# Patient Record
Sex: Female | Born: 1965 | Race: White | Hispanic: No | Marital: Married | Smoking: Never smoker
Health system: Southern US, Community
[De-identification: ages and names within clinical notes are randomized; demographics above are authoritative.]

## PROBLEM LIST (undated history)

## (undated) DIAGNOSIS — Z8041 Family history of malignant neoplasm of ovary: Secondary | ICD-10-CM

## (undated) DIAGNOSIS — F319 Bipolar disorder, unspecified: Secondary | ICD-10-CM

## (undated) DIAGNOSIS — Z803 Family history of malignant neoplasm of breast: Secondary | ICD-10-CM

## (undated) DIAGNOSIS — T7840XA Allergy, unspecified, initial encounter: Secondary | ICD-10-CM

## (undated) DIAGNOSIS — K635 Polyp of colon: Secondary | ICD-10-CM

## (undated) DIAGNOSIS — Z923 Personal history of irradiation: Secondary | ICD-10-CM

## (undated) DIAGNOSIS — Z8042 Family history of malignant neoplasm of prostate: Secondary | ICD-10-CM

## (undated) DIAGNOSIS — Z8052 Family history of malignant neoplasm of bladder: Secondary | ICD-10-CM

## (undated) DIAGNOSIS — Z0000 Encounter for general adult medical examination without abnormal findings: Secondary | ICD-10-CM

## (undated) DIAGNOSIS — E039 Hypothyroidism, unspecified: Secondary | ICD-10-CM

## (undated) DIAGNOSIS — J45909 Unspecified asthma, uncomplicated: Secondary | ICD-10-CM

## (undated) DIAGNOSIS — Z124 Encounter for screening for malignant neoplasm of cervix: Secondary | ICD-10-CM

## (undated) DIAGNOSIS — E079 Disorder of thyroid, unspecified: Secondary | ICD-10-CM

## (undated) DIAGNOSIS — F419 Anxiety disorder, unspecified: Secondary | ICD-10-CM

## (undated) DIAGNOSIS — Z86010 Personal history of colonic polyps: Secondary | ICD-10-CM

## (undated) HISTORY — PX: SKIN BIOPSY: SHX1

## (undated) HISTORY — PX: APPENDECTOMY: SHX54

## (undated) HISTORY — DX: Family history of malignant neoplasm of breast: Z80.3

## (undated) HISTORY — DX: Encounter for screening for malignant neoplasm of cervix: Z12.4

## (undated) HISTORY — DX: Family history of malignant neoplasm of prostate: Z80.42

## (undated) HISTORY — DX: Family history of malignant neoplasm of bladder: Z80.52

## (undated) HISTORY — DX: Polyp of colon: K63.5

## (undated) HISTORY — DX: Family history of malignant neoplasm of ovary: Z80.41

## (undated) HISTORY — DX: Personal history of colonic polyps: Z86.010

## (undated) HISTORY — DX: Encounter for general adult medical examination without abnormal findings: Z00.00

---

## 2000-12-18 ENCOUNTER — Inpatient Hospital Stay (HOSPITAL_COMMUNITY): Admission: EM | Admit: 2000-12-18 | Discharge: 2000-12-24 | Payer: Self-pay | Admitting: Psychiatry

## 2001-06-28 ENCOUNTER — Other Ambulatory Visit: Admission: RE | Admit: 2001-06-28 | Discharge: 2001-06-28 | Payer: Self-pay | Admitting: Obstetrics and Gynecology

## 2001-09-15 ENCOUNTER — Emergency Department (HOSPITAL_COMMUNITY): Admission: EM | Admit: 2001-09-15 | Discharge: 2001-09-15 | Payer: Self-pay | Admitting: Emergency Medicine

## 2002-02-18 ENCOUNTER — Encounter: Payer: Self-pay | Admitting: Internal Medicine

## 2002-02-18 ENCOUNTER — Encounter: Admission: RE | Admit: 2002-02-18 | Discharge: 2002-02-18 | Payer: Self-pay | Admitting: Internal Medicine

## 2002-02-28 ENCOUNTER — Encounter: Payer: Self-pay | Admitting: Otolaryngology

## 2002-02-28 ENCOUNTER — Ambulatory Visit (HOSPITAL_COMMUNITY): Admission: RE | Admit: 2002-02-28 | Discharge: 2002-02-28 | Payer: Self-pay | Admitting: Otolaryngology

## 2002-11-14 ENCOUNTER — Encounter: Admission: RE | Admit: 2002-11-14 | Discharge: 2002-11-14 | Payer: Self-pay | Admitting: Internal Medicine

## 2003-04-27 ENCOUNTER — Encounter: Admission: RE | Admit: 2003-04-27 | Discharge: 2003-04-27 | Payer: Self-pay | Admitting: Internal Medicine

## 2003-10-30 ENCOUNTER — Encounter: Admission: RE | Admit: 2003-10-30 | Discharge: 2003-10-30 | Payer: Self-pay | Admitting: Internal Medicine

## 2005-03-22 ENCOUNTER — Encounter: Admission: RE | Admit: 2005-03-22 | Discharge: 2005-03-22 | Payer: Self-pay | Admitting: Internal Medicine

## 2005-04-07 ENCOUNTER — Ambulatory Visit (HOSPITAL_COMMUNITY): Admission: RE | Admit: 2005-04-07 | Discharge: 2005-04-07 | Payer: Self-pay | Admitting: Internal Medicine

## 2005-09-09 ENCOUNTER — Emergency Department (HOSPITAL_COMMUNITY): Admission: EM | Admit: 2005-09-09 | Discharge: 2005-09-09 | Payer: Self-pay | Admitting: Emergency Medicine

## 2009-10-29 ENCOUNTER — Encounter: Admission: RE | Admit: 2009-10-29 | Discharge: 2009-10-29 | Payer: Self-pay | Admitting: Family Medicine

## 2010-02-06 ENCOUNTER — Encounter: Payer: Self-pay | Admitting: Allergy

## 2010-02-06 ENCOUNTER — Encounter: Payer: Self-pay | Admitting: Family Medicine

## 2010-06-03 NOTE — H&P (Signed)
[facility]  Patient:    Cheyenne Garner, Cheyenne Garner Visit Number: [id number] MRN: [id number]          Service Type: PSY Location: 400 0400 02 Attending Physician:  Rachael Fee Dictated by:   Candi Leash. Orsini, N.P. Admit Date:  12/18/2000                     Psychiatric Admission Assessment  PATIENT IDENTIFICATION:  This is a 45 year old married white female involuntarily committed for mania and psychosis.  HISTORY OF PRESENT ILLNESS:  The patient presents with a history of manic behavior.  Yesterday the patient states she got in her pajamas.  She went out and was singing Loralie Champagne to her neighbors.  She states her neighbors were very supportive.  The patient states she has been hearing voices telling her to get better.  She has a history of depressive symptoms.  The patient denies any suicidal or homicidal ideation.  She does feel like she lost control yesterday.  She reports she has not been sleeping for the past two nights.  Her appetite has been satisfactory.  The patient feels very afraid for the past few days, recently moved from [place] to [place] due to husbands job change.  The patient wants to go back to [place].  She reports that she has been compliant with her medications.  She does report that she feels very anxious and has been picking at herself.  PAST PSYCHIATRIC HISTORY:  First visit to [facility], last hospitalization was about four months after she delivered for psychosis.  She was seeing Dr. Darin Engels in [place] and the patient has a history of bipolar disorder that was diagnosed about four years ago.  SUBSTANCE ABUSE HISTORY:  Nonsmoker, denies any alcohol or drug use.  PAST MEDICAL HISTORY:  Primary care Trejuan Matherne: At present is none.  Past psychiatrist is Dr. Darin Engels; phone number [phone].  Medical problems: None.  MEDICATIONS: 1. Prozac 20 mg q.h.s. 2. Eskalith 450 mg b.i.d.  DRUG ALLERGIES:   ERYTHROMYCIN.  PHYSICAL EXAMINATION:  GENERAL:  Performed at [facility] Emergency Department.  LABORATORY DATA:  CBC and CMET were within normal limits.  SOCIAL HISTORY:  She is a 45 year old married white female, married for 13 years.  She has a 30-year-old child.  She lives with her husband and child. She is unemployed, no legal problems.  She moved to [place] two months ago from [place].  FAMILY HISTORY:  None.  MENTAL STATUS EXAMINATION:  She is alert, young middle-aged, dressed in night wear.  She is lying in bed.  She is cooperative with good eye contact.  Speech is normal and relevant.  Mood is depressed.  Affect is labile  Thought processes: Positive paranoia, no auditory or visual hallucinations, no suicidal or homicidal ideations.  The patient feels confused and uncertain if she is giving me right answers.  Cognitive: Intact.  Judgment is fair. Insight is fair.  She appears sincere.  ADMISSION DIAGNOSES: Axis I:    Bipolar disorder, hypomanic. Axis II:   Deferred. Axis III:  Recent upper respiratory infection. Axis IV:   Recent relocation. Axis V:    Current is 25, estimated this past year is 71.  INITIAL PLAN OF CARE:  Plan is an involuntary commitment to [facility] for psychosis.  Contract for safety.  Check every 15 minutes.  Will put the patient on a locked hall.   Will resume her routine medications.  Will obtain a  lithium level, other labs as indicated, and a urine pregnancy test. Will add Zyprexa and have Klonopin for anxiety and discontinue her Prozac. Will consider a family session.  Goal is to stabilize her mood and thinking so the patient can be safe, to follow up with mental health and private psychiatrist, for the patient to medication compliant.  ESTIMATED LENGTH OF STAY:  Three to five days. Dictated by:   Candi Leash. Orsini, N.P. Attending Physician:  Rachael Fee DD:  12/21/00 TD:  12/21/00 Job: [id number]

## 2010-06-03 NOTE — Discharge Summary (Signed)
[facility]  Patient:    Cheyenne Garner, Cheyenne Garner Visit Number: [id number] MRN: [id number]          Service Type: PSY Location: 400 0400 02 Attending Physician:  Rachael Fee Dictated by:   Reymundo Poll Dub Mikes, M.D. Admit Date:  12/18/2000 Discharge Date: 12/24/2000                             Discharge Summary  CHIEF COMPLAINT AND PRESENT ILLNESS:  This was the first admission to [facility] for this 45 year old female involuntarily committed for mania and psychosis.  History of manic behavior.  She got into her pajamas. She went out and was singing Loralie Champagne to her neighbors.  She stated her neighbors were very supportive.  The patient said she had been hearing voices telling her to get better.  Has a history of depressive symptoms.  The patient denies any suicidal ideation.  She has been sleeping for the past two nights prior to this admission.  Her appetite has been satisfactory, very afraid for the last few days, recently moved from [place] to [place] due to husbands job change.  The patient wants to go back to [place].  She reports that she has been compliant with her medications.  She does report that she feels very anxious.  She has been picking at herself.  PAST PSYCHIATRIC HISTORY:  First time at [facility].  Last hospitalization was about four months after she delivered for psychosis.  Was seeing a Dr. ________ in [place].  Past history of bipolar disorder.  SUBSTANCE ABUSE HISTORY:  Denies the use or abuse of any substances.  PAST MEDICAL HISTORY:  Noncontributory.  MEDICATIONS:  Upon admission, she was taking Prozac 20 mg at bedtime and Eskalith CR 450 mg per day.  MENTAL STATUS EXAMINATION:  Alert, young female dressed in night-wear.  She is lying in bed.  Cooperative.  Good eye contact.  Speech was normal and relevant.  Mood was depressed.  Affect was labile.  Thought process positive for paranoia.  No  auditory or visual hallucinations.  No suicidal or homicidal ideation.  Feels confused and it is uncertain that she is giving the right answers.  Cognition well-preserved.  ADMISSION DIAGNOSES: Axis I:    Bipolar disorder, hypomanic. Axis II:   Deferred. Axis III:  Recent upper respiratory tract infection. Axis IV:   Moderate. Axis V:    Global Assessment of Functioning upon admission 25; highest Global            Assessment of Functioning in the last year 69.  LABORATORY DATA:  Blood chemistries were within normal limits.  CBC was within normal limits.  Thyroid profile was within normal limits.  Lithium level initially 0.59; went up to 0.75 towards the end of the hospitalization.  HOSPITAL COURSE:  As she was started on intensive individual and group psychotherapy, her behavior was pretty erratic at times, very expansive, inappropriate, childlike behavior, needing to be redirected.  She wanted to continue lithium, did not want to pursue the Depakote as it made her gain weight.  Her medications were adjusted.  She was given Zyprexa and the lithium was increased.  Eskalith CR 450 mg in the morning and 675 mg at bedtime. Gradually, she came down.  Her mood improved.  Her affect was brighter.  We had a family session with the husband and that went well.  He was supportive but there was  an issue of her being upset because she had lied about her taking the medication.  On December 9th, much improved.  No more regressed episodes.  Cooperative and in full contact with reality.  Euthymic mood. Affect broad, bright.  Willing and motivated to pursue further outpatient follow-up and continue her medication.  DISCHARGE DIAGNOSES: Axis I:    Bipolar disorder, hypomanic. Axis II:   No diagnosis. Axis III:  Respiratory tract infection, resolved. Axis IV:   Moderate. Axis V:    Global Assessment of Functioning upon discharge 55-60.  DISCHARGE MEDICATIONS: 1. Klonopin 0.5 mg twice a day as  needed for anxiety. 2. Zyprexa 5 mg at bedtime. 3. Eskalith CR 450 mg, 1 in the morning, 1-1/2 at bedtime.  FOLLOW-UP:  Will be followed by Dr. Nolen Mu. Dictated by:   Reymundo Poll Dub Mikes, M.D. Attending Physician:  Rachael Fee DD:  01/23/01 TD:  01/25/01 Job: [id number]

## 2010-10-28 ENCOUNTER — Other Ambulatory Visit: Payer: Self-pay | Admitting: Internal Medicine

## 2010-10-28 DIAGNOSIS — Z1231 Encounter for screening mammogram for malignant neoplasm of breast: Secondary | ICD-10-CM

## 2010-11-01 ENCOUNTER — Ambulatory Visit: Payer: Self-pay

## 2010-11-29 ENCOUNTER — Ambulatory Visit
Admission: RE | Admit: 2010-11-29 | Discharge: 2010-11-29 | Disposition: A | Discharge status: Home / Self Care | Payer: BC Managed Care – PPO | Source: Ambulatory Visit | Attending: Internal Medicine | Admitting: Internal Medicine

## 2010-11-29 DIAGNOSIS — Z1231 Encounter for screening mammogram for malignant neoplasm of breast: Secondary | ICD-10-CM

## 2011-12-25 DIAGNOSIS — R32 Unspecified urinary incontinence: Secondary | ICD-10-CM | POA: Insufficient documentation

## 2012-02-22 ENCOUNTER — Other Ambulatory Visit: Payer: Self-pay | Admitting: Nurse Practitioner

## 2012-02-22 DIAGNOSIS — Z1231 Encounter for screening mammogram for malignant neoplasm of breast: Secondary | ICD-10-CM

## 2012-02-29 ENCOUNTER — Ambulatory Visit: Payer: BC Managed Care – PPO

## 2012-03-07 ENCOUNTER — Ambulatory Visit: Payer: BC Managed Care – PPO

## 2014-06-25 ENCOUNTER — Emergency Department (HOSPITAL_BASED_OUTPATIENT_CLINIC_OR_DEPARTMENT_OTHER)
Admission: EM | Admit: 2014-06-25 | Discharge: 2014-06-25 | Disposition: A | Discharge status: Home / Self Care | Payer: BLUE CROSS/BLUE SHIELD | Attending: Emergency Medicine | Admitting: Emergency Medicine

## 2014-06-25 ENCOUNTER — Emergency Department (HOSPITAL_BASED_OUTPATIENT_CLINIC_OR_DEPARTMENT_OTHER): Payer: BLUE CROSS/BLUE SHIELD

## 2014-06-25 ENCOUNTER — Encounter (HOSPITAL_BASED_OUTPATIENT_CLINIC_OR_DEPARTMENT_OTHER): Payer: Self-pay | Admitting: *Deleted

## 2014-06-25 DIAGNOSIS — R1013 Epigastric pain: Secondary | ICD-10-CM | POA: Insufficient documentation

## 2014-06-25 DIAGNOSIS — Z79899 Other long term (current) drug therapy: Secondary | ICD-10-CM | POA: Insufficient documentation

## 2014-06-25 DIAGNOSIS — J45909 Unspecified asthma, uncomplicated: Secondary | ICD-10-CM | POA: Diagnosis not present

## 2014-06-25 DIAGNOSIS — F319 Bipolar disorder, unspecified: Secondary | ICD-10-CM | POA: Diagnosis not present

## 2014-06-25 DIAGNOSIS — R1011 Right upper quadrant pain: Secondary | ICD-10-CM | POA: Diagnosis not present

## 2014-06-25 DIAGNOSIS — Z3202 Encounter for pregnancy test, result negative: Secondary | ICD-10-CM | POA: Insufficient documentation

## 2014-06-25 DIAGNOSIS — E079 Disorder of thyroid, unspecified: Secondary | ICD-10-CM | POA: Diagnosis not present

## 2014-06-25 DIAGNOSIS — Z7951 Long term (current) use of inhaled steroids: Secondary | ICD-10-CM | POA: Diagnosis not present

## 2014-06-25 HISTORY — DX: Unspecified asthma, uncomplicated: J45.909

## 2014-06-25 HISTORY — DX: Disorder of thyroid, unspecified: E07.9

## 2014-06-25 HISTORY — DX: Bipolar disorder, unspecified: F31.9

## 2014-06-25 LAB — URINE MICROSCOPIC-ADD ON

## 2014-06-25 LAB — URINALYSIS, ROUTINE W REFLEX MICROSCOPIC
Bilirubin Urine: NEGATIVE
GLUCOSE, UA: NEGATIVE mg/dL
KETONES UR: NEGATIVE mg/dL
NITRITE: NEGATIVE
PROTEIN: NEGATIVE mg/dL
Specific Gravity, Urine: 1.029 (ref 1.005–1.030)
UROBILINOGEN UA: 0.2 mg/dL (ref 0.0–1.0)
pH: 5.5 (ref 5.0–8.0)

## 2014-06-25 LAB — CBC WITH DIFFERENTIAL/PLATELET
Basophils Absolute: 0 10*3/uL (ref 0.0–0.1)
Basophils Relative: 0 % (ref 0–1)
Eosinophils Absolute: 0.1 10*3/uL (ref 0.0–0.7)
Eosinophils Relative: 1 % (ref 0–5)
HCT: 39.8 % (ref 36.0–46.0)
Hemoglobin: 13 g/dL (ref 12.0–15.0)
Lymphocytes Relative: 12 % (ref 12–46)
Lymphs Abs: 1.4 10*3/uL (ref 0.7–4.0)
MCH: 29.5 pg (ref 26.0–34.0)
MCHC: 32.7 g/dL (ref 30.0–36.0)
MCV: 90.5 fL (ref 78.0–100.0)
Monocytes Absolute: 0.8 10*3/uL (ref 0.1–1.0)
Monocytes Relative: 7 % (ref 3–12)
Neutro Abs: 9.3 10*3/uL — ABNORMAL HIGH (ref 1.7–7.7)
Neutrophils Relative %: 80 % — ABNORMAL HIGH (ref 43–77)
Platelets: 122 10*3/uL — ABNORMAL LOW (ref 150–400)
RBC: 4.4 MIL/uL (ref 3.87–5.11)
RDW: 12.7 % (ref 11.5–15.5)
WBC: 11.6 10*3/uL — ABNORMAL HIGH (ref 4.0–10.5)

## 2014-06-25 LAB — LITHIUM LEVEL: Lithium Lvl: 0.38 mmol/L — ABNORMAL LOW (ref 0.60–1.20)

## 2014-06-25 LAB — COMPREHENSIVE METABOLIC PANEL
ALT: 17 U/L (ref 14–54)
AST: 16 U/L (ref 15–41)
Albumin: 4.3 g/dL (ref 3.5–5.0)
Alkaline Phosphatase: 61 U/L (ref 38–126)
Anion gap: 5 (ref 5–15)
BUN: 20 mg/dL (ref 6–20)
CO2: 29 mmol/L (ref 22–32)
Calcium: 9.4 mg/dL (ref 8.9–10.3)
Chloride: 102 mmol/L (ref 101–111)
Creatinine, Ser: 0.87 mg/dL (ref 0.44–1.00)
GFR calc Af Amer: >60 mL/min (ref >60)
GFR calc non Af Amer: >60 mL/min (ref >60)
Glucose, Bld: 106 mg/dL — ABNORMAL HIGH (ref 65–99)
Potassium: 3.7 mmol/L (ref 3.5–5.1)
Sodium: 136 mmol/L (ref 135–145)
Total Bilirubin: 0.7 mg/dL (ref 0.3–1.2)
Total Protein: 7.2 g/dL (ref 6.5–8.1)

## 2014-06-25 LAB — LIPASE, BLOOD: Lipase: 20 U/L — ABNORMAL LOW (ref 22–51)

## 2014-06-25 LAB — TROPONIN I

## 2014-06-25 LAB — PREGNANCY, URINE: Preg Test, Ur: NEGATIVE

## 2014-06-25 MED ORDER — KETOROLAC TROMETHAMINE 30 MG/ML IJ SOLN
30.0000 mg | Freq: Once | INTRAMUSCULAR | Status: AC
  Administered 2014-06-25: 30 mg via INTRAVENOUS
  Filled 2014-06-25: qty 1

## 2014-06-25 MED ORDER — HYDROCODONE-ACETAMINOPHEN 5-325 MG PO TABS: 1.0000 | ORAL_TABLET | ORAL | Status: DC | PRN

## 2014-06-25 MED ORDER — PANTOPRAZOLE SODIUM 40 MG PO TBEC: 40.0000 mg | DELAYED_RELEASE_TABLET | Freq: Every day | ORAL | Status: DC

## 2014-06-25 MED ORDER — SODIUM CHLORIDE 0.9 % IV SOLN
INTRAVENOUS | Status: DC
  Administered 2014-06-25: 20:00:00 via INTRAVENOUS

## 2014-06-25 MED ORDER — ONDANSETRON 4 MG PO TBDP: 4.0000 mg | ORAL_TABLET | Freq: Three times a day (TID) | ORAL | Status: DC | PRN

## 2014-06-25 NOTE — ED Notes (Signed)
MD at bedside. 

## 2014-06-25 NOTE — Discharge Instructions (Signed)
Gastritis, Adult Gastritis is soreness and swelling (inflammation) of the lining of the stomach. Gastritis can develop as a sudden onset (acute) or long-term (chronic) condition. If gastritis is not treated, it can lead to stomach bleeding and ulcers. CAUSES  Gastritis occurs when the stomach lining is weak or damaged. Digestive juices from the stomach then inflame the weakened stomach lining. The stomach lining may be weak or damaged due to viral or bacterial infections. One common bacterial infection is the Helicobacter pylori infection. Gastritis can also result from excessive alcohol consumption, taking certain medicines, or having too much acid in the stomach.  SYMPTOMS  In some cases, there are no symptoms. When symptoms are present, they may include:  Pain or a burning sensation in the upper abdomen.  Nausea.  Vomiting.  An uncomfortable feeling of fullness after eating. DIAGNOSIS  Your caregiver may suspect you have gastritis based on your symptoms and a physical exam. To determine the cause of your gastritis, your caregiver may perform the following:  Blood or stool tests to check for the H pylori bacterium.  Gastroscopy. A thin, flexible tube (endoscope) is passed down the esophagus and into the stomach. The endoscope has a light and camera on the end. Your caregiver uses the endoscope to view the inside of the stomach.  Taking a tissue sample (biopsy) from the stomach to examine under a microscope. TREATMENT  Depending on the cause of your gastritis, medicines may be prescribed. If you have a bacterial infection, such as an H pylori infection, antibiotics may be given. If your gastritis is caused by too much acid in the stomach, H2 blockers or antacids may be given. Your caregiver may recommend that you stop taking aspirin, ibuprofen, or other nonsteroidal anti-inflammatory drugs (NSAIDs). HOME CARE INSTRUCTIONS  Only take over-the-counter or prescription medicines as directed by  your caregiver.  If you were given antibiotic medicines, take them as directed. Finish them even if you start to feel better.  Drink enough fluids to keep your urine clear or pale yellow.  Avoid foods and drinks that make your symptoms worse, such as:  Caffeine or alcoholic drinks.  Chocolate.  Peppermint or mint flavorings.  Garlic and onions.  Spicy foods.  Citrus fruits, such as oranges, lemons, or limes.  Tomato-based foods such as sauce, chili, salsa, and pizza.  Fried and fatty foods.  Eat small, frequent meals instead of large meals. SEEK IMMEDIATE MEDICAL CARE IF:   You have black or dark red stools.  You vomit blood or material that looks like coffee grounds.  You are unable to keep fluids down.  Your abdominal pain gets worse.  You have a fever.  You do not feel better after 1 week.  You have any other questions or concerns. MAKE SURE YOU:  Understand these instructions.  Will watch your condition.  Will get help right away if you are not doing well or get worse. Document Released: 12/27/2000 Document Revised: 07/04/2011 Document Reviewed: 02/15/2011 [facility] Patient Information 2015 [place], [place]. This information is not intended to replace advice given to you by your health care provider. Make sure you discuss any questions you have with your health care provider.    Food Choices for Gastroesophageal Reflux Disease When you have gastroesophageal reflux disease (GERD), the foods you eat and your eating habits are very important. Choosing the right foods can help ease the discomfort of GERD. WHAT GENERAL GUIDELINES DO I NEED TO FOLLOW?  Choose fruits, vegetables, whole grains, low-fat dairy products,  and low-fat meat, fish, and poultry.  Limit fats such as oils, salad dressings, butter, nuts, and avocado.  Keep a food diary to identify foods that cause symptoms.  Avoid foods that cause reflux. These may be different for different  people.  Eat frequent small meals instead of three large meals each day.  Eat your meals slowly, in a relaxed setting.  Limit fried foods.  Cook foods using methods other than frying.  Avoid drinking alcohol.  Avoid drinking large amounts of liquids with your meals.  Avoid NSAIDs such as aspirin, ibuprofen, Aleve.  Avoid bending over or lying down until 2-3 hours after eating. WHAT FOODS ARE NOT RECOMMENDED? The following are some foods and drinks that may worsen your symptoms: Vegetables Tomatoes. Tomato juice. Tomato and spaghetti sauce. Chili peppers. Onion and garlic. Horseradish. Fruits Oranges, grapefruit, and lemon (fruit and juice). Meats High-fat meats, fish, and poultry. This includes hot dogs, ribs, ham, sausage, salami, and bacon. Dairy Whole milk and chocolate milk. Sour cream. Cream. Butter. Ice cream. Cream cheese.  Beverages Coffee and tea, with or without caffeine. Carbonated beverages or energy drinks. Condiments Hot sauce. Barbecue sauce.  Sweets/Desserts Chocolate and cocoa. Donuts. Peppermint and spearmint. Fats and Oils High-fat foods, including [place] fries and potato chips. Other Vinegar. Strong spices, such as black pepper, white pepper, red pepper, cayenne, curry powder, cloves, ginger, and chili powder. The items listed above may not be a complete list of foods and beverages to avoid. Contact your dietitian for more information. Document Released: 01/02/2005 Document Revised: 01/07/2013 Document Reviewed: 11/06/2012 [facility] Patient Information 2015 [place], [place]. This information is not intended to replace advice given to you by your health care provider. Make sure you discuss any questions you have with your health care provider.

## 2014-06-25 NOTE — ED Notes (Signed)
Presents with epigastric pain, radiates to rt side of neck and rt shoulder. Onset approx 5pm today. Denies SOB, also denies any N/V. States while sitting pain is now beginning to decrease.

## 2014-06-25 NOTE — ED Provider Notes (Signed)
TIME SEEN: 7:15 PM  CHIEF COMPLAINT: Abdominal pain  HPI: Pt is a 49 y.o. female with history of hypothyroidism, bipolar disorder on lithium, asthma who presents to the emergency department with epigastric and right upper quadrant abdominal pain that started at 5 PM. Pain radiates into the right shoulder. No chest pain or shortness of breath. No nausea, vomiting or diarrhea. No bloody stools or melena. No dysuria, hematuria, vaginal bleeding or discharge. Last menstrual period was May 2015. She has had a previous appendectomy but no other abdominal surgeries. She states that she has pain similar to this the past after eating fatty meals and thought that this was her gallbladder but has never had an ultrasound of her gallbladder. Denies history of peptic ulcer disease or endoscopy. States she did eat an omelette last night as well as home fries and custard. She did not have pain after this meal. States for breakfast this morning she ate granola and then at lunch she ate tuna and carrots.  ROS: See HPI Constitutional: no fever  Eyes: no drainage  ENT: no runny nose   Cardiovascular:  no chest pain  Resp: no SOB  GI: no vomiting GU: no dysuria Integumentary: no rash  Allergy: no hives  Musculoskeletal: no leg swelling  Neurological: no slurred speech ROS otherwise negative  PAST MEDICAL HISTORY/PAST SURGICAL HISTORY:  Past Medical History  Diagnosis Date  . Thyroid disease   . Bipolar 1 disorder   . Asthma     MEDICATIONS:  Prior to Admission medications   Medication Sig Start Date End Date Taking? Authorizing Provider  beclomethasone (QVAR) 80 MCG/ACT inhaler Inhale 2 puffs into the lungs 2 (two) times daily.   Yes Historical Provider, MD  budesonide-formoterol (SYMBICORT) 160-4.5 MCG/ACT inhaler Inhale 2 puffs into the lungs 2 (two) times daily.   Yes Historical Provider, MD  levothyroxine (SYNTHROID, LEVOTHROID) 125 MCG tablet Take 125 mcg by mouth daily before breakfast.   Yes  Historical Provider, MD  lithium 300 MG tablet Take 300 mg by mouth 2 (two) times daily.   Yes Historical Provider, MD    ALLERGIES:  No Known Allergies  SOCIAL HISTORY:  History  Substance Use Topics  . Smoking status: Never Smoker   . Smokeless tobacco: Not on file  . Alcohol Use: No    FAMILY HISTORY: History reviewed. No pertinent family history.  EXAM: BP 129/81 mmHg  Pulse 77  Temp(Src) 98.3 F (36.8 C) (Oral)  Resp 16  Ht 5' 6.5" (1.689 m)  Wt 140 lb (63.504 kg)  BMI 22.26 kg/m2  SpO2 100% CONSTITUTIONAL: Alert and oriented and responds appropriately to questions. Well-appearing; well-nourished HEAD: Normocephalic EYES: Conjunctivae clear, PERRL ENT: normal nose; no rhinorrhea; moist mucous membranes; pharynx without lesions noted NECK: Supple, no meningismus, no LAD  CARD: RRR; S1 and S2 appreciated; no murmurs, no clicks, no rubs, no gallops RESP: Normal chest excursion without splinting or tachypnea; breath sounds clear and equal bilaterally; no wheezes, no rhonchi, no rales, no hypoxia or respiratory distress, speaking full sentences ABD/GI: Normal bowel sounds; non-distended; soft, tender to palpation in the epigastric region and right upper quadrant with minimal voluntary guarding, no rebound, no peritoneal signs, negative Murphy sign BACK:  The back appears normal and is non-tender to palpation, there is no CVA tenderness EXT: Normal ROM in all joints; non-tender to palpation; no edema; normal capillary refill; no cyanosis, no calf tenderness or swelling    SKIN: Normal color for age and race; warm NEURO: Moves  all extremities equally, sensation to light touch intact diffusely, cranial nerves II through XII intact PSYCH: The patient's mood and manner are appropriate. Grooming and personal hygiene are appropriate.  MEDICAL DECISION MAKING: Patient here with abdominal pain and epigastric region and right upper quadrant. She is afebrile, nontoxic appearing, in no  distress. Will obtain abdominal labs, urine and a right upper quadrant ultrasound. Differential includes cholelithiasis, cholecystitis, pancreatitis, gastritis, GERD, peptic ulcer. Patient does not want narcotic pain medication at this time. We'll give Toradol.  ED PROGRESS: Patient's labs show mild leukocytosis of 11.6 with left shift. LFTs, lipase normal. Troponin negative. Urine pregnancy negative. Urine shows trace hemoglobin small leukocytes but rare bacteria and she is not having any urinary symptoms. Her abdominal ultrasound shows no acute abnormality. Negative for cholelithiasis or cholecystitis. Suspect this may be GERD, gastritis. We'll start her on Protonix and have recommended outpatient follow-up with gastroenterology. Discussed return precautions. She verbalized understanding and is comfortable with plan. She reports feeling much better and now her pain is a 1/10. She is safe to be discharged home and does not need any further emergent workup.    EKG Interpretation  Date/Time:  Thursday June 25 2014 19:31:28 EDT Ventricular Rate:  74 PR Interval:  162 QRS Duration: 106 QT Interval:  372 QTC Calculation: 412 R Axis:   85 Text Interpretation:  Normal sinus rhythm Incomplete right bundle branch block Borderline ECG No old tracing to compare Confirmed by WARD,  DO, KRISTEN [phone]) on 06/25/2014 7:37:15 PM          [place], DO 06/25/14 2125

## 2014-06-25 NOTE — ED Notes (Signed)
Pt c/o epigastric pain that radiates through to back   X 4 hrs

## 2014-07-06 ENCOUNTER — Other Ambulatory Visit (HOSPITAL_COMMUNITY): Payer: Self-pay | Admitting: Physician Assistant

## 2014-07-06 DIAGNOSIS — R1011 Right upper quadrant pain: Secondary | ICD-10-CM

## 2014-07-06 DIAGNOSIS — R1013 Epigastric pain: Secondary | ICD-10-CM

## 2014-07-13 ENCOUNTER — Encounter (HOSPITAL_COMMUNITY): Payer: BLUE CROSS/BLUE SHIELD

## 2014-07-24 ENCOUNTER — Ambulatory Visit (HOSPITAL_COMMUNITY)
Admission: RE | Admit: 2014-07-24 | Discharge: 2014-07-24 | Disposition: A | Discharge status: Home / Self Care | Payer: BLUE CROSS/BLUE SHIELD | Source: Ambulatory Visit | Attending: Physician Assistant | Admitting: Physician Assistant

## 2014-07-24 DIAGNOSIS — R1011 Right upper quadrant pain: Secondary | ICD-10-CM | POA: Diagnosis present

## 2014-07-24 DIAGNOSIS — R1013 Epigastric pain: Secondary | ICD-10-CM | POA: Insufficient documentation

## 2014-07-24 MED ORDER — STERILE WATER FOR INJECTION IJ SOLN
INTRAMUSCULAR | Status: AC
  Filled 2014-07-24: qty 10

## 2014-07-24 MED ORDER — SINCALIDE 5 MCG IJ SOLR
0.0200 ug/kg | Freq: Once | INTRAMUSCULAR | Status: AC
  Administered 2014-07-24: 1.27 ug via INTRAVENOUS

## 2014-07-24 MED ORDER — SINCALIDE 5 MCG IJ SOLR
INTRAMUSCULAR | Status: AC
  Filled 2014-07-24: qty 5

## 2014-07-24 MED ORDER — TECHNETIUM TC 99M SULFUR COLLOID FILTERED
1.0000 | Freq: Once | INTRAVENOUS | Status: AC | PRN
Start: 2014-07-24 — End: 2014-07-24
  Administered 2014-07-24: 1 via INTRADERMAL

## 2015-05-17 DIAGNOSIS — H524 Presbyopia: Secondary | ICD-10-CM | POA: Diagnosis not present

## 2015-05-17 DIAGNOSIS — H5319 Other subjective visual disturbances: Secondary | ICD-10-CM | POA: Diagnosis not present

## 2015-06-21 DIAGNOSIS — F3173 Bipolar disorder, in partial remission, most recent episode manic: Secondary | ICD-10-CM | POA: Diagnosis not present

## 2015-07-22 ENCOUNTER — Telehealth: Payer: Self-pay | Admitting: *Deleted

## 2015-07-22 NOTE — Telephone Encounter (Signed)
error:[id number] ° °

## 2015-07-22 NOTE — Telephone Encounter (Signed)
Unable to reach patient at time of Pre-Visit Call.  Left message for patient to return call when available.    

## 2015-07-23 ENCOUNTER — Encounter: Payer: Self-pay | Admitting: Family Medicine

## 2015-07-23 ENCOUNTER — Ambulatory Visit (INDEPENDENT_AMBULATORY_CARE_PROVIDER_SITE_OTHER): Payer: BLUE CROSS/BLUE SHIELD | Admitting: Family Medicine

## 2015-07-23 ENCOUNTER — Ambulatory Visit: Payer: BLUE CROSS/BLUE SHIELD | Admitting: Family Medicine

## 2015-07-23 ENCOUNTER — Encounter (INDEPENDENT_AMBULATORY_CARE_PROVIDER_SITE_OTHER): Payer: Self-pay

## 2015-07-23 VITALS — BP 120/78 | HR 79 | Temp 98.1°F | Ht 66.5 in | Wt 151.0 lb

## 2015-07-23 DIAGNOSIS — Z0000 Encounter for general adult medical examination without abnormal findings: Secondary | ICD-10-CM | POA: Diagnosis not present

## 2015-07-23 DIAGNOSIS — F319 Bipolar disorder, unspecified: Secondary | ICD-10-CM | POA: Diagnosis not present

## 2015-07-23 DIAGNOSIS — J45909 Unspecified asthma, uncomplicated: Secondary | ICD-10-CM | POA: Diagnosis not present

## 2015-07-23 DIAGNOSIS — Z1239 Encounter for other screening for malignant neoplasm of breast: Secondary | ICD-10-CM | POA: Diagnosis not present

## 2015-07-23 DIAGNOSIS — E079 Disorder of thyroid, unspecified: Secondary | ICD-10-CM | POA: Insufficient documentation

## 2015-07-23 HISTORY — DX: Encounter for general adult medical examination without abnormal findings: Z00.00

## 2015-07-23 LAB — CBC
HCT: 41.7 % (ref 36.0–46.0)
Hemoglobin: 14 g/dL (ref 12.0–15.0)
MCHC: 33.6 g/dL (ref 30.0–36.0)
MCV: 88.4 fl (ref 78.0–100.0)
Platelets: 163 10*3/uL (ref 150.0–400.0)
RBC: 4.71 Mil/uL (ref 3.87–5.11)
RDW: 12.7 % (ref 11.5–15.5)
WBC: 5 10*3/uL (ref 4.0–10.5)

## 2015-07-23 LAB — COMPREHENSIVE METABOLIC PANEL
ALT: 14 U/L (ref 0–35)
AST: 16 U/L (ref 0–37)
Albumin: 4.2 g/dL (ref 3.5–5.2)
Alkaline Phosphatase: 65 U/L (ref 39–117)
BILIRUBIN TOTAL: 0.7 mg/dL (ref 0.2–1.2)
BUN: 18 mg/dL (ref 6–23)
CO2: 33 meq/L — AB (ref 19–32)
Calcium: 9.7 mg/dL (ref 8.4–10.5)
Chloride: 104 mEq/L (ref 96–112)
Creatinine, Ser: 0.89 mg/dL (ref 0.40–1.20)
GFR: 71.24 mL/min (ref >60.00)
GLUCOSE: 85 mg/dL (ref 70–99)
Potassium: 4.3 mEq/L (ref 3.5–5.1)
Sodium: 139 mEq/L (ref 135–145)
TOTAL PROTEIN: 6.7 g/dL (ref 6.0–8.3)

## 2015-07-23 LAB — LIPID PANEL
CHOL/HDL RATIO: 3
Cholesterol: 176 mg/dL (ref 0–200)
HDL: 68.4 mg/dL (ref >39.00)
LDL Cholesterol: 95 mg/dL (ref 0–99)
NONHDL: 107.8
Triglycerides: 65 mg/dL (ref 0.0–149.0)
VLDL: 13 mg/dL (ref 0.0–40.0)

## 2015-07-23 LAB — TSH: TSH: 3.8 u[IU]/mL (ref 0.35–4.50)

## 2015-07-23 NOTE — Assessment & Plan Note (Signed)
Follows with [facility], Dr Julien Nordmann, they manage her Nature-Throid

## 2015-07-23 NOTE — Assessment & Plan Note (Addendum)
Cough variant asthma a couple of years ago. Has recurred occasionally. Started a noninflammatory diet helped some but not enough, has done with Qvar regularly and then add Symbicort. Flares with flu shot, symbicort 2 weeks prior and after shot helps, encouraged to start Probiotic daily. Uses Albuterol infrequently

## 2015-07-23 NOTE — Progress Notes (Signed)
Pre visit review using our clinic review tool, if applicable. No additional management support is needed unless otherwise documented below in the visit note. 

## 2015-07-23 NOTE — Patient Instructions (Addendum)
[facility] for [facility]  Preventive Care for Adults, Female A healthy lifestyle and preventive care can promote health and wellness. Preventive health guidelines for women include the following key practices.  A routine yearly physical is a good way to check with your health care provider about your health and preventive screening. It is a chance to share any concerns and updates on your health and to receive a thorough exam.  Visit your dentist for a routine exam and preventive care every 6 months. Brush your teeth twice a day and floss once a day. Good oral hygiene prevents tooth decay and gum disease.  The frequency of eye exams is based on your age, health, family medical history, use of contact lenses, and other factors. Follow your health care provider's recommendations for frequency of eye exams.  Eat a healthy diet. Foods like vegetables, fruits, whole grains, low-fat dairy products, and lean protein foods contain the nutrients you need without too many calories. Decrease your intake of foods high in solid fats, added sugars, and salt. Eat the right amount of calories for you.Get information about a proper diet from your health care provider, if necessary.  Regular physical exercise is one of the most important things you can do for your health. Most adults should get at least 150 minutes of moderate-intensity exercise (any activity that increases your heart rate and causes you to sweat) each week. In addition, most adults need muscle-strengthening exercises on 2 or more days a week.  Maintain a healthy weight. The body mass index (BMI) is a screening tool to identify possible weight problems. It provides an estimate of body fat based on height and weight. Your health care provider can find your BMI and can help you achieve or maintain a healthy weight.For adults 20 years and older:  A BMI below 18.5 is considered underweight.  A BMI of 18.5 to 24.9 is normal.  A BMI of 25 to 29.9  is considered overweight.  A BMI of 30 and above is considered obese.  Maintain normal blood lipids and cholesterol levels by exercising and minimizing your intake of saturated fat. Eat a balanced diet with plenty of fruit and vegetables. Blood tests for lipids and cholesterol should begin at age 86 and be repeated every 5 years. If your lipid or cholesterol levels are high, you are over 50, or you are at high risk for heart disease, you may need your cholesterol levels checked more frequently.Ongoing high lipid and cholesterol levels should be treated with medicines if diet and exercise are not working.  If you smoke, find out from your health care provider how to quit. If you do not use tobacco, do not start.  Lung cancer screening is recommended for adults aged 38-80 years who are at high risk for developing lung cancer because of a history of smoking. A yearly low-dose CT scan of the lungs is recommended for people who have at least a 30-pack-year history of smoking and are a current smoker or have quit within the past 15 years. A pack year of smoking is smoking an average of 1 pack of cigarettes a day for 1 year (for example: 1 pack a day for 30 years or 2 packs a day for 15 years). Yearly screening should continue until the smoker has stopped smoking for at least 15 years. Yearly screening should be stopped for people who develop a health problem that would prevent them from having lung cancer treatment.  If you are pregnant, do  not drink alcohol. If you are breastfeeding, be very cautious about drinking alcohol. If you are not pregnant and choose to drink alcohol, do not have more than 1 drink per day. One drink is considered to be 12 ounces (355 mL) of beer, 5 ounces (148 mL) of wine, or 1.5 ounces (44 mL) of liquor.  Avoid use of street drugs. Do not share needles with anyone. Ask for help if you need support or instructions about stopping the use of drugs.  High blood pressure causes heart  disease and increases the risk of stroke. Your blood pressure should be checked at least every 1 to 2 years. Ongoing high blood pressure should be treated with medicines if weight loss and exercise do not work.  If you are 1-39 years old, ask your health care provider if you should take aspirin to prevent strokes.  Diabetes screening is done by taking a blood sample to check your blood glucose level after you have not eaten for a certain period of time (fasting). If you are not overweight and you do not have risk factors for diabetes, you should be screened once every 3 years starting at age 65. If you are overweight or obese and you are 39-59 years of age, you should be screened for diabetes every year as part of your cardiovascular risk assessment.  Breast cancer screening is essential preventive care for women. You should practice "breast self-awareness." This means understanding the normal appearance and feel of your breasts and may include breast self-examination. Any changes detected, no matter how small, should be reported to a health care provider. Women in their 81s and 30s should have a clinical breast exam (CBE) by a health care provider as part of a regular health exam every 1 to 3 years. After age 41, women should have a CBE every year. Starting at age 23, women should consider having a mammogram (breast X-ray test) every year. Women who have a family history of breast cancer should talk to their health care provider about genetic screening. Women at a high risk of breast cancer should talk to their health care providers about having an MRI and a mammogram every year.  Breast cancer gene (BRCA)-related cancer risk assessment is recommended for women who have family members with BRCA-related cancers. BRCA-related cancers include breast, ovarian, tubal, and peritoneal cancers. Having family members with these cancers may be associated with an increased risk for harmful changes (mutations) in the  breast cancer genes BRCA1 and BRCA2. Results of the assessment will determine the need for genetic counseling and BRCA1 and BRCA2 testing.  Your health care provider may recommend that you be screened regularly for cancer of the pelvic organs (ovaries, uterus, and vagina). This screening involves a pelvic examination, including checking for microscopic changes to the surface of your cervix (Pap test). You may be encouraged to have this screening done every 3 years, beginning at age 63.  For women ages 10-65, health care providers may recommend pelvic exams and Pap testing every 3 years, or they may recommend the Pap and pelvic exam, combined with testing for human papilloma virus (HPV), every 5 years. Some types of HPV increase your risk of cervical cancer. Testing for HPV may also be done on women of any age with unclear Pap test results.  Other health care providers may not recommend any screening for nonpregnant women who are considered low risk for pelvic cancer and who do not have symptoms. Ask your health care provider if a  screening pelvic exam is right for you.  If you have had past treatment for cervical cancer or a condition that could lead to cancer, you need Pap tests and screening for cancer for at least 20 years after your treatment. If Pap tests have been discontinued, your risk factors (such as having a new sexual partner) need to be reassessed to determine if screening should resume. Some women have medical problems that increase the chance of getting cervical cancer. In these cases, your health care provider may recommend more frequent screening and Pap tests.  Colorectal cancer can be detected and often prevented. Most routine colorectal cancer screening begins at the age of 83 years and continues through age 2 years. However, your health care provider may recommend screening at an earlier age if you have risk factors for colon cancer. On a yearly basis, your health care provider may  provide home test kits to check for hidden blood in the stool. Use of a small camera at the end of a tube, to directly examine the colon (sigmoidoscopy or colonoscopy), can detect the earliest forms of colorectal cancer. Talk to your health care provider about this at age 102, when routine screening begins. Direct exam of the colon should be repeated every 5-10 years through age 7 years, unless early forms of precancerous polyps or small growths are found.  People who are at an increased risk for hepatitis B should be screened for this virus. You are considered at high risk for hepatitis B if:  You were born in a country where hepatitis B occurs often. Talk with your health care provider about which countries are considered high risk.  Your parents were born in a high-risk country and you have not received a shot to protect against hepatitis B (hepatitis B vaccine).  You have HIV or AIDS.  You use needles to inject street drugs.  You live with, or have sex with, someone who has hepatitis B.  You get hemodialysis treatment.  You take certain medicines for conditions like cancer, organ transplantation, and autoimmune conditions.  Hepatitis C blood testing is recommended for all people born from [birth year] through 1965 and any individual with known risks for hepatitis C.  Practice safe sex. Use condoms and avoid high-risk sexual practices to reduce the spread of sexually transmitted infections (STIs). STIs include gonorrhea, chlamydia, syphilis, trichomonas, herpes, HPV, and human immunodeficiency virus (HIV). Herpes, HIV, and HPV are viral illnesses that have no cure. They can result in disability, cancer, and death.  You should be screened for sexually transmitted illnesses (STIs) including gonorrhea and chlamydia if:  You are sexually active and are younger than 24 years.  You are older than 24 years and your health care provider tells you that you are at risk for this type of infection.  Your  sexual activity has changed since you were last screened and you are at an increased risk for chlamydia or gonorrhea. Ask your health care provider if you are at risk.  If you are at risk of being infected with HIV, it is recommended that you take a prescription medicine daily to prevent HIV infection. This is called preexposure prophylaxis (PrEP). You are considered at risk if:  You are sexually active and do not regularly use condoms or know the HIV status of your partner(s).  You take drugs by injection.  You are sexually active with a partner who has HIV.  Talk with your health care provider about whether you are at high risk  of being infected with HIV. If you choose to begin PrEP, you should first be tested for HIV. You should then be tested every 3 months for as long as you are taking PrEP.  Osteoporosis is a disease in which the bones lose minerals and strength with aging. This can result in serious bone fractures or breaks. The risk of osteoporosis can be identified using a bone density scan. Women ages 26 years and over and women at risk for fractures or osteoporosis should discuss screening with their health care providers. Ask your health care provider whether you should take a calcium supplement or vitamin D to reduce the rate of osteoporosis.  Menopause can be associated with physical symptoms and risks. Hormone replacement therapy is available to decrease symptoms and risks. You should talk to your health care provider about whether hormone replacement therapy is right for you.  Use sunscreen. Apply sunscreen liberally and repeatedly throughout the day. You should seek shade when your shadow is shorter than you. Protect yourself by wearing long sleeves, pants, a wide-brimmed hat, and sunglasses year round, whenever you are outdoors.  Once a month, do a whole body skin exam, using a mirror to look at the skin on your back. Tell your health care provider of new moles, moles that have  irregular borders, moles that are larger than a pencil eraser, or moles that have changed in shape or color.  Stay current with required vaccines (immunizations).  Influenza vaccine. All adults should be immunized every year.  Tetanus, diphtheria, and acellular pertussis (Td, Tdap) vaccine. Pregnant women should receive 1 dose of Tdap vaccine during each pregnancy. The dose should be obtained regardless of the length of time since the last dose. Immunization is preferred during the 27th-36th week of gestation. An adult who has not previously received Tdap or who does not know her vaccine status should receive 1 dose of Tdap. This initial dose should be followed by tetanus and diphtheria toxoids (Td) booster doses every 10 years. Adults with an unknown or incomplete history of completing a 3-dose immunization series with Td-containing vaccines should begin or complete a primary immunization series including a Tdap dose. Adults should receive a Td booster every 10 years.  Varicella vaccine. An adult without evidence of immunity to varicella should receive 2 doses or a second dose if she has previously received 1 dose. Pregnant females who do not have evidence of immunity should receive the first dose after pregnancy. This first dose should be obtained before leaving the health care facility. The second dose should be obtained 4-8 weeks after the first dose.  Human papillomavirus (HPV) vaccine. Females aged 13-26 years who have not received the vaccine previously should obtain the 3-dose series. The vaccine is not recommended for use in pregnant females. However, pregnancy testing is not needed before receiving a dose. If a female is found to be pregnant after receiving a dose, no treatment is needed. In that case, the remaining doses should be delayed until after the pregnancy. Immunization is recommended for any person with an immunocompromised condition through the age of 29 years if she did not get any or  all doses earlier. During the 3-dose series, the second dose should be obtained 4-8 weeks after the first dose. The third dose should be obtained 24 weeks after the first dose and 16 weeks after the second dose.  Zoster vaccine. One dose is recommended for adults aged 57 years or older unless certain conditions are present.  Measles, mumps,  and rubella (MMR) vaccine. Adults born before [birth year] generally are considered immune to measles and mumps. Adults born in [birth year] or later should have 1 or more doses of MMR vaccine unless there is a contraindication to the vaccine or there is laboratory evidence of immunity to each of the three diseases. A routine second dose of MMR vaccine should be obtained at least 28 days after the first dose for students attending postsecondary schools, health care workers, or international travelers. People who received inactivated measles vaccine or an unknown type of measles vaccine during 1963-1967 should receive 2 doses of MMR vaccine. People who received inactivated mumps vaccine or an unknown type of mumps vaccine before 1979 and are at high risk for mumps infection should consider immunization with 2 doses of MMR vaccine. For females of childbearing age, rubella immunity should be determined. If there is no evidence of immunity, females who are not pregnant should be vaccinated. If there is no evidence of immunity, females who are pregnant should delay immunization until after pregnancy. Unvaccinated health care workers born before [birth year] who lack laboratory evidence of measles, mumps, or rubella immunity or laboratory confirmation of disease should consider measles and mumps immunization with 2 doses of MMR vaccine or rubella immunization with 1 dose of MMR vaccine.  Pneumococcal 13-valent conjugate (PCV13) vaccine. When indicated, a person who is uncertain of his immunization history and has no record of immunization should receive the PCV13 vaccine. All adults 50 years of age and  older should receive this vaccine. An adult aged 41 years or older who has certain medical conditions and has not been previously immunized should receive 1 dose of PCV13 vaccine. This PCV13 should be followed with a dose of pneumococcal polysaccharide (PPSV23) vaccine. Adults who are at high risk for pneumococcal disease should obtain the PPSV23 vaccine at least 8 weeks after the dose of PCV13 vaccine. Adults older than 50 years of age who have normal immune system function should obtain the PPSV23 vaccine dose at least 1 year after the dose of PCV13 vaccine.  Pneumococcal polysaccharide (PPSV23) vaccine. When PCV13 is also indicated, PCV13 should be obtained first. All adults aged 26 years and older should be immunized. An adult younger than age 15 years who has certain medical conditions should be immunized. Any person who resides in a nursing home or long-term care facility should be immunized. An adult smoker should be immunized. People with an immunocompromised condition and certain other conditions should receive both PCV13 and PPSV23 vaccines. People with human immunodeficiency virus (HIV) infection should be immunized as soon as possible after diagnosis. Immunization during chemotherapy or radiation therapy should be avoided. Routine use of PPSV23 vaccine is not recommended for American Indians, [place] Natives, or people younger than 65 years unless there are medical conditions that require PPSV23 vaccine. When indicated, people who have unknown immunization and have no record of immunization should receive PPSV23 vaccine. One-time revaccination 5 years after the first dose of PPSV23 is recommended for people aged 19-64 years who have chronic kidney failure, nephrotic syndrome, asplenia, or immunocompromised conditions. People who received 1-2 doses of PPSV23 before age 54 years should receive another dose of PPSV23 vaccine at age 71 years or later if at least 5 years have passed since the previous dose.  Doses of PPSV23 are not needed for people immunized with PPSV23 at or after age 25 years.  Meningococcal vaccine. Adults with asplenia or persistent complement component deficiencies should receive 2 doses of quadrivalent meningococcal conjugate (MenACWY-D)  vaccine. The doses should be obtained at least 2 months apart. Microbiologists working with certain meningococcal bacteria, [employer], people at risk during an outbreak, and people who travel to or live in countries with a high rate of meningitis should be immunized. A first-year college student up through age 10 years who is living in a residence hall should receive a dose if she did not receive a dose on or after her 16th birthday. Adults who have certain high-risk conditions should receive one or more doses of vaccine.  Hepatitis A vaccine. Adults who wish to be protected from this disease, have certain high-risk conditions, work with hepatitis A-infected animals, work in hepatitis A research labs, or travel to or work in countries with a high rate of hepatitis A should be immunized. Adults who were previously unvaccinated and who anticipate close contact with an international adoptee during the first 60 days after arrival in the [place] from a country with a high rate of hepatitis A should be immunized.  Hepatitis B vaccine. Adults who wish to be protected from this disease, have certain high-risk conditions, may be exposed to blood or other infectious body fluids, are household contacts or sex partners of hepatitis B positive people, are clients or workers in certain care facilities, or travel to or work in countries with a high rate of hepatitis B should be immunized.  Haemophilus influenzae type b (Hib) vaccine. A previously unvaccinated person with asplenia or sickle cell disease or having a scheduled splenectomy should receive 1 dose of Hib vaccine. Regardless of previous immunization, a recipient of a hematopoietic stem cell  transplant should receive a 3-dose series 6-12 months after her successful transplant. Hib vaccine is not recommended for adults with HIV infection. Preventive Services / Frequency Ages 78 to 65 years  Blood pressure check.** / Every 3-5 years.  Lipid and cholesterol check.** / Every 5 years beginning at age 35.  Clinical breast exam.** / Every 3 years for women in their 36s and 43s.  BRCA-related cancer risk assessment.** / For women who have family members with a BRCA-related cancer (breast, ovarian, tubal, or peritoneal cancers).  Pap test.** / Every 2 years from ages 75 through 81. Every 3 years starting at age 35 through age 30 or 39 with a history of 3 consecutive normal Pap tests.  HPV screening.** / Every 3 years from ages 10 through ages 21 to 77 with a history of 3 consecutive normal Pap tests.  Hepatitis C blood test.** / For any individual with known risks for hepatitis C.  Skin self-exam. / Monthly.  Influenza vaccine. / Every year.  Tetanus, diphtheria, and acellular pertussis (Tdap, Td) vaccine.** / Consult your health care provider. Pregnant women should receive 1 dose of Tdap vaccine during each pregnancy. 1 dose of Td every 10 years.  Varicella vaccine.** / Consult your health care provider. Pregnant females who do not have evidence of immunity should receive the first dose after pregnancy.  HPV vaccine. / 3 doses over 6 months, if 70 and younger. The vaccine is not recommended for use in pregnant females. However, pregnancy testing is not needed before receiving a dose.  Measles, mumps, rubella (MMR) vaccine.** / You need at least 1 dose of MMR if you were born in 1957 or later. You may also need a 2nd dose. For females of childbearing age, rubella immunity should be determined. If there is no evidence of immunity, females who are not pregnant should be vaccinated. If there is no  evidence of immunity, females who are pregnant should delay immunization until after  pregnancy.  Pneumococcal 13-valent conjugate (PCV13) vaccine.** / Consult your health care provider.  Pneumococcal polysaccharide (PPSV23) vaccine.** / 1 to 2 doses if you smoke cigarettes or if you have certain conditions.  Meningococcal vaccine.** / 1 dose if you are age 14 to 16 years and a Market researcher living in a residence hall, or have one of several medical conditions, you need to get vaccinated against meningococcal disease. You may also need additional booster doses.  Hepatitis A vaccine.** / Consult your health care provider.  Hepatitis B vaccine.** / Consult your health care provider.  Haemophilus influenzae type b (Hib) vaccine.** / Consult your health care provider. Ages 9 to 66 years  Blood pressure check.** / Every year.  Lipid and cholesterol check.** / Every 5 years beginning at age 34 years.  Lung cancer screening. / Every year if you are aged 26-80 years and have a 30-pack-year history of smoking and currently smoke or have quit within the past 15 years. Yearly screening is stopped once you have quit smoking for at least 15 years or develop a health problem that would prevent you from having lung cancer treatment.  Clinical breast exam.** / Every year after age 33 years.  BRCA-related cancer risk assessment.** / For women who have family members with a BRCA-related cancer (breast, ovarian, tubal, or peritoneal cancers).  Mammogram.** / Every year beginning at age 36 years and continuing for as long as you are in good health. Consult with your health care provider.  Pap test.** / Every 3 years starting at age 23 years through age 61 or 92 years with a history of 3 consecutive normal Pap tests.  HPV screening.** / Every 3 years from ages 75 years through ages 76 to 49 years with a history of 3 consecutive normal Pap tests.  Fecal occult blood test (FOBT) of stool. / Every year beginning at age 67 years and continuing until age 75 years. You may not need  to do this test if you get a colonoscopy every 10 years.  Flexible sigmoidoscopy or colonoscopy.** / Every 5 years for a flexible sigmoidoscopy or every 10 years for a colonoscopy beginning at age 34 years and continuing until age 62 years.  Hepatitis C blood test.** / For all people born from [birth year] through 1965 and any individual with known risks for hepatitis C.  Skin self-exam. / Monthly.  Influenza vaccine. / Every year.  Tetanus, diphtheria, and acellular pertussis (Tdap/Td) vaccine.** / Consult your health care provider. Pregnant women should receive 1 dose of Tdap vaccine during each pregnancy. 1 dose of Td every 10 years.  Varicella vaccine.** / Consult your health care provider. Pregnant females who do not have evidence of immunity should receive the first dose after pregnancy.  Zoster vaccine.** / 1 dose for adults aged 44 years or older.  Measles, mumps, rubella (MMR) vaccine.** / You need at least 1 dose of MMR if you were born in 1957 or later. You may also need a second dose. For females of childbearing age, rubella immunity should be determined. If there is no evidence of immunity, females who are not pregnant should be vaccinated. If there is no evidence of immunity, females who are pregnant should delay immunization until after pregnancy.  Pneumococcal 13-valent conjugate (PCV13) vaccine.** / Consult your health care provider.  Pneumococcal polysaccharide (PPSV23) vaccine.** / 1 to 2 doses if you smoke cigarettes or if you have  certain conditions.  Meningococcal vaccine.** / Consult your health care provider.  Hepatitis A vaccine.** / Consult your health care provider.  Hepatitis B vaccine.** / Consult your health care provider.  Haemophilus influenzae type b (Hib) vaccine.** / Consult your health care provider. Ages 11 years and over  Blood pressure check.** / Every year.  Lipid and cholesterol check.** / Every 5 years beginning at age 53 years.  Lung cancer  screening. / Every year if you are aged 107-80 years and have a 30-pack-year history of smoking and currently smoke or have quit within the past 15 years. Yearly screening is stopped once you have quit smoking for at least 15 years or develop a health problem that would prevent you from having lung cancer treatment.  Clinical breast exam.** / Every year after age 66 years.  BRCA-related cancer risk assessment.** / For women who have family members with a BRCA-related cancer (breast, ovarian, tubal, or peritoneal cancers).  Mammogram.** / Every year beginning at age 17 years and continuing for as long as you are in good health. Consult with your health care provider.  Pap test.** / Every 3 years starting at age 34 years through age 32 or 77 years with 3 consecutive normal Pap tests. Testing can be stopped between 65 and 70 years with 3 consecutive normal Pap tests and no abnormal Pap or HPV tests in the past 10 years.  HPV screening.** / Every 3 years from ages 65 years through ages 58 or 28 years with a history of 3 consecutive normal Pap tests. Testing can be stopped between 65 and 70 years with 3 consecutive normal Pap tests and no abnormal Pap or HPV tests in the past 10 years.  Fecal occult blood test (FOBT) of stool. / Every year beginning at age 61 years and continuing until age 62 years. You may not need to do this test if you get a colonoscopy every 10 years.  Flexible sigmoidoscopy or colonoscopy.** / Every 5 years for a flexible sigmoidoscopy or every 10 years for a colonoscopy beginning at age 68 years and continuing until age 12 years.  Hepatitis C blood test.** / For all people born from [birth year] through 1965 and any individual with known risks for hepatitis C.  Osteoporosis screening.** / A one-time screening for women ages 28 years and over and women at risk for fractures or osteoporosis.  Skin self-exam. / Monthly.  Influenza vaccine. / Every year.  Tetanus, diphtheria, and  acellular pertussis (Tdap/Td) vaccine.** / 1 dose of Td every 10 years.  Varicella vaccine.** / Consult your health care provider.  Zoster vaccine.** / 1 dose for adults aged 34 years or older.  Pneumococcal 13-valent conjugate (PCV13) vaccine.** / Consult your health care provider.  Pneumococcal polysaccharide (PPSV23) vaccine.** / 1 dose for all adults aged 12 years and older.  Meningococcal vaccine.** / Consult your health care provider.  Hepatitis A vaccine.** / Consult your health care provider.  Hepatitis B vaccine.** / Consult your health care provider.  Haemophilus influenzae type b (Hib) vaccine.** / Consult your health care provider. ** Family history and personal history of risk and conditions may change your health care provider's recommendations.   This information is not intended to replace advice given to you by your health care provider. Make sure you discuss any questions you have with your health care provider.   Document Released: 02/28/2001 Document Revised: 01/23/2014 Document Reviewed: 05/30/2010 Elsevier Interactive Patient Education [employer].

## 2015-08-01 NOTE — Assessment & Plan Note (Signed)
Follows with psychiatry and doing well

## 2015-08-01 NOTE — Assessment & Plan Note (Addendum)
Patient encouraged to maintain heart healthy diet, regular exercise, adequate sleep. Consider daily probiotics. Take medications as prescribed. Given and reviewed copy of ACP documents from [employer] and encouraged to complete and return. Labs and MGM ordered today

## 2015-08-01 NOTE — Progress Notes (Signed)
Patient ID: Cheyenne Garner, female   DOB: Nov 17, 1965, 50 y.o.   MRN: [id number]   Subjective:    Patient ID: Cheyenne Garner, female    DOB: 03/25/1965, 50 y.o.   MRN: [id number]  Chief Complaint  Patient presents with  . Establish Care    HPI Patient is in today for annual exam and to establish care. No recent illness or acute concerns. Has previously been seen by [facility]. She also follows with Dr Letta Moynahan of psychiatry. She also follows with Orlene Och. Denies CP/palp/SOB/HA/congestion/fevers/GI or GU c/o. Taking meds as prescribed  Past Medical History  Diagnosis Date  . Bipolar 1 disorder ([place])   . Thyroid disease   . Preventative health care 07/23/2015  . Asthma     cough variant asthma developed in last couple of years    Past Surgical History  Procedure Laterality Date  . Appendectomy    . Skin biopsy      back moles removed (precancer?)    Family History  Problem Relation Age of Onset  . Arthritis Mother   . Hypertension Father   . Heart disease Father     MI, s/p triple bypass at age 62  . Neuropathy Father     toxic peripheral   . Cancer Paternal Grandfather     young  . Cholelithiasis Sister   . Mental retardation Sister     ADD, schizoaffective d/o  . Obesity Sister   . Arthritis Brother   . Cancer Cousin 40    ovarian    Social History   Social History  . Marital Status: Married    Spouse Name: N/A  . Number of Children: N/A  . Years of Education: N/A   Occupational History  . Not on file.   Social History Main Topics  . Smoking status: Never Smoker   . Smokeless tobacco: Not on file  . Alcohol Use: No  . Drug Use: No  . Sexual Activity: No   Other Topics Concern  . 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 No major dietary restrictions    Outpatient Prescriptions Prior to Visit  Medication Sig Dispense Refill  . lithium 300 MG tablet Take  300 mg by mouth 2 (two) times daily.    . beclomethasone (QVAR) 80 MCG/ACT inhaler Inhale 2 puffs into the lungs 2 (two) times daily.    . budesonide-formoterol (SYMBICORT) 160-4.5 MCG/ACT inhaler Inhale 2 puffs into the lungs 2 (two) times daily.    Marland Kitchen HYDROcodone-acetaminophen (NORCO/VICODIN) 5-325 MG per tablet Take 1 tablet by mouth every 4 (four) hours as needed. 15 tablet 0  . levothyroxine (SYNTHROID, LEVOTHROID) 125 MCG tablet Take 125 mcg by mouth daily before breakfast.    . ondansetron (ZOFRAN ODT) 4 MG disintegrating tablet Take 1 tablet (4 mg total) by mouth every 8 (eight) hours as needed for nausea or vomiting. 20 tablet 0  . pantoprazole (PROTONIX) 40 MG tablet Take 1 tablet (40 mg total) by mouth daily. 30 tablet 1   No facility-administered medications prior to visit.    Allergies  Allergen Reactions  . Gentamycin [Gentamicin]     Reacted to eye ointment, swelling red painful eyes    Review of Systems  Constitutional: Negative for fever, chills and malaise/fatigue.  HENT: Positive for congestion. Negative for hearing loss.   Eyes: Negative for discharge.  Respiratory: Negative for cough, sputum  production and shortness of breath.   Cardiovascular: Negative for chest pain, palpitations and leg swelling.  Gastrointestinal: Negative for heartburn, nausea, vomiting, abdominal pain, diarrhea, constipation and blood in stool.  Genitourinary: Negative for dysuria, urgency, frequency and hematuria.  Musculoskeletal: Negative for myalgias, back pain and falls.  Skin: Negative for rash.  Neurological: Negative for dizziness, sensory change, loss of consciousness, weakness and headaches.  Endo/Heme/Allergies: Positive for environmental allergies. Does not bruise/bleed easily.  Psychiatric/Behavioral: Negative for depression and suicidal ideas. The patient is not nervous/anxious and does not have insomnia.        Objective:    Physical Exam  Constitutional: She is oriented to  person, place, and time. She appears well-developed and well-nourished. No distress.  HENT:  Head: Normocephalic and atraumatic.  Eyes: Conjunctivae are normal.  Neck: Neck supple. No thyromegaly present.  Cardiovascular: Normal rate, regular rhythm and normal heart sounds.   No murmur heard. Pulmonary/Chest: Effort normal and breath sounds normal. No respiratory distress.  Abdominal: Soft. Bowel sounds are normal. She exhibits no distension and no mass. There is no tenderness.  Musculoskeletal: She exhibits no edema.  Lymphadenopathy:    She has no cervical adenopathy.  Neurological: She is alert and oriented to person, place, and time.  Skin: Skin is warm and dry.  Psychiatric: She has a normal mood and affect. Her behavior is normal.    BP 120/78 mmHg  Pulse 79  Temp(Src) 98.1 F (36.7 C) (Oral)  Ht 5' 6.5" (1.689 m)  Wt 151 lb (68.493 kg)  BMI 24.01 kg/m2  SpO2 98% Wt Readings from Last 3 Encounters:  07/23/15 151 lb (68.493 kg)  06/25/14 140 lb (63.504 kg)     Lab Results  Component Value Date   WBC 5.0 07/23/2015   HGB 14.0 07/23/2015   HCT 41.7 07/23/2015   PLT 163.0 07/23/2015   GLUCOSE 85 07/23/2015   CHOL 176 07/23/2015   TRIG 65.0 07/23/2015   HDL 68.40 07/23/2015   LDLCALC 95 07/23/2015   ALT 14 07/23/2015   AST 16 07/23/2015   NA 139 07/23/2015   K 4.3 07/23/2015   CL 104 07/23/2015   CREATININE 0.89 07/23/2015   BUN 18 07/23/2015   CO2 33* 07/23/2015   TSH 3.80 07/23/2015    Lab Results  Component Value Date   TSH 3.80 07/23/2015   Lab Results  Component Value Date   WBC 5.0 07/23/2015   HGB 14.0 07/23/2015   HCT 41.7 07/23/2015   MCV 88.4 07/23/2015   PLT 163.0 07/23/2015   Lab Results  Component Value Date   NA 139 07/23/2015   K 4.3 07/23/2015   CO2 33* 07/23/2015   GLUCOSE 85 07/23/2015   BUN 18 07/23/2015   CREATININE 0.89 07/23/2015   BILITOT 0.7 07/23/2015   ALKPHOS 65 07/23/2015   AST 16 07/23/2015   ALT 14 07/23/2015     PROT 6.7 07/23/2015   ALBUMIN 4.2 07/23/2015   CALCIUM 9.7 07/23/2015   ANIONGAP 5 06/25/2014   GFR 71.24 07/23/2015   Lab Results  Component Value Date   CHOL 176 07/23/2015   Lab Results  Component Value Date   HDL 68.40 07/23/2015   Lab Results  Component Value Date   LDLCALC 95 07/23/2015   Lab Results  Component Value Date   TRIG 65.0 07/23/2015   Lab Results  Component Value Date   CHOLHDL 3 07/23/2015   No results found for: HGBA1C     Assessment & Plan:  Problem List Items Addressed This Visit    Asthma    Cough variant asthma a couple of years ago. Has recurred occasionally. Started a noninflammatory diet helped some but not enough, has done with Qvar regularly and then add Symbicort. Flares with flu shot, symbicort 2 weeks prior and after shot helps, encouraged to start Probiotic daily. Uses Albuterol infrequently      Thyroid disease    Follows with [facility], Dr Julien Nordmann, they manage her Nature-Throid      Relevant Medications   Thyroid (NATURE-THROID) 113.75 MG TABS   Bipolar 1 disorder ([place])    Follows with psychiatry and doing well      Preventative health care - Primary    Patient encouraged to maintain heart healthy diet, regular exercise, adequate sleep. Consider daily probiotics. Take medications as prescribed. Given and reviewed copy of ACP documents from [employer] and encouraged to complete and return. Labs and MGM ordered today      Relevant Orders   CBC (Completed)   TSH (Completed)   Comprehensive metabolic panel (Completed)   Lipid panel (Completed)    Other Visit Diagnoses    Breast cancer screening        Relevant Orders    MM Digital Screening       I have discontinued Ms. Murfin's levothyroxine, budesonide-formoterol, beclomethasone, HYDROcodone-acetaminophen, ondansetron, and pantoprazole. I am also having her maintain her lithium, Thyroid, and BIEST/PROGESTERONE.  Meds ordered this  encounter  Medications  . Thyroid (NATURE-THROID) 113.75 MG TABS    Sig: Take 1 tablet by mouth daily.  . Estradiol-Estriol-Progesterone (BIEST/PROGESTERONE) CREA    Sig: Place onto the skin 2 (two) times daily.      Penni Homans, MD

## 2015-09-22 DIAGNOSIS — F3173 Bipolar disorder, in partial remission, most recent episode manic: Secondary | ICD-10-CM | POA: Diagnosis not present

## 2015-10-19 ENCOUNTER — Ambulatory Visit (HOSPITAL_BASED_OUTPATIENT_CLINIC_OR_DEPARTMENT_OTHER)
Admission: RE | Admit: 2015-10-19 | Discharge: 2015-10-19 | Disposition: A | Discharge status: Home / Self Care | Payer: BLUE CROSS/BLUE SHIELD | Source: Ambulatory Visit | Attending: Family Medicine | Admitting: Family Medicine

## 2015-10-19 DIAGNOSIS — Z1231 Encounter for screening mammogram for malignant neoplasm of breast: Secondary | ICD-10-CM | POA: Insufficient documentation

## 2015-10-19 DIAGNOSIS — Z1239 Encounter for other screening for malignant neoplasm of breast: Secondary | ICD-10-CM

## 2015-12-01 ENCOUNTER — Ambulatory Visit (INDEPENDENT_AMBULATORY_CARE_PROVIDER_SITE_OTHER): Payer: BLUE CROSS/BLUE SHIELD | Admitting: Medical

## 2015-12-01 ENCOUNTER — Encounter: Payer: Self-pay | Admitting: Medical

## 2015-12-01 VITALS — BP 116/76 | HR 81 | Temp 98.0°F | Ht 66.5 in | Wt 153.0 lb

## 2015-12-01 DIAGNOSIS — J309 Allergic rhinitis, unspecified: Secondary | ICD-10-CM

## 2015-12-01 DIAGNOSIS — J3489 Other specified disorders of nose and nasal sinuses: Secondary | ICD-10-CM

## 2015-12-01 DIAGNOSIS — H6690 Otitis media, unspecified, unspecified ear: Secondary | ICD-10-CM

## 2015-12-01 MED ORDER — TOBRAMYCIN 0.3 % OP SOLN
2.0000 [drp] | Freq: Four times a day (QID) | OPHTHALMIC | 0 refills | Status: DC
Start: 2015-12-01 — End: 2016-09-12

## 2015-12-01 MED ORDER — AZITHROMYCIN 250 MG PO TABS: ORAL_TABLET | ORAL | 0 refills | Status: DC

## 2015-12-01 MED ORDER — AZELASTINE HCL 0.1 % NA SOLN: 2.0000 | Freq: Two times a day (BID) | NASAL | 3 refills | Status: DC

## 2015-12-01 MED ORDER — BENZONATATE 100 MG PO CAPS
100.0000 mg | ORAL_CAPSULE | Freq: Three times a day (TID) | ORAL | 0 refills | Status: DC | PRN
Start: 2015-12-01 — End: 2016-09-12

## 2015-12-01 NOTE — Patient Instructions (Signed)
For ear infection on both ears(likely early) will rx azithromycin.  For nasal congestion and allergies will rx astelin.  For conjunctivitis will rx tobrex.  For cough rx benzonatate.  Follow up in 7 days or as needed

## 2015-12-01 NOTE — Progress Notes (Signed)
Subjective:    Patient ID: Cheyenne Garner, female    DOB: [birth year], 50 y.o.   MRN: [id number]  HPI  Pt in for on and off eye irritation for 2 weeks. She was using otc eye drops which would help.  Last 24 hours had matting to her left eye. Lids stuck today this am.   Pt last night got severe nasal congestion, sinus pressure and some cough. Cough is dry.  Pt states runny nose for one week and states hx of allergies.   No fever, no chills and no sweats.  Pt states in past gentamycin ointment bothered her eye. Then later took gentamyinc drops and no reaction. Prior use of tobex as well and no reaction. Pt 100% sure.   Review of Systems  HENT: Positive for congestion and rhinorrhea. Negative for ear pain, sinus pain, sinus pressure and sneezing.   Eyes: Positive for discharge and redness. Negative for photophobia and visual disturbance.       Matting of eye.  Respiratory: Positive for cough. Negative for chest tightness, shortness of breath and wheezing.   Cardiovascular: Negative for chest pain and palpitations.  Gastrointestinal: Negative for abdominal pain.  Musculoskeletal: Negative for back pain and gait problem.  Neurological: Negative for dizziness, tremors, speech difficulty and headaches.  Hematological: Negative for adenopathy. Does not bruise/bleed easily.  Psychiatric/Behavioral: Negative for behavioral problems and confusion.   Past Medical History:  Diagnosis Date  . Asthma    cough variant asthma developed in last couple of years  . Bipolar 1 disorder ([place])   . Preventative health care 07/23/2015  . Thyroid disease      Social History   Social History  . Marital status: Married    Spouse name: N/A  . Number of children: N/A  . Years of education: N/A   Occupational History  . Not on file.   Social History Main Topics  . Smoking status: Never Smoker  . Smokeless tobacco: Not on file  . Alcohol use No  . Drug use: No  . Sexual activity: No   Other  Topics Concern  . 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 No major dietary restrictions    Past Surgical History:  Procedure Laterality Date  . APPENDECTOMY    . SKIN BIOPSY     back moles removed (precancer?)    Family History  Problem Relation Age of Onset  . Arthritis Mother   . Hypertension Father   . Heart disease Father     MI, s/p triple bypass at age 34  . Neuropathy Father     toxic peripheral   . Cancer Paternal Grandfather     young  . Cholelithiasis Sister   . Mental retardation Sister     ADD, schizoaffective d/o  . Obesity Sister   . Arthritis Brother   . Cancer Cousin 40    ovarian    Allergies  Allergen Reactions  . Gentamycin [Gentamicin]     Reacted to eye ointment, swelling red painful eyes    Current Outpatient Prescriptions on File Prior to Visit  Medication Sig Dispense Refill  . Estradiol-Estriol-Progesterone (BIEST/PROGESTERONE) CREA Place onto the skin 2 (two) times daily.     Marland Kitchen lithium 300 MG tablet Take 300 mg by mouth 2 (two) times daily.    . Thyroid (NATURE-THROID) 113.75 MG TABS Take 1 tablet by mouth daily.  No current facility-administered medications on file prior to visit.     BP 116/76 (BP Location: Left Arm, Patient Position: Sitting)   Pulse 81   Temp 98 F (36.7 C) (Oral)   Ht 5' 6.5" (1.689 m)   Wt 153 lb (69.4 kg)   SpO2 99%   BMI 24.32 kg/m       Objective:   Physical Exam  General  Mental Status - Alert. General Appearance - Well groomed. Not in acute distress.  Skin Rashes- No Rashes.  HEENT Head- Normal. Ear Auditory Canal - Left- Normal. Right - Normal.Tympanic Membrane- Left- central moderate redness. Right- central moderate redness. Eye Sclera/Conjunctiva- Left- Normal. Right- Normal. Nose & Sinuses Nasal Mucosa- Left-  Boggy and Congested. Right-  Boggy and  Congested.Bilateral   maxillary and   frontal sinus  pressure. Mouth & Throat Lips: Upper Lip- Normal: no dryness, cracking, pallor, cyanosis, or vesicular eruption. Lower Lip-Normal: no dryness, cracking, pallor, cyanosis or vesicular eruption. Buccal Mucosa- Bilateral- No Aphthous ulcers. Oropharynx- No Discharge or Erythema. Tonsils: Characteristics- Bilateral- mild/faint Erythema or Congestion. Size/Enlargement- Bilateral- No enlargement. Discharge- bilateral-None.  Neck Neck- Supple. No Masses.   Chest and Lung Exam Auscultation: Breath Sounds:-Clear even and unlabored.  Cardiovascular Auscultation:Rythm- Regular, rate and rhythm. Murmurs & Other Heart Sounds:Ausculatation of the heart reveal- No Murmurs.  Lymphatic Head & Neck General Head & Neck Lymphatics: Bilateral: Description- No Localized lymphadenopathy.  Lt eye- injected conjunctiva. No matting presently.     Assessment & Plan:  For ear infection on both ears(likely early) will rx azithromycin.  For nasal congestion and allergies will rx astelin.  For conjunctivitis will rx tobrex.  For cough rx benzonatate.  Follow up in 7 days or as needed

## 2015-12-01 NOTE — Progress Notes (Signed)
Pre visit review using our clinic review tool, if applicable. No additional management support is needed unless otherwise documented below in the visit note. 

## 2015-12-13 ENCOUNTER — Telehealth: Payer: Self-pay | Admitting: Family Medicine

## 2015-12-13 NOTE — Telephone Encounter (Signed)
error:[id number] ° °

## 2015-12-14 DIAGNOSIS — B308 Other viral conjunctivitis: Secondary | ICD-10-CM | POA: Diagnosis not present

## 2015-12-21 DIAGNOSIS — F3173 Bipolar disorder, in partial remission, most recent episode manic: Secondary | ICD-10-CM | POA: Diagnosis not present

## 2016-03-21 DIAGNOSIS — F3173 Bipolar disorder, in partial remission, most recent episode manic: Secondary | ICD-10-CM | POA: Diagnosis not present

## 2016-04-06 DIAGNOSIS — R5383 Other fatigue: Secondary | ICD-10-CM | POA: Diagnosis not present

## 2016-04-06 DIAGNOSIS — E039 Hypothyroidism, unspecified: Secondary | ICD-10-CM | POA: Diagnosis not present

## 2016-04-06 DIAGNOSIS — E559 Vitamin D deficiency, unspecified: Secondary | ICD-10-CM | POA: Diagnosis not present

## 2016-04-06 DIAGNOSIS — N945 Secondary dysmenorrhea: Secondary | ICD-10-CM | POA: Diagnosis not present

## 2016-05-01 DIAGNOSIS — R5383 Other fatigue: Secondary | ICD-10-CM | POA: Diagnosis not present

## 2016-05-01 DIAGNOSIS — N943 Premenstrual tension syndrome: Secondary | ICD-10-CM | POA: Diagnosis not present

## 2016-05-01 DIAGNOSIS — J45909 Unspecified asthma, uncomplicated: Secondary | ICD-10-CM | POA: Diagnosis not present

## 2016-05-01 DIAGNOSIS — E063 Autoimmune thyroiditis: Secondary | ICD-10-CM | POA: Diagnosis not present

## 2016-05-08 DIAGNOSIS — Z79899 Other long term (current) drug therapy: Secondary | ICD-10-CM | POA: Diagnosis not present

## 2016-05-15 DIAGNOSIS — F3173 Bipolar disorder, in partial remission, most recent episode manic: Secondary | ICD-10-CM | POA: Diagnosis not present

## 2016-08-16 DIAGNOSIS — F3173 Bipolar disorder, in partial remission, most recent episode manic: Secondary | ICD-10-CM | POA: Diagnosis not present

## 2016-09-12 ENCOUNTER — Encounter: Payer: Self-pay | Admitting: Family Medicine

## 2016-09-12 ENCOUNTER — Other Ambulatory Visit (HOSPITAL_COMMUNITY)
Admission: RE | Admit: 2016-09-12 | Discharge: 2016-09-12 | Disposition: A | Discharge status: Home / Self Care | Payer: BLUE CROSS/BLUE SHIELD | Source: Ambulatory Visit | Attending: Family Medicine | Admitting: Family Medicine

## 2016-09-12 ENCOUNTER — Ambulatory Visit (INDEPENDENT_AMBULATORY_CARE_PROVIDER_SITE_OTHER): Payer: BLUE CROSS/BLUE SHIELD | Admitting: Family Medicine

## 2016-09-12 DIAGNOSIS — Z860100 Personal history of colon polyps, unspecified: Secondary | ICD-10-CM | POA: Insufficient documentation

## 2016-09-12 DIAGNOSIS — Z0000 Encounter for general adult medical examination without abnormal findings: Secondary | ICD-10-CM

## 2016-09-12 DIAGNOSIS — K635 Polyp of colon: Secondary | ICD-10-CM | POA: Insufficient documentation

## 2016-09-12 DIAGNOSIS — Z124 Encounter for screening for malignant neoplasm of cervix: Secondary | ICD-10-CM | POA: Insufficient documentation

## 2016-09-12 HISTORY — DX: Polyp of colon: K63.5

## 2016-09-12 HISTORY — DX: Personal history of colonic polyps: Z86.010

## 2016-09-12 HISTORY — DX: Personal history of colon polyps, unspecified: Z86.0100

## 2016-09-12 HISTORY — DX: Encounter for screening for malignant neoplasm of cervix: Z12.4

## 2016-09-12 NOTE — Assessment & Plan Note (Signed)
Pap today, no concerns on exam.  

## 2016-09-12 NOTE — Assessment & Plan Note (Addendum)
Patient encouraged to maintain heart healthy diet, regular exercise, adequate sleep. Consider daily probiotics. Take medications as prescribed. Labs from 2017 reviewed. Pap performed today. MGM 10/17 wnl. Colonoscopy 2017

## 2016-09-12 NOTE — Patient Instructions (Signed)
Preventive Care 40-64 Years, Female Preventive care refers to lifestyle choices and visits with your health care provider that can promote health and wellness. What does preventive care include?  A yearly physical exam. This is also called an annual well check.  Dental exams once or twice a year.  Routine eye exams. Ask your health care provider how often you should have your eyes checked.  Personal lifestyle choices, including: ? Daily care of your teeth and gums. ? Regular physical activity. ? Eating a healthy diet. ? Avoiding tobacco and drug use. ? Limiting alcohol use. ? Practicing safe sex. ? Taking low-dose aspirin daily starting at age 58. ? Taking vitamin and mineral supplements as recommended by your health care provider. What happens during an annual well check? The services and screenings done by your health care provider during your annual well check will depend on your age, overall health, lifestyle risk factors, and family history of disease. Counseling Your health care provider may ask you questions about your:  Alcohol use.  Tobacco use.  Drug use.  Emotional well-being.  Home and relationship well-being.  Sexual activity.  Eating habits.  Work and work Statistician.  Method of birth control.  Menstrual cycle.  Pregnancy history.  Screening You may have the following tests or measurements:  Height, weight, and BMI.  Blood pressure.  Lipid and cholesterol levels. These may be checked every 5 years, or more frequently if you are over 81 years old.  Skin check.  Lung cancer screening. You may have this screening every year starting at age 78 if you have a 30-pack-year history of smoking and currently smoke or have quit within the past 15 years.  Fecal occult blood test (FOBT) of the stool. You may have this test every year starting at age 65.  Flexible sigmoidoscopy or colonoscopy. You may have a sigmoidoscopy every 5 years or a colonoscopy  every 10 years starting at age 30.  Hepatitis C blood test.  Hepatitis B blood test.  Sexually transmitted disease (STD) testing.  Diabetes screening. This is done by checking your blood sugar (glucose) after you have not eaten for a while (fasting). You may have this done every 1-3 years.  Mammogram. This may be done every 1-2 years. Talk to your health care provider about when you should start having regular mammograms. This may depend on whether you have a family history of breast cancer.  BRCA-related cancer screening. This may be done if you have a family history of breast, ovarian, tubal, or peritoneal cancers.  Pelvic exam and Pap test. This may be done every 3 years starting at age 80. Starting at age 36, this may be done every 5 years if you have a Pap test in combination with an HPV test.  Bone density scan. This is done to screen for osteoporosis. You may have this scan if you are at high risk for osteoporosis.  Discuss your test results, treatment options, and if necessary, the need for more tests with your health care provider. Vaccines Your health care provider may recommend certain vaccines, such as:  Influenza vaccine. This is recommended every year.  Tetanus, diphtheria, and acellular pertussis (Tdap, Td) vaccine. You may need a Td booster every 10 years.  Varicella vaccine. You may need this if you have not been vaccinated.  Zoster vaccine. You may need this after age 5.  Measles, mumps, and rubella (MMR) vaccine. You may need at least one dose of MMR if you were born in  1957 or later. You may also need a second dose.  Pneumococcal 13-valent conjugate (PCV13) vaccine. You may need this if you have certain conditions and were not previously vaccinated.  Pneumococcal polysaccharide (PPSV23) vaccine. You may need one or two doses if you smoke cigarettes or if you have certain conditions.  Meningococcal vaccine. You may need this if you have certain  conditions.  Hepatitis A vaccine. You may need this if you have certain conditions or if you travel or work in places where you may be exposed to hepatitis A.  Hepatitis B vaccine. You may need this if you have certain conditions or if you travel or work in places where you may be exposed to hepatitis B.  Haemophilus influenzae type b (Hib) vaccine. You may need this if you have certain conditions.  Talk to your health care provider about which screenings and vaccines you need and how often you need them. This information is not intended to replace advice given to you by your health care provider. Make sure you discuss any questions you have with your health care provider. Document Released: 01/29/2015 Document Revised: 09/22/2015 Document Reviewed: 11/03/2014 Elsevier Interactive Patient Education  2017 [employer].

## 2016-09-12 NOTE — Progress Notes (Signed)
Subjective:  I acted as a Education administrator for Dr. Charlett Blake. [place], [place]  Patient ID: Cheyenne Garner, female    DOB: [birth year], 51 y.o.   MRN: [id number]  No chief complaint on file.   HPI  Patient is in today for an annual exam. She is in need of of Pap today but denies any GYN or breast concerns. No recent febrile illness or recent hospitalizations. MGM in past year normal. Colonoscopy last year with Dr [name]. Take Naturethroid 2 gram daily. Methylation Complete daily which is a bioactive methyl B12, folate, psp 1 bid. Also Biest 50/50 (estriol/estrdiol) progesterone HRT 3.0/160 m: 3 clicks topically 2 x daily. She is feeling well today. Denies CP/palp/SOB/HA/congestion/fevers/GI or GU c/o. Taking meds as prescribed  Patient Care Team: Mosie Lukes, MD as PCP - General (Family Medicine)   Past Medical History:  Diagnosis Date  . Asthma    cough variant asthma developed in last couple of years  . Bipolar 1 disorder ([place])   . Cervical cancer screening 09/12/2016  . Colon polyp 09/12/2016  . Hx of colonic polyp 09/12/2016   Colonoscopy 2017 with 1 small polyp, done by Dr [name], repeat colonoscopy in 5 years, 2022  . Preventative health care 07/23/2015  . Thyroid disease     Past Surgical History:  Procedure Laterality Date  . APPENDECTOMY    . SKIN BIOPSY     back moles removed (precancer?)    Family History  Problem Relation Age of Onset  . Arthritis Mother   . Hypertension Father   . Heart disease Father        MI, s/p triple bypass at age 50  . Neuropathy Father        toxic peripheral   . Cancer Paternal Grandfather        young  . Cholelithiasis Sister   . Mental retardation Sister        ADD, schizoaffective d/o  . Obesity Sister   . Arthritis Brother   . Cancer Cousin 40       ovarian    Social History   Social History  . Marital status: Married    Spouse name: N/A  . Number of children: N/A  . Years of education: N/A   Occupational History  . Not on file.     Social History Main Topics  . Smoking status: Never Smoker  . Smokeless tobacco: Never Used  . Alcohol use No  . Drug use: No  . Sexual activity: No   Other Topics Concern  . 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 No major dietary restrictions    Outpatient Medications Prior to Visit  Medication Sig Dispense Refill  . Estradiol-Estriol-Progesterone (BIEST/PROGESTERONE) CREA Place onto the skin 2 (two) times daily.     Marland Kitchen lithium 300 MG tablet Take 300 mg by mouth 2 (two) times daily.    Marland Kitchen azelastine (ASTELIN) 0.1 % nasal spray Place 2 sprays into both nostrils 2 (two) times daily. Use in each nostril as directed 30 mL 3  . azithromycin (ZITHROMAX) 250 MG tablet Take 2 tablets by mouth on day 1, followed by 1 tablet by mouth daily for 4 days. 6 tablet 0  . benzonatate (TESSALON) 100 MG capsule Take 1 capsule (100 mg total) by mouth 3 (three) times daily as needed for cough. 21 capsule 0  . Thyroid (NATURE-THROID) 113.75 MG TABS Take  1 tablet by mouth daily.    Marland Kitchen tobramycin (TOBREX) 0.3 % ophthalmic solution Place 2 drops into the left eye every 6 (six) hours. 5 mL 0   No facility-administered medications prior to visit.     Allergies  Allergen Reactions  . Gentamycin [Gentamicin]     Reacted to eye ointment, swelling red painful eyes    Review of Systems  Constitutional: Negative for fever and malaise/fatigue.  HENT: Negative for congestion.   Eyes: Negative for blurred vision.  Respiratory: Negative for shortness of breath.   Cardiovascular: Negative for chest pain, palpitations and leg swelling.  Gastrointestinal: Negative for abdominal pain, blood in stool and nausea.  Genitourinary: Negative for dysuria and frequency.  Musculoskeletal: Negative for falls.  Skin: Negative for rash.  Neurological: Negative for dizziness, loss of consciousness and headaches.  Endo/Heme/Allergies: Negative for  environmental allergies.  Psychiatric/Behavioral: Negative for depression. The patient is not nervous/anxious.        Objective:    Physical Exam  Constitutional: She is oriented to person, place, and time. She appears well-developed and well-nourished. No distress.  HENT:  Head: Normocephalic and atraumatic.  Eyes: Conjunctivae are normal.  Neck: Neck supple. No thyromegaly present.  Cardiovascular: Normal rate, regular rhythm and normal heart sounds.   No murmur heard. Pulmonary/Chest: Effort normal and breath sounds normal. No respiratory distress.  Abdominal: Soft. Bowel sounds are normal. She exhibits no distension and no mass. There is no tenderness.  Genitourinary: Vagina normal and uterus normal. No vaginal discharge found.  Musculoskeletal: She exhibits no edema.  Lymphadenopathy:    She has no cervical adenopathy.  Neurological: She is alert and oriented to person, place, and time.  Skin: Skin is warm and dry.  Psychiatric: She has a normal mood and affect. Her behavior is normal.    BP 114/70 (BP Location: Right Arm, Patient Position: Sitting, Cuff Size: Normal)   Pulse 75   Temp 97.7 F (36.5 C) (Oral)   Ht 5' 6.5" (1.689 m)   Wt 154 lb 3.2 oz (69.9 kg)   SpO2 97%   BMI 24.52 kg/m  Wt Readings from Last 3 Encounters:  09/12/16 154 lb 3.2 oz (69.9 kg)  12/01/15 153 lb (69.4 kg)  07/23/15 151 lb (68.5 kg)   BP Readings from Last 3 Encounters:  09/12/16 114/70  12/01/15 116/76  07/23/15 120/78     Immunization History  Administered Date(s) Administered  . Influenza-Unspecified 10/01/2015    Health Maintenance  Topic Date Due  . HIV Screening  02/21/1980  . TETANUS/TDAP  02/21/1984  . PAP SMEAR  12/28/2014  . INFLUENZA VACCINE  08/16/2016  . MAMMOGRAM  10/18/2017  . COLONOSCOPY  02/23/2025    Lab Results  Component Value Date   WBC 5.0 07/23/2015   HGB 14.0 07/23/2015   HCT 41.7 07/23/2015   PLT 163.0 07/23/2015   GLUCOSE 85 07/23/2015   CHOL  176 07/23/2015   TRIG 65.0 07/23/2015   HDL 68.40 07/23/2015   LDLCALC 95 07/23/2015   ALT 14 07/23/2015   AST 16 07/23/2015   NA 139 07/23/2015   K 4.3 07/23/2015   CL 104 07/23/2015   CREATININE 0.89 07/23/2015   BUN 18 07/23/2015   CO2 33 (H) 07/23/2015   TSH 3.80 07/23/2015    Lab Results  Component Value Date   TSH 3.80 07/23/2015   Lab Results  Component Value Date   WBC 5.0 07/23/2015   HGB 14.0 07/23/2015   HCT 41.7 07/23/2015  MCV 88.4 07/23/2015   PLT 163.0 07/23/2015   Lab Results  Component Value Date   NA 139 07/23/2015   K 4.3 07/23/2015   CO2 33 (H) 07/23/2015   GLUCOSE 85 07/23/2015   BUN 18 07/23/2015   CREATININE 0.89 07/23/2015   BILITOT 0.7 07/23/2015   ALKPHOS 65 07/23/2015   AST 16 07/23/2015   ALT 14 07/23/2015   PROT 6.7 07/23/2015   ALBUMIN 4.2 07/23/2015   CALCIUM 9.7 07/23/2015   ANIONGAP 5 06/25/2014   GFR 71.24 07/23/2015   Lab Results  Component Value Date   CHOL 176 07/23/2015   Lab Results  Component Value Date   HDL 68.40 07/23/2015   Lab Results  Component Value Date   LDLCALC 95 07/23/2015   Lab Results  Component Value Date   TRIG 65.0 07/23/2015   Lab Results  Component Value Date   CHOLHDL 3 07/23/2015   No results found for: HGBA1C       Assessment & Plan:   Problem List Items Addressed This Visit    Preventative health care    Patient encouraged to maintain heart healthy diet, regular exercise, adequate sleep. Consider daily probiotics. Take medications as prescribed. Labs from 2017 reviewed. Pap performed today. MGM 10/17 wnl. Colonoscopy 2017       Cervical cancer screening    Pap today, no concerns on exam.       Hx of colonic polyp      I have discontinued Ms. Pettey's Thyroid, azithromycin, azelastine, benzonatate, and tobramycin. I am also having her maintain her lithium, BIEST/PROGESTERONE, NATURE-THROID, Probiotic Product (PROBIOTIC-10 PO), and Vitamin D3.  Meds ordered this  encounter  Medications  . NATURE-THROID 130 MG tablet    Sig: Take 1 tablet by mouth daily.    Refill:  2  . Probiotic Product (PROBIOTIC-10 PO)    Sig: Take 1 capsule by mouth daily.  . Cholecalciferol (VITAMIN D3) 5000 units CAPS    Sig: Take 1 capsule by mouth daily.    CMA served as Education administrator during this visit. History, Physical and Plan performed by medical provider. Documentation and orders reviewed and attested to.  Penni Homans, MD

## 2016-09-15 LAB — CYTOLOGY - PAP
DIAGNOSIS: NEGATIVE
HPV (WINDOPATH): NOT DETECTED

## 2016-12-28 DIAGNOSIS — M21611 Bunion of right foot: Secondary | ICD-10-CM | POA: Diagnosis not present

## 2016-12-28 DIAGNOSIS — M79671 Pain in right foot: Secondary | ICD-10-CM | POA: Diagnosis not present

## 2016-12-28 DIAGNOSIS — M25571 Pain in right ankle and joints of right foot: Secondary | ICD-10-CM | POA: Diagnosis not present

## 2016-12-28 DIAGNOSIS — M7751 Other enthesopathy of right foot: Secondary | ICD-10-CM | POA: Diagnosis not present

## 2017-03-22 DIAGNOSIS — Z5181 Encounter for therapeutic drug level monitoring: Secondary | ICD-10-CM | POA: Diagnosis not present

## 2017-04-11 DIAGNOSIS — F3173 Bipolar disorder, in partial remission, most recent episode manic: Secondary | ICD-10-CM | POA: Diagnosis not present

## 2017-05-15 DIAGNOSIS — R5383 Other fatigue: Secondary | ICD-10-CM | POA: Diagnosis not present

## 2017-05-15 DIAGNOSIS — E559 Vitamin D deficiency, unspecified: Secondary | ICD-10-CM | POA: Diagnosis not present

## 2017-05-15 DIAGNOSIS — E039 Hypothyroidism, unspecified: Secondary | ICD-10-CM | POA: Diagnosis not present

## 2017-05-15 DIAGNOSIS — N945 Secondary dysmenorrhea: Secondary | ICD-10-CM | POA: Diagnosis not present

## 2017-05-21 ENCOUNTER — Other Ambulatory Visit: Payer: Self-pay | Admitting: Family Medicine

## 2017-05-21 DIAGNOSIS — Z1231 Encounter for screening mammogram for malignant neoplasm of breast: Secondary | ICD-10-CM

## 2017-05-23 ENCOUNTER — Encounter: Payer: Self-pay | Admitting: Family Medicine

## 2017-05-25 NOTE — Telephone Encounter (Signed)
Copied from [place] [id number]. Topic: Inquiry >> May 21, 2017 11:04 AM Oliver Pila B wrote: Reason for CRM: pt called to speak w/ pcp or nurse about a growth that she has on her nipple, not breast but on her nipple, pt couldn't get in this week due to availability of pcp, call to advise  >> May 21, 2017  4:45 PM Mcneil, Ja-[place]wan wrote: Pt called back requesting to know when she would be contacted. Advised pt that a message was sent to contact her.

## 2017-05-28 NOTE — Telephone Encounter (Signed)
Spoke with patient and made appointment for 05/29/17

## 2017-05-29 ENCOUNTER — Ambulatory Visit: Payer: BLUE CROSS/BLUE SHIELD | Admitting: Family Medicine

## 2017-05-29 VITALS — BP 110/68 | HR 61 | Temp 98.2°F | Resp 18 | Wt 146.8 lb

## 2017-05-29 DIAGNOSIS — N649 Disorder of breast, unspecified: Secondary | ICD-10-CM | POA: Diagnosis not present

## 2017-05-29 DIAGNOSIS — F319 Bipolar disorder, unspecified: Secondary | ICD-10-CM | POA: Diagnosis not present

## 2017-05-29 NOTE — Progress Notes (Signed)
Subjective:  I acted as a Education administrator for Dr. Charlett Blake. [place], [place]  Patient ID: Cheyenne Garner, female    DOB: Jul 30, 1965, 52 y.o.   MRN: [id number]  No chief complaint on file.   HPI  Patient is in today for an acute visit due to concerns regarding a lesion she has just noticed on her right nipple. There is no discharge, redness or warmth. Notes a history of fibrocystic breast. She noted the lesion about a week ago and it has not changed. No systemic symptoms noted. No anorexia, fatigue. Denies CP/palp/SOB/HA/congestion/fevers/GI or GU c/o. Taking meds as prescribed  Patient Care Team: Mosie Lukes, MD as PCP - General (Family Medicine)   Past Medical History:  Diagnosis Date  . Asthma    cough variant asthma developed in last couple of years  . Bipolar 1 disorder ([place])   . Cervical cancer screening 09/12/2016  . Colon polyp 09/12/2016  . Hx of colonic polyp 09/12/2016   Colonoscopy 2017 with 1 small polyp, done by Dr [name], repeat colonoscopy in 5 years, 2022  . Preventative health care 07/23/2015  . Thyroid disease     Past Surgical History:  Procedure Laterality Date  . APPENDECTOMY    . SKIN BIOPSY     back moles removed (precancer?)    Family History  Problem Relation Age of Onset  . Arthritis Mother   . Hypertension Father   . Heart disease Father        MI, s/p triple bypass at age 60  . Neuropathy Father        toxic peripheral   . Cancer Paternal Grandfather        young  . Cholelithiasis Sister   . Mental retardation Sister        ADD, schizoaffective d/o  . Obesity Sister   . Arthritis Brother   . Cancer Cousin 40       ovarian    Social History   Socioeconomic History  . Marital status: Married    Spouse name: Not on file  . Number of children: Not on file  . Years of education: Not on file  . Highest education level: Not on file  Occupational History  . Not on file  Social Needs  . Financial resource strain: Not on file  . Food insecurity:   Worry: Not on file    Inability: Not on file  . Transportation needs:    Medical: Not on file    Non-medical: Not on file  Tobacco Use  . Smoking status: Never Smoker  . Smokeless tobacco: Never Used  Substance and Sexual Activity  . Alcohol use: No  . Drug use: No  . Sexual activity: Never    Birth control/protection: None  Lifestyle  . Physical activity:    Days per week: Not on file    Minutes per session: Not on file  . Stress: Not on file  Relationships  . Social connections:    Talks on phone: Not on file    Gets together: Not on file    Attends religious service: Not on file    Active member of club or organization: Not on file    Attends meetings of clubs or organizations: Not on file    Relationship status: Not on file  . Intimate partner violence:    Fear of current or ex partner: Not on file    Emotionally abused: Not on file    Physically abused: Not on file  Forced sexual activity: Not on file  Other Topics Concern  . 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 No major dietary restrictions    Outpatient Medications Prior to Visit  Medication Sig Dispense Refill  . Cholecalciferol (VITAMIN D3) 5000 units CAPS Take 1 capsule by mouth daily.    . Estradiol-Estriol-Progesterone (BIEST/PROGESTERONE) CREA Place onto the skin 2 (two) times daily.     Marland Kitchen lithium 300 MG tablet Take 300 mg by mouth 2 (two) times daily.    Marland Kitchen NATURE-THROID 130 MG tablet Take 1 tablet by mouth daily.  2  . Probiotic Product (PROBIOTIC-10 PO) Take 1 capsule by mouth daily.     No facility-administered medications prior to visit.     Allergies  Allergen Reactions  . Gentamycin [Gentamicin]     Reacted to eye ointment, swelling red painful eyes    Review of Systems  Constitutional: Negative for fever and malaise/fatigue.  HENT: Negative for congestion.   Eyes: Negative for blurred vision.  Respiratory: Negative  for shortness of breath.   Cardiovascular: Negative for chest pain, palpitations and leg swelling.  Gastrointestinal: Negative for abdominal pain, blood in stool and nausea.  Genitourinary: Negative for dysuria and frequency.  Musculoskeletal: Negative for falls.  Skin: Negative for rash.  Neurological: Negative for dizziness, loss of consciousness and headaches.  Endo/Heme/Allergies: Negative for environmental allergies.  Psychiatric/Behavioral: Negative for depression. The patient is not nervous/anxious.        Objective:    Physical Exam  Constitutional: She is oriented to person, place, and time. No distress.  HENT:  Head: Normocephalic and atraumatic.  Eyes: Conjunctivae are normal.  Neck: Neck supple. No thyromegaly present.  Cardiovascular: Normal rate, regular rhythm and normal heart sounds.  No murmur heard. Pulmonary/Chest: Effort normal and breath sounds normal. She has no wheezes.  Abdominal: She exhibits no distension and no mass.  Musculoskeletal: She exhibits no edema.  Lymphadenopathy:    She has no cervical adenopathy.  Neurological: She is alert and oriented to person, place, and time.  Skin: Skin is warm and dry. No rash noted. She is not diaphoretic.  Psychiatric: Judgment normal.    BP 110/68 (BP Location: Left Arm, Patient Position: Sitting, Cuff Size: Normal)   Pulse 61   Temp 98.2 F (36.8 C) (Oral)   Resp 18   Wt 146 lb 12.8 oz (66.6 kg)   SpO2 98%   BMI 23.34 kg/m  Wt Readings from Last 3 Encounters:  05/29/17 146 lb 12.8 oz (66.6 kg)  09/12/16 154 lb 3.2 oz (69.9 kg)  12/01/15 153 lb (69.4 kg)   BP Readings from Last 3 Encounters:  05/29/17 110/68  09/12/16 114/70  12/01/15 116/76     Immunization History  Administered Date(s) Administered  . Influenza-Unspecified 10/01/2015    Health Maintenance  Topic Date Due  . HIV Screening  02/21/1980  . TETANUS/TDAP  02/21/1984  . INFLUENZA VACCINE  08/16/2017  . MAMMOGRAM  10/18/2017  .  PAP SMEAR  09/13/2019  . COLONOSCOPY  02/23/2025    Lab Results  Component Value Date   WBC 5.0 07/23/2015   HGB 14.0 07/23/2015   HCT 41.7 07/23/2015   PLT 163.0 07/23/2015   GLUCOSE 85 07/23/2015   CHOL 176 07/23/2015   TRIG 65.0 07/23/2015   HDL 68.40 07/23/2015   LDLCALC 95 07/23/2015   ALT 14 07/23/2015   AST 16 07/23/2015  NA 139 07/23/2015   K 4.3 07/23/2015   CL 104 07/23/2015   CREATININE 0.89 07/23/2015   BUN 18 07/23/2015   CO2 33 (H) 07/23/2015   TSH 3.80 07/23/2015    Lab Results  Component Value Date   TSH 3.80 07/23/2015   Lab Results  Component Value Date   WBC 5.0 07/23/2015   HGB 14.0 07/23/2015   HCT 41.7 07/23/2015   MCV 88.4 07/23/2015   PLT 163.0 07/23/2015   Lab Results  Component Value Date   NA 139 07/23/2015   K 4.3 07/23/2015   CO2 33 (H) 07/23/2015   GLUCOSE 85 07/23/2015   BUN 18 07/23/2015   CREATININE 0.89 07/23/2015   BILITOT 0.7 07/23/2015   ALKPHOS 65 07/23/2015   AST 16 07/23/2015   ALT 14 07/23/2015   PROT 6.7 07/23/2015   ALBUMIN 4.2 07/23/2015   CALCIUM 9.7 07/23/2015   ANIONGAP 5 06/25/2014   GFR 71.24 07/23/2015   Lab Results  Component Value Date   CHOL 176 07/23/2015   Lab Results  Component Value Date   HDL 68.40 07/23/2015   Lab Results  Component Value Date   LDLCALC 95 07/23/2015   Lab Results  Component Value Date   TRIG 65.0 07/23/2015   Lab Results  Component Value Date   CHOLHDL 3 07/23/2015   No results found for: HGBA1C       Assessment & Plan:   Problem List Items Addressed This Visit    Bipolar 1 disorder ([place])    Doing well on lithium at the current time      Lesion of breast - Primary    Right breast on nipple and another lesion at 12 oclock in left breast. Ultrasound and mgm ordered for patient to further evaluate.       Relevant Orders   MM DIAG BREAST TOMO BILATERAL   US BREAST LTD UNI LEFT INC AXILLA   US BREAST LTD UNI RIGHT INC AXILLA      I am having Cheyenne Garner maintain her lithium, BIEST/PROGESTERONE, NATURE-THROID, Probiotic Product (PROBIOTIC-10 PO), and Vitamin D3.  No orders of the defined types were placed in this encounter.   CMA served as Education administrator during this visit. History, Physical and Plan performed by medical provider. Documentation and orders reviewed and attested to.  Penni Homans, MD

## 2017-05-29 NOTE — Patient Instructions (Signed)
Fibrocystic Breast Changes  Fibrocystic breast changes are changes that can make your breasts swollen or painful. These changes happen when tiny sacs of fluid (cysts) form in the breast. This is a common condition. It does not mean that you have cancer. It usually happens because of hormone changes during a monthly period.  Follow these instructions at home:   Check your breasts after every monthly period. If you do not have monthly periods, check your breasts on the first day of every month. Check for:  ? Soreness.  ? New swelling or puffiness.  ? A change in breast size.  ? A change in a lump that was already there.   Take over-the-counter and prescription medicines only as told by your doctor.   Wear a support or sports bra that fits well. Wear this support especially when you are exercising.   Avoid or have less caffeine, fat, and sugar in what you eat and drink as told by your doctor.  Contact a doctor if:   You have fluid coming from your nipple, especially if the fluid has blood in it.   You have new lumps or bumps in your breast.   Your breast gets puffy, red, and painful.   You have changes in how your breast looks.   Your nipple looks flat or it sinks into your breast.  Get help right away if:   Your breast turns red, and the redness is spreading.  Summary   Fibrocystic breast changes are changes that can make your breasts swollen or painful.   This condition can happen when you have hormone changes during your monthly period.   With this condition, it is important to check your breasts after every monthly period. If you do not have monthly periods, check your breasts on the first day of every month.  This information is not intended to replace advice given to you by your health care provider. Make sure you discuss any questions you have with your health care provider.  Document Released: 12/16/2007 Document Revised: 09/16/2015 Document Reviewed: 09/16/2015  Elsevier Interactive Patient  Education  2017 [employer]

## 2017-06-03 DIAGNOSIS — N649 Disorder of breast, unspecified: Secondary | ICD-10-CM | POA: Insufficient documentation

## 2017-06-03 NOTE — Assessment & Plan Note (Signed)
Doing well on lithium at the current time

## 2017-06-03 NOTE — Assessment & Plan Note (Signed)
Right breast on nipple and another lesion at 12 oclock in left breast. Ultrasound and mgm ordered for patient to further evaluate.

## 2017-06-06 ENCOUNTER — Ambulatory Visit: Admission: RE | Admit: 2017-06-06 | Payer: BLUE CROSS/BLUE SHIELD | Source: Ambulatory Visit

## 2017-06-06 ENCOUNTER — Ambulatory Visit
Admission: RE | Admit: 2017-06-06 | Discharge: 2017-06-06 | Disposition: A | Discharge status: Home / Self Care | Payer: BLUE CROSS/BLUE SHIELD | Source: Ambulatory Visit | Attending: Family Medicine | Admitting: Family Medicine

## 2017-06-06 ENCOUNTER — Ambulatory Visit: Payer: BLUE CROSS/BLUE SHIELD

## 2017-06-06 DIAGNOSIS — R922 Inconclusive mammogram: Secondary | ICD-10-CM | POA: Diagnosis not present

## 2017-06-06 DIAGNOSIS — N649 Disorder of breast, unspecified: Secondary | ICD-10-CM

## 2017-06-13 DIAGNOSIS — N6489 Other specified disorders of breast: Secondary | ICD-10-CM | POA: Diagnosis not present

## 2017-06-29 DIAGNOSIS — M21611 Bunion of right foot: Secondary | ICD-10-CM | POA: Diagnosis not present

## 2017-06-29 DIAGNOSIS — E063 Autoimmune thyroiditis: Secondary | ICD-10-CM | POA: Diagnosis not present

## 2017-06-29 DIAGNOSIS — N943 Premenstrual tension syndrome: Secondary | ICD-10-CM | POA: Diagnosis not present

## 2017-07-22 DIAGNOSIS — H00025 Hordeolum internum left lower eyelid: Secondary | ICD-10-CM | POA: Diagnosis not present

## 2017-07-24 DIAGNOSIS — H04123 Dry eye syndrome of bilateral lacrimal glands: Secondary | ICD-10-CM | POA: Diagnosis not present

## 2017-07-24 DIAGNOSIS — H01029 Squamous blepharitis unspecified eye, unspecified eyelid: Secondary | ICD-10-CM | POA: Diagnosis not present

## 2017-08-08 DIAGNOSIS — F3111 Bipolar disorder, current episode manic without psychotic features, mild: Secondary | ICD-10-CM | POA: Diagnosis not present

## 2017-09-14 ENCOUNTER — Encounter: Payer: BLUE CROSS/BLUE SHIELD | Admitting: Family Medicine

## 2017-10-02 DIAGNOSIS — Z5181 Encounter for therapeutic drug level monitoring: Secondary | ICD-10-CM | POA: Diagnosis not present

## 2017-11-06 ENCOUNTER — Ambulatory Visit (INDEPENDENT_AMBULATORY_CARE_PROVIDER_SITE_OTHER): Payer: BLUE CROSS/BLUE SHIELD | Admitting: Family Medicine

## 2017-11-06 ENCOUNTER — Encounter: Payer: Self-pay | Admitting: Family Medicine

## 2017-11-06 VITALS — BP 100/72 | HR 71 | Temp 98.2°F | Resp 18 | Ht 66.5 in | Wt 138.4 lb

## 2017-11-06 DIAGNOSIS — N649 Disorder of breast, unspecified: Secondary | ICD-10-CM

## 2017-11-06 DIAGNOSIS — E079 Disorder of thyroid, unspecified: Secondary | ICD-10-CM | POA: Diagnosis not present

## 2017-11-06 DIAGNOSIS — J069 Acute upper respiratory infection, unspecified: Secondary | ICD-10-CM

## 2017-11-06 DIAGNOSIS — J45909 Unspecified asthma, uncomplicated: Secondary | ICD-10-CM

## 2017-11-06 DIAGNOSIS — Z0000 Encounter for general adult medical examination without abnormal findings: Secondary | ICD-10-CM | POA: Diagnosis not present

## 2017-11-06 DIAGNOSIS — L988 Other specified disorders of the skin and subcutaneous tissue: Secondary | ICD-10-CM

## 2017-11-06 MED ORDER — HYDROCODONE-HOMATROPINE 5-1.5 MG/5ML PO SYRP: 5.0000 mL | ORAL_SOLUTION | Freq: Three times a day (TID) | ORAL | 0 refills | Status: DC | PRN

## 2017-11-06 NOTE — Patient Instructions (Addendum)
Encouraged increased rest and hydration, add probiotics, zinc such as Coldeze or Xicam. Treat fevers as needed, md daily vitamin C 500 to 1000  Claritin up to twice daily with allergies. Famotidine 20 mg twice daily  shingrix is the new shingles shot, 1 shot and repeat shot at 2-6 months later.   Check with insurance to see if shingrix shingles shot, 2 shots over 2-6 months    Preventive Care 40-64 Years, Female Preventive care refers to lifestyle choices and visits with your health care provider that can promote health and wellness. What does preventive care include?  A yearly physical exam. This is also called an annual well check.  Dental exams once or twice a year.  Routine eye exams. Ask your health care provider how often you should have your eyes checked.  Personal lifestyle choices, including: ? Daily care of your teeth and gums. ? Regular physical activity. ? Eating a healthy diet. ? Avoiding tobacco and drug use. ? Limiting alcohol use. ? Practicing safe sex. ? Taking low-dose aspirin daily starting at age 39. ? Taking vitamin and mineral supplements as recommended by your health care provider. What happens during an annual well check? The services and screenings done by your health care provider during your annual well check will depend on your age, overall health, lifestyle risk factors, and family history of disease. Counseling Your health care provider may ask you questions about your:  Alcohol use.  Tobacco use.  Drug use.  Emotional well-being.  Home and relationship well-being.  Sexual activity.  Eating habits.  Work and work Statistician.  Method of birth control.  Menstrual cycle.  Pregnancy history.  Screening You may have the following tests or measurements:  Height, weight, and BMI.  Blood pressure.  Lipid and cholesterol levels. These may be checked every 5 years, or more frequently if you are over 2 years old.  Skin  check.  Lung cancer screening. You may have this screening every year starting at age 71 if you have a 30-pack-year history of smoking and currently smoke or have quit within the past 15 years.  Fecal occult blood test (FOBT) of the stool. You may have this test every year starting at age 14.  Flexible sigmoidoscopy or colonoscopy. You may have a sigmoidoscopy every 5 years or a colonoscopy every 10 years starting at age 66.  Hepatitis C blood test.  Hepatitis B blood test.  Sexually transmitted disease (STD) testing.  Diabetes screening. This is done by checking your blood sugar (glucose) after you have not eaten for a while (fasting). You may have this done every 1-3 years.  Mammogram. This may be done every 1-2 years. Talk to your health care provider about when you should start having regular mammograms. This may depend on whether you have a family history of breast cancer.  BRCA-related cancer screening. This may be done if you have a family history of breast, ovarian, tubal, or peritoneal cancers.  Pelvic exam and Pap test. This may be done every 3 years starting at age 17. Starting at age 46, this may be done every 5 years if you have a Pap test in combination with an HPV test.  Bone density scan. This is done to screen for osteoporosis. You may have this scan if you are at high risk for osteoporosis.  Discuss your test results, treatment options, and if necessary, the need for more tests with your health care provider. Vaccines Your health care provider may recommend certain vaccines,  such as:  Influenza vaccine. This is recommended every year.  Tetanus, diphtheria, and acellular pertussis (Tdap, Td) vaccine. You may need a Td booster every 10 years.  Varicella vaccine. You may need this if you have not been vaccinated.  Zoster vaccine. You may need this after age 89.  Measles, mumps, and rubella (MMR) vaccine. You may need at least one dose of MMR if you were born in 1957  or later. You may also need a second dose.  Pneumococcal 13-valent conjugate (PCV13) vaccine. You may need this if you have certain conditions and were not previously vaccinated.  Pneumococcal polysaccharide (PPSV23) vaccine. You may need one or two doses if you smoke cigarettes or if you have certain conditions.  Meningococcal vaccine. You may need this if you have certain conditions.  Hepatitis A vaccine. You may need this if you have certain conditions or if you travel or work in places where you may be exposed to hepatitis A.  Hepatitis B vaccine. You may need this if you have certain conditions or if you travel or work in places where you may be exposed to hepatitis B.  Haemophilus influenzae type b (Hib) vaccine. You may need this if you have certain conditions.  Talk to your health care provider about which screenings and vaccines you need and how often you need them. This information is not intended to replace advice given to you by your health care provider. Make sure you discuss any questions you have with your health care provider. Document Released: 01/29/2015 Document Revised: 09/22/2015 Document Reviewed: 11/03/2014 Elsevier Interactive Patient Education  [employer].

## 2017-11-06 NOTE — Assessment & Plan Note (Signed)
Cough has flared.. Encouraged increased rest and hydration, add probiotics, zinc such as Coldeze or Xicam. Treat fevers as needed

## 2017-11-06 NOTE — Assessment & Plan Note (Addendum)
Patient encouraged to maintain heart healthy diet, regular exercise, adequate sleep. Consider daily probiotics. Take medications as prescribed. Given and reviewed copy of ACP documents from [place] Secretary of State and encouraged to complete and return 

## 2017-11-07 LAB — CBC
HCT: 41.3 % (ref 36.0–46.0)
HEMOGLOBIN: 13.9 g/dL (ref 12.0–15.0)
MCHC: 33.6 g/dL (ref 30.0–36.0)
MCV: 90.1 fl (ref 78.0–100.0)
Platelets: 171 10*3/uL (ref 150.0–400.0)
RBC: 4.58 Mil/uL (ref 3.87–5.11)
RDW: 12.5 % (ref 11.5–15.5)
WBC: 7.3 10*3/uL (ref 4.0–10.5)

## 2017-11-07 LAB — COMPREHENSIVE METABOLIC PANEL
ALBUMIN: 4.2 g/dL (ref 3.5–5.2)
ALT: 11 U/L (ref 0–35)
AST: 11 U/L (ref 0–37)
Alkaline Phosphatase: 61 U/L (ref 39–117)
BUN: 22 mg/dL (ref 6–23)
CALCIUM: 9.5 mg/dL (ref 8.4–10.5)
CHLORIDE: 104 meq/L (ref 96–112)
CO2: 29 meq/L (ref 19–32)
CREATININE: 0.99 mg/dL (ref 0.40–1.20)
GFR: 62.43 mL/min (ref >60.00)
Glucose, Bld: 97 mg/dL (ref 70–99)
Potassium: 4 mEq/L (ref 3.5–5.1)
Sodium: 140 mEq/L (ref 135–145)
Total Bilirubin: 0.5 mg/dL (ref 0.2–1.2)
Total Protein: 6.5 g/dL (ref 6.0–8.3)

## 2017-11-07 LAB — LIPID PANEL
Cholesterol: 146 mg/dL (ref 0–200)
HDL: 56.9 mg/dL (ref >39.00)
LDL Cholesterol: 67 mg/dL (ref 0–99)
NonHDL: 89.23
Total CHOL/HDL Ratio: 3
Triglycerides: 109 mg/dL (ref 0.0–149.0)
VLDL: 21.8 mg/dL (ref 0.0–40.0)

## 2017-11-07 LAB — TSH: TSH: 0.26 u[IU]/mL — ABNORMAL LOW (ref 0.35–4.50)

## 2017-11-11 DIAGNOSIS — J069 Acute upper respiratory infection, unspecified: Secondary | ICD-10-CM | POA: Insufficient documentation

## 2017-11-11 NOTE — Assessment & Plan Note (Signed)
On surface of right nipple. Imaging all normal so far. She will consult with dermatology and if this does not resolve and symptoms persist will need further work up.

## 2017-11-11 NOTE — Progress Notes (Signed)
Subjective:    Patient ID: Cheyenne Garner, female    DOB: [birth year], 52 y.o.   MRN: [id number]  No chief complaint on file.   HPI Patient is in today for annual preventative exam. She is feeling well today but is noting a lesion on her right breast. No surrounding fluctuance or pain. No discharge or fevers chills. No difficulties with activities of daily living. She is eating well and staying active. No fatigue. Took her flu shot on 9/15. Tolerated this well. She did have some congestion and cough in September but these symptoms have resolved. Denies CP/palp/SOB/HA/congestion/fevers/GI or GU c/o. Taking meds as prescribed  Past Medical History:  Diagnosis Date  . Asthma    cough variant asthma developed in last couple of years  . Bipolar 1 disorder ([place])   . Cervical cancer screening 09/12/2016  . Colon polyp 09/12/2016  . Hx of colonic polyp 09/12/2016   Colonoscopy 2017 with 1 small polyp, done by Dr [name], repeat colonoscopy in 5 years, 2022  . Preventative health care 07/23/2015  . Thyroid disease     Past Surgical History:  Procedure Laterality Date  . APPENDECTOMY    . SKIN BIOPSY     back moles removed (precancer?)    Family History  Problem Relation Age of Onset  . Arthritis Mother   . Hypertension Father   . Heart disease Father        MI, s/p triple bypass at age 70  . Neuropathy Father        toxic peripheral   . Cancer Paternal Grandfather        young  . Cholelithiasis Sister   . Mental retardation Sister        ADD, schizoaffective d/o  . Obesity Sister   . Arthritis Brother   . Cancer Cousin 40       ovarian    Social History   Socioeconomic History  . Marital status: Married    Spouse name: Not on file  . Number of children: Not on file  . Years of education: Not on file  . Highest education level: Not on file  Occupational History  . Not on file  Social Needs  . Financial resource strain: Not on file  . Food insecurity:    Worry: Not on  file    Inability: Not on file  . Transportation needs:    Medical: Not on file    Non-medical: Not on file  Tobacco Use  . Smoking status: Never Smoker  . Smokeless tobacco: Never Used  Substance and Sexual Activity  . Alcohol use: No  . Drug use: No  . Sexual activity: Never    Birth control/protection: None  Lifestyle  . Physical activity:    Days per week: Not on file    Minutes per session: Not on file  . Stress: Not on file  Relationships  . Social connections:    Talks on phone: Not on file    Gets together: Not on file    Attends religious service: Not on file    Active member of club or organization: Not on file    Attends meetings of clubs or organizations: Not on file    Relationship status: Not on file  . Intimate partner violence:    Fear of current or ex partner: Not on file    Emotionally abused: Not on file    Physically abused: Not on file    Forced sexual activity: Not  on file  Other Topics Concern  . 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 No major dietary restrictions    Outpatient Medications Prior to Visit  Medication Sig Dispense Refill  . Cholecalciferol (VITAMIN D3) 5000 units CAPS Take 1 capsule by mouth daily.    . Estradiol-Estriol-Progesterone (BIEST/PROGESTERONE) CREA Place onto the skin 2 (two) times daily.     Marland Kitchen lithium 300 MG tablet Take 300 mg by mouth 2 (two) times daily.    Marland Kitchen NATURE-THROID 130 MG tablet Take 1 tablet by mouth daily.  2  . Probiotic Product (PROBIOTIC-10 PO) Take 1 capsule by mouth daily.     No facility-administered medications prior to visit.     Allergies  Allergen Reactions  . Gentamycin [Gentamicin]     Reacted to eye ointment, swelling red painful eyes    Review of Systems  Constitutional: Negative for fever.  HENT: Negative for congestion.   Eyes: Negative for blurred vision.  Respiratory: Negative for cough.   Cardiovascular:  Negative for chest pain and palpitations.  Gastrointestinal: Negative for vomiting.  Musculoskeletal: Negative for back pain.  Skin: Negative for rash.  Neurological: Negative for loss of consciousness and headaches.       Objective:    Physical Exam  Constitutional: She is oriented to person, place, and time. She appears well-developed and well-nourished. No distress.  HENT:  Head: Normocephalic and atraumatic.  Eyes: Conjunctivae are normal.  Neck: Neck supple. No thyromegaly present.  Cardiovascular: Normal rate, regular rhythm and normal heart sounds.  No murmur heard. Pulmonary/Chest: Effort normal and breath sounds normal. No respiratory distress.  Abdominal: Soft. Bowel sounds are normal. She exhibits no distension and no mass. There is no tenderness.  Musculoskeletal: She exhibits no edema.  Lymphadenopathy:    She has no cervical adenopathy.  Neurological: She is alert and oriented to person, place, and time.  Skin: Skin is warm and dry.  Psychiatric: She has a normal mood and affect. Her behavior is normal.    BP 100/72 (BP Location: Left Arm, Patient Position: Sitting, Cuff Size: Normal)   Pulse 71   Temp 98.2 F (36.8 C) (Oral)   Resp 18   Ht 5' 6.5" (1.689 m)   Wt 138 lb 6.4 oz (62.8 kg)   SpO2 98%   BMI 22.00 kg/m  Wt Readings from Last 3 Encounters:  11/06/17 138 lb 6.4 oz (62.8 kg)  05/29/17 146 lb 12.8 oz (66.6 kg)  09/12/16 154 lb 3.2 oz (69.9 kg)     Lab Results  Component Value Date   WBC 7.3 11/06/2017   HGB 13.9 11/06/2017   HCT 41.3 11/06/2017   PLT 171.0 11/06/2017   GLUCOSE 97 11/06/2017   CHOL 146 11/06/2017   TRIG 109.0 11/06/2017   HDL 56.90 11/06/2017   LDLCALC 67 11/06/2017   ALT 11 11/06/2017   AST 11 11/06/2017   NA 140 11/06/2017   K 4.0 11/06/2017   CL 104 11/06/2017   CREATININE 0.99 11/06/2017   BUN 22 11/06/2017   CO2 29 11/06/2017   TSH 0.26 (L) 11/06/2017    Lab Results  Component Value Date   TSH 0.26 (L)  11/06/2017   Lab Results  Component Value Date   WBC 7.3 11/06/2017   HGB 13.9 11/06/2017   HCT 41.3 11/06/2017   MCV 90.1 11/06/2017   PLT 171.0 11/06/2017   Lab Results  Component  Value Date   NA 140 11/06/2017   K 4.0 11/06/2017   CO2 29 11/06/2017   GLUCOSE 97 11/06/2017   BUN 22 11/06/2017   CREATININE 0.99 11/06/2017   BILITOT 0.5 11/06/2017   ALKPHOS 61 11/06/2017   AST 11 11/06/2017   ALT 11 11/06/2017   PROT 6.5 11/06/2017   ALBUMIN 4.2 11/06/2017   CALCIUM 9.5 11/06/2017   ANIONGAP 5 06/25/2014   GFR 62.43 11/06/2017   Lab Results  Component Value Date   CHOL 146 11/06/2017   Lab Results  Component Value Date   HDL 56.90 11/06/2017   Lab Results  Component Value Date   LDLCALC 67 11/06/2017   Lab Results  Component Value Date   TRIG 109.0 11/06/2017   Lab Results  Component Value Date   CHOLHDL 3 11/06/2017   No results found for: HGBA1C     Assessment & Plan:   Problem List Items Addressed This Visit    Asthma    Cough has flared.. Encouraged increased rest and hydration, add probiotics, zinc such as Coldeze or Xicam. Treat fevers as needed      Relevant Orders   CBC (Completed)   Thyroid disease   Relevant Orders   TSH   T4, free   Preventative health care    Patient encouraged to maintain heart healthy diet, regular exercise, adequate sleep. Consider daily probiotics. Take medications as prescribed. Given and reviewed copy of ACP documents from [place] of State and encouraged to complete and return      Relevant Orders   Lipid panel (Completed)   TSH (Completed)   Comprehensive metabolic panel (Completed)   Lesion of breast    On surface of right nipple. Imaging all normal so far. She will consult with dermatology and if this does not resolve and symptoms persist will need further work up.      URI (upper respiratory infection)    Encouraged increased rest and hydration, add probiotics, zinc such as Coldeze or Xicam. Treat  fevers as needed       Other Visit Diagnoses    Skin lesion of breast    -  Primary   Relevant Orders   Ambulatory referral to Dermatology      I am having Jalesha L. Coupe start on HYDROcodone-homatropine. I am also having her maintain her lithium, BIEST/PROGESTERONE, NATURE-THROID, Probiotic Product (PROBIOTIC-10 PO), and Vitamin D3.  Meds ordered this encounter  Medications  . HYDROcodone-homatropine (HYCODAN) 5-1.5 MG/5ML syrup    Sig: Take 5 mLs by mouth every 8 (eight) hours as needed for cough.    Dispense:  120 mL    Refill:  0     Penni Homans, MD

## 2017-11-11 NOTE — Assessment & Plan Note (Signed)
Encouraged increased rest and hydration, add probiotics, zinc such as Coldeze or Xicam. Treat fevers as needed 

## 2017-12-17 DIAGNOSIS — N945 Secondary dysmenorrhea: Secondary | ICD-10-CM | POA: Diagnosis not present

## 2017-12-17 DIAGNOSIS — R5383 Other fatigue: Secondary | ICD-10-CM | POA: Diagnosis not present

## 2017-12-17 DIAGNOSIS — E039 Hypothyroidism, unspecified: Secondary | ICD-10-CM | POA: Diagnosis not present

## 2017-12-31 DIAGNOSIS — R5383 Other fatigue: Secondary | ICD-10-CM | POA: Diagnosis not present

## 2017-12-31 DIAGNOSIS — B379 Candidiasis, unspecified: Secondary | ICD-10-CM | POA: Diagnosis not present

## 2017-12-31 DIAGNOSIS — E039 Hypothyroidism, unspecified: Secondary | ICD-10-CM | POA: Diagnosis not present

## 2017-12-31 DIAGNOSIS — N951 Menopausal and female climacteric states: Secondary | ICD-10-CM | POA: Diagnosis not present

## 2018-01-31 DIAGNOSIS — F3111 Bipolar disorder, current episode manic without psychotic features, mild: Secondary | ICD-10-CM | POA: Diagnosis not present

## 2018-02-14 DIAGNOSIS — L720 Epidermal cyst: Secondary | ICD-10-CM | POA: Diagnosis not present

## 2018-02-14 DIAGNOSIS — N6001 Solitary cyst of right breast: Secondary | ICD-10-CM | POA: Diagnosis not present

## 2018-04-24 ENCOUNTER — Telehealth: Payer: Self-pay

## 2018-04-24 NOTE — Telephone Encounter (Signed)
Copied from [place] [id number]. Topic: Appointment Scheduling - Scheduling Inquiry for Clinic >> Apr 24, 2018 10:30 AM Cheyenne Garner wrote: Reason for CRM: Pt wants to cancel her appt on 4.23.20 / please cancel

## 2018-04-30 NOTE — Telephone Encounter (Signed)
Spoke with patient and let her know we will keep appt it will just a virtual  visit. She agreed

## 2018-05-09 ENCOUNTER — Other Ambulatory Visit: Payer: Self-pay

## 2018-05-09 ENCOUNTER — Ambulatory Visit (INDEPENDENT_AMBULATORY_CARE_PROVIDER_SITE_OTHER): Payer: BLUE CROSS/BLUE SHIELD | Admitting: Family Medicine

## 2018-05-09 DIAGNOSIS — F319 Bipolar disorder, unspecified: Secondary | ICD-10-CM

## 2018-05-09 DIAGNOSIS — E079 Disorder of thyroid, unspecified: Secondary | ICD-10-CM

## 2018-05-09 DIAGNOSIS — J45909 Unspecified asthma, uncomplicated: Secondary | ICD-10-CM

## 2018-05-09 MED ORDER — ALBUTEROL SULFATE HFA 108 (90 BASE) MCG/ACT IN AERS: 2.0000 | INHALATION_SPRAY | Freq: Four times a day (QID) | RESPIRATORY_TRACT | 2 refills | Status: DC | PRN

## 2018-05-09 MED ORDER — THYROID 113.75 MG PO TABS: 113.7500 mg | ORAL_TABLET | ORAL | Status: DC

## 2018-05-12 NOTE — Assessment & Plan Note (Signed)
Had her dose of Nature Throid decreased recently to 113 mg and is feeling better was over treated and had noted an increase in an anxious uneasy feeling and some palpitations but that has all improved.

## 2018-05-12 NOTE — Progress Notes (Signed)
Virtual Visit via Video Note  I connected with Cheyenne Garner on 05/12/18 at  1:20 PM EDT by a video enabled telemedicine application and verified that I am speaking with the correct person using two identifiers.   I discussed the limitations of evaluation and management by telemedicine and the availability of in person appointments. The patient expressed understanding and agreed to proceed. Magdalene Molly, CMA was able to get patient on Video platform.    Subjective:    Patient ID: Cheyenne Garner, female    DOB: 07/17/1965, 53 y.o.   MRN: [id number]  No chief complaint on file.   HPI Patient is in today for follow up on chronic medical concerns including thyroid disease, asthma and bipolar disorder. She feels well. No recent febrile illness or hospitalizations. Had her dose of Nature Throid decreased recently to 113 mg and is feeling better was over treated and had noted an increase in an anxious uneasy feeling and some palpitations but that has all improved. Asthma has not flared recently, bipolar disorder is well controlled on Lithium. Denies CP/palp/SOB/HA/congestion/fevers/GI or GU c/o. Taking meds as prescribed   Past Medical History:  Diagnosis Date  . Asthma    cough variant asthma developed in last couple of years  . Bipolar 1 disorder ([place])   . Cervical cancer screening 09/12/2016  . Colon polyp 09/12/2016  . Hx of colonic polyp 09/12/2016   Colonoscopy 2017 with 1 small polyp, done by Dr [name], repeat colonoscopy in 5 years, 2022  . Preventative health care 07/23/2015  . Thyroid disease     Past Surgical History:  Procedure Laterality Date  . APPENDECTOMY    . SKIN BIOPSY     back moles removed (precancer?)    Family History  Problem Relation Age of Onset  . Arthritis Mother   . Hypertension Father   . Heart disease Father        MI, s/p triple bypass at age 47  . Neuropathy Father        toxic peripheral   . Cancer Paternal Grandfather        young  .  Cholelithiasis Sister   . Mental retardation Sister        ADD, schizoaffective d/o  . Obesity Sister   . Arthritis Brother   . Cancer Cousin 40       ovarian    Social History   Socioeconomic History  . Marital status: Married    Spouse name: Not on file  . Number of children: Not on file  . Years of education: Not on file  . Highest education level: Not on file  Occupational History  . Not on file  Social Needs  . Financial resource strain: Not on file  . Food insecurity:    Worry: Not on file    Inability: Not on file  . Transportation needs:    Medical: Not on file    Non-medical: Not on file  Tobacco Use  . Smoking status: Never Smoker  . Smokeless tobacco: Never Used  Substance and Sexual Activity  . Alcohol use: No  . Drug use: No  . Sexual activity: Never    Birth control/protection: None  Lifestyle  . Physical activity:    Days per week: Not on file    Minutes per session: Not on file  . Stress: Not on file  Relationships  . Social connections:    Talks on phone: Not on file    Gets together: Not  on file    Attends religious service: Not on file    Active member of club or organization: Not on file    Attends meetings of clubs or organizations: Not on file    Relationship status: Not on file  . Intimate partner violence:    Fear of current or ex partner: Not on file    Emotionally abused: Not on file    Physically abused: Not on file    Forced sexual activity: Not on file  Other Topics Concern  . 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 No major dietary restrictions    Outpatient Medications Prior to Visit  Medication Sig Dispense Refill  . Cholecalciferol (VITAMIN D3) 5000 units CAPS Take 1 capsule by mouth daily.    . Estradiol-Estriol-Progesterone (BIEST/PROGESTERONE) CREA Place onto the skin 2 (two) times daily.     Marland Kitchen HYDROcodone-homatropine (HYCODAN) 5-1.5 MG/5ML syrup Take  5 mLs by mouth every 8 (eight) hours as needed for cough. 120 mL 0  . lithium 300 MG tablet Take 300 mg by mouth 2 (two) times daily.    . Probiotic Product (PROBIOTIC-10 PO) Take 1 capsule by mouth daily.    Marland Kitchen NATURE-THROID 130 MG tablet Take 1 tablet by mouth daily.  2   No facility-administered medications prior to visit.     Allergies  Allergen Reactions  . Gentamycin [Gentamicin]     Reacted to eye ointment, swelling red painful eyes    Review of Systems  Constitutional: Negative for fever and malaise/fatigue.  HENT: Negative for congestion.   Eyes: Negative for blurred vision.  Respiratory: Negative for shortness of breath.   Cardiovascular: Negative for chest pain, palpitations and leg swelling.  Gastrointestinal: Negative for abdominal pain, blood in stool and nausea.  Genitourinary: Negative for dysuria and frequency.  Musculoskeletal: Negative for falls.  Skin: Negative for rash.  Neurological: Negative for dizziness, loss of consciousness and headaches.  Endo/Heme/Allergies: Negative for environmental allergies.  Psychiatric/Behavioral: Negative for depression. The patient is not nervous/anxious.        Objective:    Physical Exam Constitutional:      Appearance: Normal appearance. She is not ill-appearing.  HENT:     Nose: Nose normal.  Pulmonary:     Effort: Pulmonary effort is normal.  Neurological:     Mental Status: She is alert and oriented to person, place, and time.  Psychiatric:        Mood and Affect: Mood normal.        Behavior: Behavior normal.     BP 118/72 (BP Location: Left Arm, Patient Position: Sitting, Cuff Size: Normal) Comment: taking at home  Pulse 68   Temp 98.4 F (36.9 C) (Oral)   Resp 18   Wt 132 lb (59.9 kg)   BMI 20.99 kg/m  Wt Readings from Last 3 Encounters:  05/09/18 132 lb (59.9 kg)  11/06/17 138 lb 6.4 oz (62.8 kg)  05/29/17 146 lb 12.8 oz (66.6 kg)    Diabetic Foot Exam - Simple   No data filed     Lab  Results  Component Value Date   WBC 7.3 11/06/2017   HGB 13.9 11/06/2017   HCT 41.3 11/06/2017   PLT 171.0 11/06/2017   GLUCOSE 97 11/06/2017   CHOL 146 11/06/2017   TRIG 109.0 11/06/2017   HDL 56.90 11/06/2017   LDLCALC 67 11/06/2017   ALT 11 11/06/2017  AST 11 11/06/2017   NA 140 11/06/2017   K 4.0 11/06/2017   CL 104 11/06/2017   CREATININE 0.99 11/06/2017   BUN 22 11/06/2017   CO2 29 11/06/2017   TSH 0.26 (L) 11/06/2017    Lab Results  Component Value Date   TSH 0.26 (L) 11/06/2017   Lab Results  Component Value Date   WBC 7.3 11/06/2017   HGB 13.9 11/06/2017   HCT 41.3 11/06/2017   MCV 90.1 11/06/2017   PLT 171.0 11/06/2017   Lab Results  Component Value Date   NA 140 11/06/2017   K 4.0 11/06/2017   CO2 29 11/06/2017   GLUCOSE 97 11/06/2017   BUN 22 11/06/2017   CREATININE 0.99 11/06/2017   BILITOT 0.5 11/06/2017   ALKPHOS 61 11/06/2017   AST 11 11/06/2017   ALT 11 11/06/2017   PROT 6.5 11/06/2017   ALBUMIN 4.2 11/06/2017   CALCIUM 9.5 11/06/2017   ANIONGAP 5 06/25/2014   GFR 62.43 11/06/2017   Lab Results  Component Value Date   CHOL 146 11/06/2017   Lab Results  Component Value Date   HDL 56.90 11/06/2017   Lab Results  Component Value Date   LDLCALC 67 11/06/2017   Lab Results  Component Value Date   TRIG 109.0 11/06/2017   Lab Results  Component Value Date   CHOLHDL 3 11/06/2017   No results found for: HGBA1C     Assessment & Plan:   Problem List Items Addressed This Visit    Asthma    No recent exacerbations but will send in a refill on Albuterol in case she has an exacerbation.       Relevant Medications   albuterol (VENTOLIN HFA) 108 (90 Base) MCG/ACT inhaler   Thyroid disease    Had her dose of Nature Throid decreased recently to 113 mg and is feeling better was over treated and had noted an increase in an anxious uneasy feeling and some palpitations but that has all improved.       Bipolar 1 disorder ([place])     Doing well on Lithium, following with psychiatry         I have discontinued Chrisha L. Silbernagel's Nature-Throid. I am also having her start on albuterol and Thyroid. Additionally, I am having her maintain her lithium, Biest/Progesterone, Probiotic Product (PROBIOTIC-10 PO), Vitamin D3, and HYDROcodone-homatropine.  Meds ordered this encounter  Medications  . albuterol (VENTOLIN HFA) 108 (90 Base) MCG/ACT inhaler    Sig: Inhale 2 puffs into the lungs every 6 (six) hours as needed for wheezing or shortness of breath.    Dispense:  1 Inhaler    Refill:  2  . Thyroid (NATURE-THROID) 113.75 MG TABS    Sig: Take 113.75 mg by mouth 1 day or 1 dose for 1 dose.     I discussed the assessment and treatment plan with the patient. The patient was provided an opportunity to ask questions and all were answered. The patient agreed with the plan and demonstrated an understanding of the instructions.   The patient was advised to call back or seek an in-person evaluation if the symptoms worsen or if the condition fails to improve as anticipated.  I provided 15 minutes of non-face-to-face time during this encounter.   Penni Homans, MD

## 2018-05-12 NOTE — Assessment & Plan Note (Signed)
No recent exacerbations but will send in a refill on Albuterol in case she has an exacerbation.

## 2018-05-12 NOTE — Assessment & Plan Note (Signed)
Doing well on Lithium, following with psychiatry

## 2018-06-03 DIAGNOSIS — Z5181 Encounter for therapeutic drug level monitoring: Secondary | ICD-10-CM | POA: Diagnosis not present

## 2018-06-04 DIAGNOSIS — F3111 Bipolar disorder, current episode manic without psychotic features, mild: Secondary | ICD-10-CM | POA: Diagnosis not present

## 2018-08-11 ENCOUNTER — Encounter: Payer: Self-pay | Admitting: Family Medicine

## 2018-09-05 DIAGNOSIS — F3111 Bipolar disorder, current episode manic without psychotic features, mild: Secondary | ICD-10-CM | POA: Diagnosis not present

## 2018-10-07 DIAGNOSIS — R5383 Other fatigue: Secondary | ICD-10-CM | POA: Diagnosis not present

## 2018-10-07 DIAGNOSIS — R7309 Other abnormal glucose: Secondary | ICD-10-CM | POA: Diagnosis not present

## 2018-10-07 DIAGNOSIS — R05 Cough: Secondary | ICD-10-CM | POA: Diagnosis not present

## 2018-10-07 DIAGNOSIS — E782 Mixed hyperlipidemia: Secondary | ICD-10-CM | POA: Diagnosis not present

## 2018-10-07 DIAGNOSIS — E039 Hypothyroidism, unspecified: Secondary | ICD-10-CM | POA: Diagnosis not present

## 2018-10-07 DIAGNOSIS — N951 Menopausal and female climacteric states: Secondary | ICD-10-CM | POA: Diagnosis not present

## 2018-10-07 DIAGNOSIS — E559 Vitamin D deficiency, unspecified: Secondary | ICD-10-CM | POA: Diagnosis not present

## 2018-10-21 DIAGNOSIS — N951 Menopausal and female climacteric states: Secondary | ICD-10-CM | POA: Diagnosis not present

## 2018-10-21 DIAGNOSIS — R5383 Other fatigue: Secondary | ICD-10-CM | POA: Diagnosis not present

## 2018-10-21 DIAGNOSIS — G4700 Insomnia, unspecified: Secondary | ICD-10-CM | POA: Diagnosis not present

## 2018-10-21 DIAGNOSIS — E039 Hypothyroidism, unspecified: Secondary | ICD-10-CM | POA: Diagnosis not present

## 2018-11-08 ENCOUNTER — Encounter: Payer: BLUE CROSS/BLUE SHIELD | Admitting: Family Medicine

## 2018-11-22 DIAGNOSIS — E039 Hypothyroidism, unspecified: Secondary | ICD-10-CM | POA: Diagnosis not present

## 2018-11-22 DIAGNOSIS — R5383 Other fatigue: Secondary | ICD-10-CM | POA: Diagnosis not present

## 2019-01-01 DIAGNOSIS — F3111 Bipolar disorder, current episode manic without psychotic features, mild: Secondary | ICD-10-CM | POA: Diagnosis not present

## 2019-01-21 ENCOUNTER — Ambulatory Visit (INDEPENDENT_AMBULATORY_CARE_PROVIDER_SITE_OTHER): Payer: BC Managed Care – PPO | Admitting: Family Medicine

## 2019-01-21 ENCOUNTER — Other Ambulatory Visit: Payer: Self-pay

## 2019-01-21 DIAGNOSIS — E079 Disorder of thyroid, unspecified: Secondary | ICD-10-CM | POA: Diagnosis not present

## 2019-01-21 DIAGNOSIS — F319 Bipolar disorder, unspecified: Secondary | ICD-10-CM | POA: Diagnosis not present

## 2019-01-21 DIAGNOSIS — H04123 Dry eye syndrome of bilateral lacrimal glands: Secondary | ICD-10-CM

## 2019-01-21 DIAGNOSIS — J45909 Unspecified asthma, uncomplicated: Secondary | ICD-10-CM

## 2019-01-27 DIAGNOSIS — H04123 Dry eye syndrome of bilateral lacrimal glands: Secondary | ICD-10-CM | POA: Insufficient documentation

## 2019-01-27 DIAGNOSIS — H16223 Keratoconjunctivitis sicca, not specified as Sjogren's, bilateral: Secondary | ICD-10-CM | POA: Diagnosis not present

## 2019-01-27 NOTE — Progress Notes (Signed)
Patient ID: Cheyenne Garner, female   DOB: [birth year], 54 y.o.   MRN: [id number] Virtual Visit via Video Note  I connected with Cheyenne Garner on 01/21/19 at  1:20 PM EST by a video enabled telemedicine application and verified that I am speaking with the correct person using two identifiers.  Location: Patient: home Provider: home   I discussed the limitations of evaluation and management by telemedicine and the availability of in person appointments. The patient expressed understanding and agreed to proceed. Cheyenne Garner, CMA was able to get her set up on a visit, video   Subjective:    Patient ID: TOTIANNA Garner, female    DOB: [birth year], 54 y.o.   MRN: [id number]  No chief complaint on file.   HPI Patient is in today for follow upon chronic medical concerns including thyroid disease, asthma and more. She is doing well. No recent febrile illness or hospitalizations. 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 Has had some trouble with her eyes dry and strained some discharge at times. No pain. No fevers or significant or acute visual changes. Denies CP/palp/SOB/HA/congestion/fevers/GI or GU c/o. Taking meds as prescribed  Past Medical History:  Diagnosis Date  . Asthma    cough variant asthma developed in last couple of years  . Bipolar 1 disorder ([place])   . Cervical cancer screening 09/12/2016  . Colon polyp 09/12/2016  . Hx of colonic polyp 09/12/2016   Colonoscopy 2017 with 1 small polyp, done by Dr [name], repeat colonoscopy in 5 years, 2022  . Preventative health care 07/23/2015  . Thyroid disease     Past Surgical History:  Procedure Laterality Date  . APPENDECTOMY    . SKIN BIOPSY     back moles removed (precancer?)    Family History  Problem Relation Age of Onset  . Arthritis Mother   . Hypertension Father   . Heart disease Father        MI, s/p triple bypass at age 71  . Neuropathy Father        toxic peripheral   . Cancer  Paternal Grandfather        young  . Cholelithiasis Sister   . Mental retardation Sister        ADD, schizoaffective d/o  . Obesity Sister   . Arthritis Brother   . Cancer Cousin 40       ovarian    Social History   Socioeconomic History  . Marital status: Married    Spouse name: Not on file  . Number of children: Not on file  . Years of education: Not on file  . Highest education level: Not on file  Occupational History  . Not on file  Tobacco Use  . Smoking status: Never Smoker  . Smokeless tobacco: Never Used  Substance and Sexual Activity  . Alcohol use: No  . Drug use: No  . Sexual activity: Never    Birth control/protection: None  Other Topics Concern  . 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 No major dietary restrictions   Social Determinants of Health   Financial Resource Strain:   . Difficulty of Paying Living Expenses: Not on file  Food Insecurity:   . Worried About Charity fundraiser in the Last Year: Not on file  . Ran Out of Food in the Last Year:  Not on file  Transportation Needs:   . Lack of Transportation (Medical): Not on file  . Lack of Transportation (Non-Medical): Not on file  Physical Activity:   . Days of Exercise per Week: Not on file  . Minutes of Exercise per Session: Not on file  Stress:   . Feeling of Stress : Not on file  Social Connections:   . Frequency of Communication with Friends and Family: Not on file  . Frequency of Social Gatherings with Friends and Family: Not on file  . Attends Religious Services: Not on file  . Active Member of Clubs or Organizations: Not on file  . Attends Archivist Meetings: Not on file  . Marital Status: Not on file  Intimate Partner Violence:   . Fear of Current or Ex-Partner: Not on file  . Emotionally Abused: Not on file  . Physically Abused: Not on file  . Sexually Abused: Not on file    Outpatient Medications  Prior to Visit  Medication Sig Dispense Refill  . albuterol (VENTOLIN HFA) 108 (90 Base) MCG/ACT inhaler Inhale 2 puffs into the lungs every 6 (six) hours as needed for wheezing or shortness of breath. 1 Inhaler 2  . Cholecalciferol (VITAMIN D3) 5000 units CAPS Take 1 capsule by mouth daily.    . Estradiol-Estriol-Progesterone (BIEST/PROGESTERONE) CREA Place onto the skin 2 (two) times daily.     Marland Kitchen lithium 300 MG tablet Take 300 mg by mouth 2 (two) times daily.    . Probiotic Product (PROBIOTIC-10 PO) Take 1 capsule by mouth daily.    Marland Kitchen thyroid (NP THYROID) 120 MG tablet Take 1 tablet (120 mg total) by mouth daily before breakfast.    . HYDROcodone-homatropine (HYCODAN) 5-1.5 MG/5ML syrup Take 5 mLs by mouth every 8 (eight) hours as needed for cough. 120 mL 0  . Thyroid (NATURE-THROID) 113.75 MG TABS Take 113.75 mg by mouth 1 day or 1 dose for 1 dose.     No facility-administered medications prior to visit.    Allergies  Allergen Reactions  . Gentamycin [Gentamicin]     Reacted to eye ointment, swelling red painful eyes    Review of Systems  Constitutional: Negative for fever and malaise/fatigue.  HENT: Negative for congestion.   Eyes: Positive for discharge. Negative for blurred vision.  Respiratory: Negative for shortness of breath.   Cardiovascular: Negative for chest pain, palpitations and leg swelling.  Gastrointestinal: Negative for abdominal pain, blood in stool and nausea.  Genitourinary: Negative for dysuria and frequency.  Musculoskeletal: Negative for falls.  Skin: Negative for rash.  Neurological: Negative for dizziness, loss of consciousness and headaches.  Endo/Heme/Allergies: Negative for environmental allergies.  Psychiatric/Behavioral: Negative for depression. The patient is not nervous/anxious.        Objective:    Physical Exam Constitutional:      Appearance: Normal appearance. She is not ill-appearing.  HENT:     Head: Normocephalic and atraumatic.      Nose: Nose normal.  Eyes:     General:        Right eye: No discharge.        Left eye: No discharge.     Conjunctiva/sclera: Conjunctivae normal.  Pulmonary:     Effort: Pulmonary effort is normal.  Neurological:     Mental Status: She is alert and oriented to person, place, and time.  Psychiatric:        Behavior: Behavior normal.     BP 120/70 (BP Location: Left Arm, Patient Position:  Sitting, Cuff Size: Normal)   Wt 130 lb (59 kg)   BMI 20.67 kg/m  Wt Readings from Last 3 Encounters:  01/21/19 130 lb (59 kg)  05/09/18 132 lb (59.9 kg)  11/06/17 138 lb 6.4 oz (62.8 kg)    Diabetic Foot Exam - Simple   No data filed     Lab Results  Component Value Date   WBC 7.3 11/06/2017   HGB 13.9 11/06/2017   HCT 41.3 11/06/2017   PLT 171.0 11/06/2017   GLUCOSE 97 11/06/2017   CHOL 146 11/06/2017   TRIG 109.0 11/06/2017   HDL 56.90 11/06/2017   LDLCALC 67 11/06/2017   ALT 11 11/06/2017   AST 11 11/06/2017   NA 140 11/06/2017   K 4.0 11/06/2017   CL 104 11/06/2017   CREATININE 0.99 11/06/2017   BUN 22 11/06/2017   CO2 29 11/06/2017   TSH 0.26 (L) 11/06/2017    Lab Results  Component Value Date   TSH 0.26 (L) 11/06/2017   Lab Results  Component Value Date   WBC 7.3 11/06/2017   HGB 13.9 11/06/2017   HCT 41.3 11/06/2017   MCV 90.1 11/06/2017   PLT 171.0 11/06/2017   Lab Results  Component Value Date   NA 140 11/06/2017   K 4.0 11/06/2017   CO2 29 11/06/2017   GLUCOSE 97 11/06/2017   BUN 22 11/06/2017   CREATININE 0.99 11/06/2017   BILITOT 0.5 11/06/2017   ALKPHOS 61 11/06/2017   AST 11 11/06/2017   ALT 11 11/06/2017   PROT 6.5 11/06/2017   ALBUMIN 4.2 11/06/2017   CALCIUM 9.5 11/06/2017   ANIONGAP 5 06/25/2014   GFR 62.43 11/06/2017   Lab Results  Component Value Date   CHOL 146 11/06/2017   Lab Results  Component Value Date   HDL 56.90 11/06/2017   Lab Results  Component Value Date   LDLCALC 67 11/06/2017   Lab Results  Component Value  Date   TRIG 109.0 11/06/2017   Lab Results  Component Value Date   CHOLHDL 3 11/06/2017   No results found for: HGBA1C     Assessment & Plan:   Problem List Items Addressed This Visit    Asthma    No recent exacerbation      Thyroid disease    Has recently had her meds changed and is doing well on NPThroid 120 mg daily, follows with Dr [facility]      Relevant Medications   thyroid (NP THYROID) 120 MG tablet   Bipolar 1 disorder ([place])    She continues to follow with psychiatry and despite the stress of the pandemic she is doing well. 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       Bilateral dry eyes    Hydrate well. Add hydrating drops to eyes frequently, blue light glasses on computer. If continues to bother her should see opthamology.          I have discontinued Cheyenne Garner's HYDROcodone-homatropine. I am also having her maintain her lithium, Biest/Progesterone, Probiotic Product (PROBIOTIC-10 PO), Vitamin D3, albuterol, and thyroid.  No orders of the defined types were placed in this encounter.    I discussed the assessment and treatment plan with the patient. The patient was provided an opportunity to ask questions and all were answered. The patient agreed with the plan and demonstrated an understanding of the instructions.  The patient was advised to call back or seek an in-person evaluation if the symptoms worsen or if the condition fails to improve as anticipated.  I provided 25 minutes of non-face-to-face time during this encounter.   Penni Homans, MD

## 2019-01-27 NOTE — Assessment & Plan Note (Signed)
She continues to follow with psychiatry and despite the stress of the pandemic she is doing well. 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

## 2019-01-27 NOTE — Assessment & Plan Note (Signed)
Has recently had her meds changed and is doing well on NPThroid 120 mg daily, follows with Dr Cassie Freer with [facility]

## 2019-01-27 NOTE — Assessment & Plan Note (Signed)
No recent exacerbation 

## 2019-01-27 NOTE — Assessment & Plan Note (Signed)
Hydrate well. Add hydrating drops to eyes frequently, blue light glasses on computer. If continues to bother her should see opthamology.

## 2019-02-03 DIAGNOSIS — H16223 Keratoconjunctivitis sicca, not specified as Sjogren's, bilateral: Secondary | ICD-10-CM | POA: Diagnosis not present

## 2019-02-18 ENCOUNTER — Encounter: Payer: Self-pay | Admitting: Family Medicine

## 2019-04-24 DIAGNOSIS — Z5181 Encounter for therapeutic drug level monitoring: Secondary | ICD-10-CM | POA: Diagnosis not present

## 2019-05-13 DIAGNOSIS — H16223 Keratoconjunctivitis sicca, not specified as Sjogren's, bilateral: Secondary | ICD-10-CM | POA: Diagnosis not present

## 2019-05-13 DIAGNOSIS — H04551 Acquired stenosis of right nasolacrimal duct: Secondary | ICD-10-CM | POA: Diagnosis not present

## 2019-07-03 DIAGNOSIS — F3111 Bipolar disorder, current episode manic without psychotic features, mild: Secondary | ICD-10-CM | POA: Diagnosis not present

## 2019-10-02 ENCOUNTER — Encounter: Payer: Self-pay | Admitting: Family Medicine

## 2019-10-02 ENCOUNTER — Telehealth: Payer: Self-pay | Admitting: Family Medicine

## 2019-10-02 ENCOUNTER — Other Ambulatory Visit: Payer: Self-pay | Admitting: Family Medicine

## 2019-10-02 DIAGNOSIS — N649 Disorder of breast, unspecified: Secondary | ICD-10-CM

## 2019-10-02 NOTE — Telephone Encounter (Signed)
Caller: Cheyenne Garner Call back # [phone]  Patient has scheduled her cpe on 03/23/20. Patient is need of a diagnostic mammo and lab work. She is wondering if it could be order before her appointment.

## 2019-10-03 ENCOUNTER — Other Ambulatory Visit: Payer: Self-pay | Admitting: Family Medicine

## 2019-10-03 DIAGNOSIS — Z0000 Encounter for general adult medical examination without abnormal findings: Secondary | ICD-10-CM

## 2019-10-03 DIAGNOSIS — E079 Disorder of thyroid, unspecified: Secondary | ICD-10-CM

## 2019-10-03 NOTE — Telephone Encounter (Signed)
Left detailed message on machine that mammogram was ordered but she will have to do labs at time of visit due to coding (she does not have any health issues like HTN, DM, etc).

## 2019-10-06 NOTE — Telephone Encounter (Signed)
Patient has been scheduled

## 2019-10-08 ENCOUNTER — Other Ambulatory Visit: Payer: Self-pay | Admitting: Family Medicine

## 2019-10-08 DIAGNOSIS — N649 Disorder of breast, unspecified: Secondary | ICD-10-CM

## 2019-10-10 ENCOUNTER — Other Ambulatory Visit: Payer: Self-pay | Admitting: *Deleted

## 2019-10-10 ENCOUNTER — Other Ambulatory Visit (INDEPENDENT_AMBULATORY_CARE_PROVIDER_SITE_OTHER): Payer: BC Managed Care – PPO

## 2019-10-10 ENCOUNTER — Other Ambulatory Visit: Payer: Self-pay

## 2019-10-10 DIAGNOSIS — Z20822 Contact with and (suspected) exposure to covid-19: Secondary | ICD-10-CM

## 2019-10-10 DIAGNOSIS — E079 Disorder of thyroid, unspecified: Secondary | ICD-10-CM

## 2019-10-10 NOTE — Progress Notes (Signed)
cocvi

## 2019-10-10 NOTE — Telephone Encounter (Signed)
Patient came into office and covid antibody testing was adding

## 2019-10-12 LAB — SARS-COV-2 ANTIBODY, IGM: SARS-CoV-2 Antibody, IgM: NEGATIVE

## 2019-10-12 LAB — TSH: TSH: 0.208 u[IU]/mL — ABNORMAL LOW (ref 0.450–4.500)

## 2019-10-14 NOTE — Progress Notes (Signed)
LMOM to return call.

## 2019-10-21 ENCOUNTER — Ambulatory Visit
Admission: RE | Admit: 2019-10-21 | Discharge: 2019-10-21 | Disposition: A | Discharge status: Home / Self Care | Payer: BC Managed Care – PPO | Source: Ambulatory Visit | Attending: Family Medicine | Admitting: Family Medicine

## 2019-10-21 ENCOUNTER — Other Ambulatory Visit: Payer: Self-pay | Admitting: Family Medicine

## 2019-10-21 ENCOUNTER — Other Ambulatory Visit: Payer: Self-pay

## 2019-10-21 DIAGNOSIS — N649 Disorder of breast, unspecified: Secondary | ICD-10-CM

## 2019-10-21 DIAGNOSIS — R922 Inconclusive mammogram: Secondary | ICD-10-CM | POA: Diagnosis not present

## 2019-10-21 DIAGNOSIS — N6452 Nipple discharge: Secondary | ICD-10-CM | POA: Diagnosis not present

## 2019-10-27 DIAGNOSIS — N6452 Nipple discharge: Secondary | ICD-10-CM | POA: Diagnosis not present

## 2019-10-29 ENCOUNTER — Other Ambulatory Visit: Payer: Self-pay | Admitting: General Surgery

## 2019-10-29 DIAGNOSIS — N6452 Nipple discharge: Secondary | ICD-10-CM

## 2019-11-11 ENCOUNTER — Ambulatory Visit
Admission: RE | Admit: 2019-11-11 | Discharge: 2019-11-11 | Disposition: A | Discharge status: Home / Self Care | Payer: BC Managed Care – PPO | Source: Ambulatory Visit | Attending: General Surgery | Admitting: General Surgery

## 2019-11-11 DIAGNOSIS — N6341 Unspecified lump in right breast, subareolar: Secondary | ICD-10-CM | POA: Diagnosis not present

## 2019-11-11 DIAGNOSIS — N6321 Unspecified lump in the left breast, upper outer quadrant: Secondary | ICD-10-CM | POA: Diagnosis not present

## 2019-11-11 DIAGNOSIS — N6452 Nipple discharge: Secondary | ICD-10-CM

## 2019-11-11 MED ORDER — GADOBUTROL 1 MMOL/ML IV SOLN
6.0000 mL | Freq: Once | INTRAVENOUS | Status: AC | PRN
  Administered 2019-11-11: 6 mL via INTRAVENOUS

## 2019-11-18 ENCOUNTER — Other Ambulatory Visit: Payer: Self-pay | Admitting: General Surgery

## 2019-11-18 DIAGNOSIS — R9389 Abnormal findings on diagnostic imaging of other specified body structures: Secondary | ICD-10-CM

## 2019-12-04 ENCOUNTER — Ambulatory Visit
Admission: RE | Admit: 2019-12-04 | Discharge: 2019-12-04 | Disposition: A | Discharge status: Home / Self Care | Payer: BC Managed Care – PPO | Source: Ambulatory Visit | Attending: General Surgery | Admitting: General Surgery

## 2019-12-04 ENCOUNTER — Other Ambulatory Visit: Payer: Self-pay | Admitting: Body Imaging

## 2019-12-04 ENCOUNTER — Other Ambulatory Visit: Payer: Self-pay

## 2019-12-04 DIAGNOSIS — R9389 Abnormal findings on diagnostic imaging of other specified body structures: Secondary | ICD-10-CM

## 2019-12-04 DIAGNOSIS — N6321 Unspecified lump in the left breast, upper outer quadrant: Secondary | ICD-10-CM | POA: Diagnosis not present

## 2019-12-04 DIAGNOSIS — R928 Other abnormal and inconclusive findings on diagnostic imaging of breast: Secondary | ICD-10-CM | POA: Diagnosis not present

## 2019-12-04 DIAGNOSIS — N6011 Diffuse cystic mastopathy of right breast: Secondary | ICD-10-CM | POA: Diagnosis not present

## 2019-12-04 MED ORDER — GADOBUTROL 1 MMOL/ML IV SOLN
4.0000 mL | Freq: Once | INTRAVENOUS | Status: AC | PRN
  Administered 2019-12-04: 4 mL via INTRAVENOUS

## 2019-12-31 DIAGNOSIS — F3111 Bipolar disorder, current episode manic without psychotic features, mild: Secondary | ICD-10-CM | POA: Diagnosis not present

## 2020-01-02 ENCOUNTER — Ambulatory Visit: Payer: Self-pay | Admitting: General Surgery

## 2020-01-02 DIAGNOSIS — N6091 Unspecified benign mammary dysplasia of right breast: Secondary | ICD-10-CM

## 2020-01-02 DIAGNOSIS — N6320 Unspecified lump in the left breast, unspecified quadrant: Secondary | ICD-10-CM

## 2020-01-20 ENCOUNTER — Other Ambulatory Visit: Payer: BC Managed Care – PPO

## 2020-01-21 DIAGNOSIS — E559 Vitamin D deficiency, unspecified: Secondary | ICD-10-CM | POA: Diagnosis not present

## 2020-01-21 DIAGNOSIS — Z131 Encounter for screening for diabetes mellitus: Secondary | ICD-10-CM | POA: Diagnosis not present

## 2020-01-21 DIAGNOSIS — R5383 Other fatigue: Secondary | ICD-10-CM | POA: Diagnosis not present

## 2020-01-21 DIAGNOSIS — E063 Autoimmune thyroiditis: Secondary | ICD-10-CM | POA: Diagnosis not present

## 2020-01-21 DIAGNOSIS — N943 Premenstrual tension syndrome: Secondary | ICD-10-CM | POA: Diagnosis not present

## 2020-01-21 DIAGNOSIS — Z13220 Encounter for screening for lipoid disorders: Secondary | ICD-10-CM | POA: Diagnosis not present

## 2020-01-22 ENCOUNTER — Other Ambulatory Visit: Payer: BC Managed Care – PPO

## 2020-01-26 ENCOUNTER — Other Ambulatory Visit: Payer: Self-pay | Admitting: General Surgery

## 2020-01-26 DIAGNOSIS — N6091 Unspecified benign mammary dysplasia of right breast: Secondary | ICD-10-CM

## 2020-01-26 DIAGNOSIS — N6320 Unspecified lump in the left breast, unspecified quadrant: Secondary | ICD-10-CM

## 2020-01-29 DIAGNOSIS — E039 Hypothyroidism, unspecified: Secondary | ICD-10-CM | POA: Diagnosis not present

## 2020-01-29 DIAGNOSIS — N63 Unspecified lump in unspecified breast: Secondary | ICD-10-CM | POA: Diagnosis not present

## 2020-01-29 DIAGNOSIS — R5383 Other fatigue: Secondary | ICD-10-CM | POA: Diagnosis not present

## 2020-01-29 DIAGNOSIS — N943 Premenstrual tension syndrome: Secondary | ICD-10-CM | POA: Diagnosis not present

## 2020-02-02 ENCOUNTER — Other Ambulatory Visit (HOSPITAL_COMMUNITY): Payer: BC Managed Care – PPO

## 2020-02-06 ENCOUNTER — Encounter (HOSPITAL_BASED_OUTPATIENT_CLINIC_OR_DEPARTMENT_OTHER): Payer: Self-pay | Admitting: General Surgery

## 2020-02-06 ENCOUNTER — Other Ambulatory Visit: Payer: Self-pay

## 2020-02-09 ENCOUNTER — Other Ambulatory Visit (HOSPITAL_COMMUNITY)
Admission: RE | Admit: 2020-02-09 | Discharge: 2020-02-09 | Disposition: A | Discharge status: Home / Self Care | Payer: BC Managed Care – PPO | Source: Ambulatory Visit | Attending: General Surgery | Admitting: General Surgery

## 2020-02-09 DIAGNOSIS — Z79890 Hormone replacement therapy (postmenopausal): Secondary | ICD-10-CM | POA: Diagnosis not present

## 2020-02-09 DIAGNOSIS — Z79899 Other long term (current) drug therapy: Secondary | ICD-10-CM | POA: Diagnosis not present

## 2020-02-09 DIAGNOSIS — Z20822 Contact with and (suspected) exposure to covid-19: Secondary | ICD-10-CM | POA: Insufficient documentation

## 2020-02-09 DIAGNOSIS — Z01812 Encounter for preprocedural laboratory examination: Secondary | ICD-10-CM | POA: Insufficient documentation

## 2020-02-09 DIAGNOSIS — C50412 Malignant neoplasm of upper-outer quadrant of left female breast: Secondary | ICD-10-CM | POA: Diagnosis not present

## 2020-02-09 DIAGNOSIS — N6091 Unspecified benign mammary dysplasia of right breast: Secondary | ICD-10-CM | POA: Diagnosis not present

## 2020-02-09 LAB — SARS CORONAVIRUS 2 (TAT 6-24 HRS): SARS Coronavirus 2: NEGATIVE

## 2020-02-11 ENCOUNTER — Ambulatory Visit
Admission: RE | Admit: 2020-02-11 | Discharge: 2020-02-11 | Disposition: A | Discharge status: Home / Self Care | Payer: BC Managed Care – PPO | Source: Ambulatory Visit | Attending: General Surgery | Admitting: General Surgery

## 2020-02-11 ENCOUNTER — Other Ambulatory Visit: Payer: Self-pay

## 2020-02-11 DIAGNOSIS — N6320 Unspecified lump in the left breast, unspecified quadrant: Secondary | ICD-10-CM

## 2020-02-11 DIAGNOSIS — N6321 Unspecified lump in the left breast, upper outer quadrant: Secondary | ICD-10-CM | POA: Diagnosis not present

## 2020-02-11 DIAGNOSIS — N6081 Other benign mammary dysplasias of right breast: Secondary | ICD-10-CM | POA: Diagnosis not present

## 2020-02-11 DIAGNOSIS — N6091 Unspecified benign mammary dysplasia of right breast: Secondary | ICD-10-CM

## 2020-02-11 MED ORDER — CHLORHEXIDINE GLUCONATE CLOTH 2 % EX PADS: 6.0000 | MEDICATED_PAD | Freq: Once | CUTANEOUS | Status: DC

## 2020-02-11 NOTE — Progress Notes (Signed)

## 2020-02-12 ENCOUNTER — Ambulatory Visit
Admission: RE | Admit: 2020-02-12 | Discharge: 2020-02-12 | Disposition: A | Discharge status: Home / Self Care | Payer: BC Managed Care – PPO | Source: Ambulatory Visit | Attending: General Surgery | Admitting: General Surgery

## 2020-02-12 ENCOUNTER — Other Ambulatory Visit: Payer: Self-pay

## 2020-02-12 ENCOUNTER — Ambulatory Visit (HOSPITAL_BASED_OUTPATIENT_CLINIC_OR_DEPARTMENT_OTHER): Payer: BC Managed Care – PPO | Admitting: Anesthesiology

## 2020-02-12 ENCOUNTER — Encounter (HOSPITAL_BASED_OUTPATIENT_CLINIC_OR_DEPARTMENT_OTHER)
Admission: RE | Disposition: A | Discharge status: Home / Self Care | Payer: Self-pay | Source: Home / Self Care | Attending: General Surgery

## 2020-02-12 ENCOUNTER — Encounter (HOSPITAL_BASED_OUTPATIENT_CLINIC_OR_DEPARTMENT_OTHER): Payer: Self-pay | Admitting: General Surgery

## 2020-02-12 ENCOUNTER — Ambulatory Visit (HOSPITAL_BASED_OUTPATIENT_CLINIC_OR_DEPARTMENT_OTHER)
Admission: RE | Admit: 2020-02-12 | Discharge: 2020-02-12 | Disposition: A | Discharge status: Home / Self Care | Payer: BC Managed Care – PPO | Attending: General Surgery | Admitting: General Surgery

## 2020-02-12 DIAGNOSIS — R920 Mammographic microcalcification found on diagnostic imaging of breast: Secondary | ICD-10-CM | POA: Diagnosis not present

## 2020-02-12 DIAGNOSIS — N6081 Other benign mammary dysplasias of right breast: Secondary | ICD-10-CM | POA: Diagnosis not present

## 2020-02-12 DIAGNOSIS — C50919 Malignant neoplasm of unspecified site of unspecified female breast: Secondary | ICD-10-CM

## 2020-02-12 DIAGNOSIS — Z20822 Contact with and (suspected) exposure to covid-19: Secondary | ICD-10-CM | POA: Diagnosis not present

## 2020-02-12 DIAGNOSIS — N6489 Other specified disorders of breast: Secondary | ICD-10-CM | POA: Diagnosis not present

## 2020-02-12 DIAGNOSIS — C50412 Malignant neoplasm of upper-outer quadrant of left female breast: Secondary | ICD-10-CM | POA: Insufficient documentation

## 2020-02-12 DIAGNOSIS — Z79899 Other long term (current) drug therapy: Secondary | ICD-10-CM | POA: Diagnosis not present

## 2020-02-12 DIAGNOSIS — Z79890 Hormone replacement therapy (postmenopausal): Secondary | ICD-10-CM | POA: Insufficient documentation

## 2020-02-12 DIAGNOSIS — J45909 Unspecified asthma, uncomplicated: Secondary | ICD-10-CM | POA: Diagnosis not present

## 2020-02-12 DIAGNOSIS — N6091 Unspecified benign mammary dysplasia of right breast: Secondary | ICD-10-CM | POA: Diagnosis not present

## 2020-02-12 DIAGNOSIS — R928 Other abnormal and inconclusive findings on diagnostic imaging of breast: Secondary | ICD-10-CM | POA: Diagnosis not present

## 2020-02-12 DIAGNOSIS — C50912 Malignant neoplasm of unspecified site of left female breast: Secondary | ICD-10-CM | POA: Diagnosis not present

## 2020-02-12 DIAGNOSIS — N6320 Unspecified lump in the left breast, unspecified quadrant: Secondary | ICD-10-CM

## 2020-02-12 DIAGNOSIS — D241 Benign neoplasm of right breast: Secondary | ICD-10-CM | POA: Diagnosis not present

## 2020-02-12 DIAGNOSIS — N6011 Diffuse cystic mastopathy of right breast: Secondary | ICD-10-CM | POA: Diagnosis not present

## 2020-02-12 DIAGNOSIS — E039 Hypothyroidism, unspecified: Secondary | ICD-10-CM | POA: Diagnosis not present

## 2020-02-12 HISTORY — DX: Hypothyroidism, unspecified: E03.9

## 2020-02-12 HISTORY — PX: BREAST LUMPECTOMY WITH RADIOACTIVE SEED LOCALIZATION: SHX6424

## 2020-02-12 HISTORY — DX: Anxiety disorder, unspecified: F41.9

## 2020-02-12 HISTORY — DX: Allergy, unspecified, initial encounter: T78.40XA

## 2020-02-12 HISTORY — DX: Malignant neoplasm of unspecified site of unspecified female breast: C50.919

## 2020-02-12 SURGERY — BREAST LUMPECTOMY WITH RADIOACTIVE SEED LOCALIZATION
Anesthesia: General | Site: Breast | Laterality: Bilateral

## 2020-02-12 MED ORDER — LIDOCAINE HCL 1 % IJ SOLN
INTRAMUSCULAR | Status: DC | PRN
  Administered 2020-02-12: 50 mg via INTRADERMAL

## 2020-02-12 MED ORDER — CELECOXIB 200 MG PO CAPS
ORAL_CAPSULE | ORAL | Status: AC
  Filled 2020-02-12: qty 1

## 2020-02-12 MED ORDER — OXYCODONE HCL 5 MG PO TABS: 5.0000 mg | ORAL_TABLET | Freq: Four times a day (QID) | ORAL | 0 refills | Status: DC | PRN

## 2020-02-12 MED ORDER — OXYCODONE HCL 5 MG PO TABS
5.0000 mg | ORAL_TABLET | Freq: Once | ORAL | Status: DC | PRN
Start: 2020-02-12 — End: 2020-02-12

## 2020-02-12 MED ORDER — CEFAZOLIN SODIUM-DEXTROSE 2-4 GM/100ML-% IV SOLN
INTRAVENOUS | Status: AC
  Filled 2020-02-12: qty 100

## 2020-02-12 MED ORDER — GABAPENTIN 300 MG PO CAPS
ORAL_CAPSULE | ORAL | Status: AC
  Filled 2020-02-12: qty 1

## 2020-02-12 MED ORDER — SCOPOLAMINE 1 MG/3DAYS TD PT72
MEDICATED_PATCH | TRANSDERMAL | Status: DC | PRN
  Administered 2020-02-12: 1 via TRANSDERMAL

## 2020-02-12 MED ORDER — ACETAMINOPHEN 500 MG PO TABS
ORAL_TABLET | ORAL | Status: AC
  Filled 2020-02-12: qty 2

## 2020-02-12 MED ORDER — 0.9 % SODIUM CHLORIDE (POUR BTL) OPTIME
TOPICAL | Status: DC | PRN
  Administered 2020-02-12: 1000 mL

## 2020-02-12 MED ORDER — MIDAZOLAM HCL 5 MG/5ML IJ SOLN
INTRAMUSCULAR | Status: DC | PRN
  Administered 2020-02-12: 2 mg via INTRAVENOUS

## 2020-02-12 MED ORDER — MIDAZOLAM HCL 2 MG/2ML IJ SOLN
INTRAMUSCULAR | Status: AC
  Filled 2020-02-12: qty 2

## 2020-02-12 MED ORDER — FENTANYL CITRATE (PF) 100 MCG/2ML IJ SOLN
INTRAMUSCULAR | Status: AC
  Filled 2020-02-12: qty 2

## 2020-02-12 MED ORDER — OXYCODONE HCL 5 MG/5ML PO SOLN: 5.0000 mg | Freq: Once | ORAL | Status: DC | PRN

## 2020-02-12 MED ORDER — ACETAMINOPHEN 500 MG PO TABS
1000.0000 mg | ORAL_TABLET | ORAL | Status: AC
  Administered 2020-02-12: 1000 mg via ORAL

## 2020-02-12 MED ORDER — GABAPENTIN 300 MG PO CAPS
300.0000 mg | ORAL_CAPSULE | ORAL | Status: AC
  Administered 2020-02-12: 300 mg via ORAL

## 2020-02-12 MED ORDER — PROMETHAZINE HCL 25 MG/ML IJ SOLN
6.2500 mg | INTRAMUSCULAR | Status: DC | PRN
Start: 2020-02-12 — End: 2020-02-12

## 2020-02-12 MED ORDER — MEPERIDINE HCL 25 MG/ML IJ SOLN: 6.2500 mg | INTRAMUSCULAR | Status: DC | PRN

## 2020-02-12 MED ORDER — PROPOFOL 10 MG/ML IV BOLUS
INTRAVENOUS | Status: AC
  Filled 2020-02-12: qty 20

## 2020-02-12 MED ORDER — EPHEDRINE SULFATE 50 MG/ML IJ SOLN
INTRAMUSCULAR | Status: DC | PRN
  Administered 2020-02-12: 10 mg via INTRAVENOUS

## 2020-02-12 MED ORDER — AMISULPRIDE (ANTIEMETIC) 5 MG/2ML IV SOLN: 10.0000 mg | Freq: Once | INTRAVENOUS | Status: DC | PRN

## 2020-02-12 MED ORDER — LACTATED RINGERS IV SOLN: INTRAVENOUS | Status: DC

## 2020-02-12 MED ORDER — BUPIVACAINE-EPINEPHRINE (PF) 0.25% -1:200000 IJ SOLN
INTRAMUSCULAR | Status: DC | PRN
  Administered 2020-02-12: 30 mL via PERINEURAL

## 2020-02-12 MED ORDER — FENTANYL CITRATE (PF) 100 MCG/2ML IJ SOLN
INTRAMUSCULAR | Status: DC | PRN
  Administered 2020-02-12: 50 ug via INTRAVENOUS

## 2020-02-12 MED ORDER — ONDANSETRON HCL 4 MG/2ML IJ SOLN
INTRAMUSCULAR | Status: DC | PRN
  Administered 2020-02-12: 4 mg via INTRAVENOUS

## 2020-02-12 MED ORDER — LIDOCAINE 2% (20 MG/ML) 5 ML SYRINGE
INTRAMUSCULAR | Status: AC
  Filled 2020-02-12: qty 5

## 2020-02-12 MED ORDER — PROPOFOL 10 MG/ML IV BOLUS
INTRAVENOUS | Status: DC | PRN
  Administered 2020-02-12: 200 mg via INTRAVENOUS

## 2020-02-12 MED ORDER — CEFAZOLIN SODIUM-DEXTROSE 2-4 GM/100ML-% IV SOLN
2.0000 g | INTRAVENOUS | Status: AC
  Administered 2020-02-12: 2 g via INTRAVENOUS

## 2020-02-12 MED ORDER — ONDANSETRON HCL 4 MG/2ML IJ SOLN
INTRAMUSCULAR | Status: AC
  Filled 2020-02-12: qty 2

## 2020-02-12 MED ORDER — EPHEDRINE 5 MG/ML INJ
INTRAVENOUS | Status: AC
  Filled 2020-02-12: qty 10

## 2020-02-12 MED ORDER — CELECOXIB 200 MG PO CAPS
200.0000 mg | ORAL_CAPSULE | ORAL | Status: AC
  Administered 2020-02-12: 200 mg via ORAL

## 2020-02-12 MED ORDER — HYDROMORPHONE HCL 1 MG/ML IJ SOLN: 0.2500 mg | INTRAMUSCULAR | Status: DC | PRN

## 2020-02-12 SURGICAL SUPPLY — 43 items
ADH SKN CLS APL DERMABOND .7 (GAUZE/BANDAGES/DRESSINGS) ×1
APL PRP STRL LF DISP 70% ISPRP (MISCELLANEOUS) ×1
APPLIER CLIP 9.375 MED OPEN (MISCELLANEOUS)
APR CLP MED 9.3 20 MLT OPN (MISCELLANEOUS)
BLADE SURG 15 STRL LF DISP TIS (BLADE) ×1 IMPLANT
BLADE SURG 15 STRL SS (BLADE) ×2
CANISTER SUC SOCK COL 7IN (MISCELLANEOUS) ×2 IMPLANT
CANISTER SUCT 1200ML W/VALVE (MISCELLANEOUS) ×2 IMPLANT
CHLORAPREP W/TINT 26 (MISCELLANEOUS) ×2 IMPLANT
CLIP APPLIE 9.375 MED OPEN (MISCELLANEOUS) IMPLANT
COVER BACK TABLE 60X90IN (DRAPES) ×2 IMPLANT
COVER MAYO STAND STRL (DRAPES) ×2 IMPLANT
COVER PROBE W GEL 5X96 (DRAPES) ×2 IMPLANT
COVER WAND RF STERILE (DRAPES) IMPLANT
DECANTER SPIKE VIAL GLASS SM (MISCELLANEOUS) IMPLANT
DERMABOND ADVANCED (GAUZE/BANDAGES/DRESSINGS) ×1
DERMABOND ADVANCED .7 DNX12 (GAUZE/BANDAGES/DRESSINGS) ×1 IMPLANT
DRAPE LAPAROSCOPIC ABDOMINAL (DRAPES) ×2 IMPLANT
DRAPE UTILITY XL STRL (DRAPES) ×2 IMPLANT
ELECT COATED BLADE 2.86 ST (ELECTRODE) ×2 IMPLANT
ELECT REM PT RETURN 9FT ADLT (ELECTROSURGICAL) ×2
ELECTRODE REM PT RTRN 9FT ADLT (ELECTROSURGICAL) ×1 IMPLANT
GLOVE BIO SURGEON STRL SZ7.5 (GLOVE) ×4 IMPLANT
GOWN STRL REUS W/ TWL LRG LVL3 (GOWN DISPOSABLE) ×2 IMPLANT
GOWN STRL REUS W/TWL LRG LVL3 (GOWN DISPOSABLE) ×4
ILLUMINATOR WAVEGUIDE N/F (MISCELLANEOUS) IMPLANT
KIT MARKER MARGIN INK (KITS) ×2 IMPLANT
LIGHT WAVEGUIDE WIDE FLAT (MISCELLANEOUS) IMPLANT
NDL HYPO 25X1 1.5 SAFETY (NEEDLE) IMPLANT
NEEDLE HYPO 25X1 1.5 SAFETY (NEEDLE) IMPLANT
NS IRRIG 1000ML POUR BTL (IV SOLUTION) IMPLANT
PACK BASIN DAY SURGERY FS (CUSTOM PROCEDURE TRAY) ×2 IMPLANT
PENCIL SMOKE EVACUATOR (MISCELLANEOUS) ×2 IMPLANT
SLEEVE SCD COMPRESS KNEE MED (MISCELLANEOUS) ×2 IMPLANT
SPONGE LAP 18X18 RF (DISPOSABLE) ×2 IMPLANT
SUT MON AB 4-0 PC3 18 (SUTURE) ×2 IMPLANT
SUT SILK 2 0 SH (SUTURE) IMPLANT
SUT VICRYL 3-0 CR8 SH (SUTURE) ×2 IMPLANT
SYR CONTROL 10ML LL (SYRINGE) IMPLANT
TOWEL GREEN STERILE FF (TOWEL DISPOSABLE) ×2 IMPLANT
TRAY FAXITRON CT DISP (TRAY / TRAY PROCEDURE) ×2 IMPLANT
TUBE CONNECTING 20X1/4 (TUBING) ×2 IMPLANT
YANKAUER SUCT BULB TIP NO VENT (SUCTIONS) IMPLANT

## 2020-02-12 NOTE — Anesthesia Preprocedure Evaluation (Addendum)
Anesthesia Evaluation  Patient identified by MRN, date of birth, ID band Patient awake    Reviewed: Allergy & Precautions, H&P , NPO status , Patient's Chart, lab work & pertinent test results  Airway Mallampati: II  TM Distance: >3 FB Neck ROM: Full    Dental no notable dental hx.    Pulmonary asthma ,    Pulmonary exam normal breath sounds clear to auscultation       Cardiovascular negative cardio ROS Normal cardiovascular exam Rhythm:Regular Rate:Normal     Neuro/Psych Anxiety Bipolar Disorder negative neurological ROS  negative psych ROS   GI/Hepatic negative GI ROS, Neg liver ROS,   Endo/Other  Hypothyroidism   Renal/GU negative Renal ROS  negative genitourinary   Musculoskeletal negative musculoskeletal ROS (+)   Abdominal   Peds negative pediatric ROS (+)  Hematology negative hematology ROS (+)   Anesthesia Other Findings   Reproductive/Obstetrics negative OB ROS                             Anesthesia Physical Anesthesia Plan  ASA: II  Anesthesia Plan: General   Post-op Pain Management:    Induction: Intravenous  PONV Risk Score and Plan: 3 and Ondansetron, Dexamethasone, Midazolam and Treatment may vary due to age or medical condition  Airway Management Planned: LMA  Additional Equipment:   Intra-op Plan:   Post-operative Plan: Extubation in OR  Informed Consent: I have reviewed the patients History and Physical, chart, labs and discussed the procedure including the risks, benefits and alternatives for the proposed anesthesia with the patient or authorized representative who has indicated his/her understanding and acceptance.     Dental advisory given  Plan Discussed with: CRNA  Anesthesia Plan Comments:         Anesthesia Quick Evaluation

## 2020-02-12 NOTE — Op Note (Signed)
02/12/2020  9:57 AM  PATIENT:  Cheyenne Garner  55 y.o. female  PRE-OPERATIVE DIAGNOSIS:  RIGHT BREAST ATYPICAL LOBULAR HYPERPLASIA, LEFT BREAST DISCORDANCE  POST-OPERATIVE DIAGNOSIS:  RIGHT BREAST ATYPICAL LOBULAR HYPERPLASIA, LEFT BREAST DISCORDANCE  PROCEDURE:  Procedure(s): BILATERAL BREAST LUMPECTOMY WITH RADIOACTIVE SEED LOCALIZATION (Bilateral)  SURGEON:  Surgeon(s) and Role:    * Jovita Kussmaul, MD - Primary  PHYSICIAN ASSISTANT:   ASSISTANTS: none   ANESTHESIA:   local and general  EBL:  5 mL   BLOOD ADMINISTERED:none  DRAINS: none   LOCAL MEDICATIONS USED:  MARCAINE     SPECIMEN:  Source of Specimen:  right and left breast tissue  DISPOSITION OF SPECIMEN:  PATHOLOGY  COUNTS:  YES  TOURNIQUET:  * No tourniquets in log *  DICTATION: .Dragon Dictation   After informed consent was obtained the patient was brought to the operating room and placed in the supine position on the operating table.  After adequate induction of general anesthesia the patient's bilateral chest, breast, and axillary areas were prepped with ChloraPrep, allowed to dry, and draped in usual sterile manner.  An appropriate timeout was performed.  Previously an I-125 seed was placed in each breast to mark an area of atypical lobular hyperplasia in the right breast and an area of discordance in the left breast.  The neoprobe was set to I-125.  The attention was first turned to the left breast.  The area of radioactivity was readily identified very superficially in the upper outer quadrant of the left breast.  Because of the superficial nature I elected to make a small elliptical incision in the skin overlying the area of radioactivity with a 15 blade knife.  The incision was carried through the skin and subcutaneous tissue sharply with the electrocautery.  Dissection was then carried around the radioactive seed while checking the area of radioactivity frequently.  Once the specimen was removed it was  oriented with the appropriate paint colors.  A specimen radiograph was obtained that showed the clip and seed to be within the specimen.  The specimen was then sent to pathology for further evaluation.  Hemostasis was achieved using the Bovie electrocautery.  The wound was irrigated with saline and infiltrated with more quarter percent Marcaine.  The deep layer of the wound was then closed with layers of interrupted 3-0 Vicryl stitches.  The skin was closed with a running 4-0 Monocryl subcuticular stitch.  Attention was then turned to the right breast.  The area of radioactivity was readily identified in the lower outer quadrant centrally of the right breast.  The area around this was infiltrated with quarter percent Marcaine.  A curvilinear incision was made along the lower outer edge of the areola of the right breast with a 15 blade knife.  The incision was carried through the skin and subcutaneous tissue sharply with the electrocautery.  Dissection was then carried towards the radioactive seed under the direction of the neoprobe.  Once I more closely approached the radioactive seed I then removed a circular portion of breast tissue sharply with the electrocautery around the radioactive seed while checking the area of radioactivity frequently.  Once the specimen was removed it was oriented with the appropriate paint colors.  A specimen radiograph was obtained that showed the clip and seed to be near the center of the specimen.  The specimen was then sent to pathology for further evaluation.  Hemostasis was achieved using the Bovie electrocautery.  The wound was irrigated with saline  and infiltrated with more quarter percent Marcaine.  The deep layer of the wound was then closed with layers of interrupted 3-0 Vicryl stitches.  The skin was then closed with interrupted 4-0 Monocryl subcuticular stitches.  Dermabond dressings were applied.  The patient tolerated the procedure well.  At the end of the case all needle  sponge and instrument counts were correct.  The patient was then awakened and taken to recovery in stable condition.  PLAN OF CARE: Discharge to home after PACU  PATIENT DISPOSITION:  PACU - hemodynamically stable.   Delay start of Pharmacological VTE agent (>24hrs) due to surgical blood loss or risk of bleeding: not applicable

## 2020-02-12 NOTE — H&P (Signed)
Cheyenne Garner  Location: [place] Office Patient #: [phone] DOB: Nov 25, 1965 Married / Language: English / Race: White Female   History of Present Illness  The patient is a 55 year old female who presents for a follow-up for Breast mass. the patient is a 55 year old white female who we have been following for nipple discharge. As part of her workup she underwent an MRI study which showed 2 areas of enhancement one in each breast. Both of these areas were biopsied. The left side came back benign but was felt to be discordant by the radiologist. The right side came back with a small area of atypical lobular hyperplasia. She did have some bruising after the biopsies but denies any significant breast pain. The discharge from the right nipple has stopped. The presence of atypical lobular hyperplasia does carry an increased risk of breast cancer during her lifetime of around 25%.   Problem List/Past Medical  DISCHARGE FROM RIGHT NIPPLE (N64.52)  NIPPLE LESION (N64.9)  BREAST MASS, RIGHT (N63.10)  LEFT BREAST MASS (N63.20)   Past Surgical History  Appendectomy   Diagnostic Studies History  Colonoscopy  1-5 years ago Mammogram  within last year Pap Smear  1-5 years ago  Allergies No Known Allergies   Medication History  Vitamin B-12 (Oral) Specific strength unknown - Active. Zinc (Oral) Specific strength unknown - Active. Restasis (0.05% Emulsion, Ophthalmic) Active. NP Thyroid (120MG  Tablet, Oral) Active. Lithium Carbonate (300MG  Capsule, Oral) Active. Medications Reconciled  Social History  Alcohol use  Occasional alcohol use. Caffeine use  Coffee. No drug use  Tobacco use  Never smoker.  Family History  Depression  Sister. Heart Disease  Father. Hypertension  Brother, Father.  Pregnancy / Birth History  Age at menarche  65 years. Age of menopause  75-50 Gravida  2 Length (months) of breastfeeding  12-24 Maternal age  76-35 Para   2  Other Problems  Asthma  Depression     Review of Systems  General Not Present- Appetite Loss, Chills, Fatigue, Fever, Night Sweats, Weight Gain and Weight Loss. Skin Not Present- Change in Wart/Mole, Dryness, Hives, Jaundice, New Lesions, Non-Healing Wounds, Rash and Ulcer. HEENT Present- Seasonal Allergies. Not Present- Earache, Hearing Loss, Hoarseness, Nose Bleed, Oral Ulcers, Ringing in the Ears, Sinus Pain, Sore Throat, Visual Disturbances, Wears glasses/contact lenses and Yellow Eyes. Respiratory Not Present- Bloody sputum, Chronic Cough, Difficulty Breathing, Snoring and Wheezing. Breast Present- Breast Mass. Not Present- Breast Pain, Nipple Discharge and Skin Changes. Cardiovascular Not Present- Chest Pain, Difficulty Breathing Lying Down, Leg Cramps, Palpitations, Rapid Heart Rate, Shortness of Breath and Swelling of Extremities. Gastrointestinal Not Present- Abdominal Pain, Bloating, Bloody Stool, Change in Bowel Habits, Chronic diarrhea, Constipation, Difficulty Swallowing, Excessive gas, Gets full quickly at meals, Hemorrhoids, Indigestion, Nausea, Rectal Pain and Vomiting. Female Genitourinary Not Present- Frequency, Nocturia, Painful Urination, Pelvic Pain and Urgency. Musculoskeletal Present- Joint Pain. Not Present- Back Pain, Joint Stiffness, Muscle Pain, Muscle Weakness and Swelling of Extremities. Neurological Not Present- Decreased Memory, Fainting, Headaches, Numbness, Seizures, Tingling, Tremor, Trouble walking and Weakness. Psychiatric Present- Bipolar. Not Present- Anxiety, Change in Sleep Pattern, Depression, Fearful and Frequent crying. Endocrine Not Present- Cold Intolerance, Excessive Hunger, Hair Changes, Heat Intolerance, Hot flashes and New Diabetes. Hematology Not Present- Blood Thinners, Easy Bruising, Excessive bleeding, Gland problems, HIV and Persistent Infections.  Vitals  Weight: 143.38 lb Height: 66.5in Body Surface Area: 1.75 m Body Mass  Index: 22.79 kg/m        Physical Exam  General Mental  Status-Alert. General Appearance-Consistent with stated age. Hydration-Well hydrated. Voice-Normal.  Head and Neck Head-normocephalic, atraumatic with no lesions or palpable masses. Trachea-midline. Thyroid Gland Characteristics - normal size and consistency.  Eye Eyeball - Bilateral-Extraocular movements intact. Sclera/Conjunctiva - Bilateral-No scleral icterus.  Chest and Lung Exam Chest and lung exam reveals -quiet, even and easy respiratory effort with no use of accessory muscles and on auscultation, normal breath sounds, no adventitious sounds and normal vocal resonance. Inspection Chest Wall - Normal. Back - normal.  Breast Note: There is no palpable mass in either breast. There is no palpable axillary, supraclavicular, or cervical lymphadenopathy.    Cardiovascular Cardiovascular examination reveals -normal heart sounds, regular rate and rhythm with no murmurs and normal pedal pulses bilaterally.  Abdomen Inspection Inspection of the abdomen reveals - No Hernias. Skin - Scar - no surgical scars. Palpation/Percussion Palpation and Percussion of the abdomen reveal - Soft, Non Tender, No Rebound tenderness, No Rigidity (guarding) and No hepatosplenomegaly. Auscultation Auscultation of the abdomen reveals - Bowel sounds normal.  Neurologic Neurologic evaluation reveals -alert and oriented x 3 with no impairment of recent or remote memory. Mental Status-Normal.  Musculoskeletal Normal Exam - Left-Upper Extremity Strength Normal and Lower Extremity Strength Normal. Normal Exam - Right-Upper Extremity Strength Normal and Lower Extremity Strength Normal.  Lymphatic Head & Neck  General Head & Neck Lymphatics: Bilateral - Description - Normal. Axillary  General Axillary Region: Bilateral - Description - Normal. Tenderness - Non Tender. Femoral & Inguinal  Generalized  Femoral & Inguinal Lymphatics: Bilateral - Description - Normal. Tenderness - Non Tender.    Assessment & Plan  ATYPICAL LOBULAR HYPERPLASIA OF RIGHT BREAST (N60.91) Impression: the patient appears to have a small area of atypical lobular hyperplasia in the right breast as well as an area of the discordance in the left breast. Because of the abnormal appearance of both areas and because there is a 5-10% chance of missing something more significant the recommendation is to have these 2 areas were removed. I have discussed with her in detail the risks and benefits of the operation as well as some of the technical aspects including the use of a radioactive seed for localization and she understands and wishes to proceed. The presence of atypical lobular hyperplasia also gives her an elevated risk of breast cancer during her lifetime at around 25%. For this reason I will also refer her to the high risk breast clinic at the cancer center to talk about risk reduction. This patient encounter took 30 minutes today to perform the following: take history, perform exam, review outside records, interpret imaging, counsel the patient on their diagnosis and document encounter, findings & plan in the EHR Current Plans Referred to Oncology, for evaluation and follow up (Oncology). Routine.

## 2020-02-12 NOTE — Transfer of Care (Signed)
Immediate Anesthesia Transfer of Care Note  Patient: Cheyenne Garner  Procedure(s) Performed: BILATERAL BREAST LUMPECTOMY WITH RADIOACTIVE SEED LOCALIZATION (Bilateral Breast)  Patient Location: PACU  Anesthesia Type:General  Level of Consciousness: drowsy and patient cooperative  Airway & Oxygen Therapy: Patient Spontanous Breathing and Patient connected to face mask oxygen  Post-op Assessment: Report given to RN and Post -op Vital signs reviewed and stable  Post vital signs: Reviewed and stable  Last Vitals:  Vitals Value Taken Time  BP    Temp    Pulse 65 02/12/20 1003  Resp 12 02/12/20 1003  SpO2 100 % 02/12/20 1003  Vitals shown include unvalidated device data.  Last Pain:  Vitals:   02/12/20 0713  TempSrc: Oral  PainSc: 0-No pain      Patients Stated Pain Goal: 4 (57/97/28 2060)  Complications: No complications documented.

## 2020-02-12 NOTE — Discharge Instructions (Signed)
NO TYLENOL OR IBUPROFEN UNTIL AFTER 1:30PM TODAY, IF NEEDED  Post Anesthesia Home Care Instructions  Activity: Get plenty of rest for the remainder of the day. A responsible individual must stay with you for 24 hours following the procedure.  For the next 24 hours, DO NOT: -Drive a car -Paediatric nurse -Drink alcoholic beverages -Take any medication unless instructed by your physician -Make any legal decisions or sign important papers.  Meals: Start with liquid foods such as gelatin or soup. Progress to regular foods as tolerated. Avoid greasy, spicy, heavy foods. If nausea and/or vomiting occur, drink only clear liquids until the nausea and/or vomiting subsides. Call your physician if vomiting continues.  Special Instructions/Symptoms: Your throat may feel dry or sore from the anesthesia or the breathing tube placed in your throat during surgery. If this causes discomfort, gargle with warm salt water. The discomfort should disappear within 24 hours.  If you had a scopolamine patch placed behind your ear for the management of post- operative nausea and/or vomiting:  1. The medication in the patch is effective for 72 hours, after which it should be removed.  Wrap patch in a tissue and discard in the trash. Wash hands thoroughly with soap and water. 2. You may remove the patch earlier than 72 hours if you experience unpleasant side effects which may include dry mouth, dizziness or visual disturbances. 3. Avoid touching the patch. Wash your hands with soap and water after contact with the patch.

## 2020-02-12 NOTE — Anesthesia Postprocedure Evaluation (Signed)
Anesthesia Post Note  Patient: Cheyenne Garner  Procedure(s) Performed: BILATERAL BREAST LUMPECTOMY WITH RADIOACTIVE SEED LOCALIZATION (Bilateral Breast)     Patient location during evaluation: PACU Anesthesia Type: General Level of consciousness: awake and alert Pain management: pain level controlled Vital Signs Assessment: post-procedure vital signs reviewed and stable Respiratory status: spontaneous breathing, nonlabored ventilation and respiratory function stable Cardiovascular status: blood pressure returned to baseline and stable Postop Assessment: no apparent nausea or vomiting Anesthetic complications: no   No complications documented.  Last Vitals:  Vitals:   02/12/20 1037 02/12/20 1121  BP: 127/81   Pulse:  (!) 58  Resp: 18 20  Temp: 36.5 C 36.5 C  SpO2: 100%     Last Pain:  Vitals:   02/12/20 1037  TempSrc:   PainSc: 0-No pain                 Lynda Rainwater

## 2020-02-12 NOTE — Anesthesia Procedure Notes (Signed)
Procedure Name: LMA Insertion Date/Time: 02/12/2020 8:43 AM Performed by: Glory Buff, CRNA Patient Re-evaluated:Patient Re-evaluated prior to induction Oxygen Delivery Method: Circle system utilized Induction Type: IV induction LMA: LMA inserted LMA Size: 4.0 Grade View: Grade I Number of attempts: 1

## 2020-02-12 NOTE — Interval H&P Note (Signed)
History and Physical Interval Note:  02/12/2020 8:10 AM  Cheyenne Garner  has presented today for surgery, with the diagnosis of RIGHT BREAST ATYPICAL LOBULAR HYPERPLASIA, LEFT BREAST DISCORDANCE.  The various methods of treatment have been discussed with the patient and family. After consideration of risks, benefits and other options for treatment, the patient has consented to  Procedure(s): BILATERAL BREAST LUMPECTOMY WITH RADIOACTIVE SEED LOCALIZATION (Bilateral) as a surgical intervention.  The patient's history has been reviewed, patient examined, no change in status, stable for surgery.  I have reviewed the patient's chart and labs.  Questions were answered to the patient's satisfaction.     [name]

## 2020-02-13 ENCOUNTER — Encounter (HOSPITAL_BASED_OUTPATIENT_CLINIC_OR_DEPARTMENT_OTHER): Payer: Self-pay | Admitting: General Surgery

## 2020-02-17 ENCOUNTER — Ambulatory Visit: Payer: Self-pay | Admitting: General Surgery

## 2020-02-17 DIAGNOSIS — C50412 Malignant neoplasm of upper-outer quadrant of left female breast: Secondary | ICD-10-CM

## 2020-02-24 LAB — SURGICAL PATHOLOGY

## 2020-02-25 ENCOUNTER — Encounter (HOSPITAL_BASED_OUTPATIENT_CLINIC_OR_DEPARTMENT_OTHER): Payer: Self-pay | Admitting: General Surgery

## 2020-02-25 ENCOUNTER — Other Ambulatory Visit: Payer: Self-pay

## 2020-03-01 ENCOUNTER — Other Ambulatory Visit (HOSPITAL_COMMUNITY)
Admission: RE | Admit: 2020-03-01 | Discharge: 2020-03-01 | Disposition: A | Discharge status: Home / Self Care | Payer: BC Managed Care – PPO | Source: Ambulatory Visit | Attending: General Surgery | Admitting: General Surgery

## 2020-03-01 DIAGNOSIS — Z79890 Hormone replacement therapy (postmenopausal): Secondary | ICD-10-CM | POA: Diagnosis not present

## 2020-03-01 DIAGNOSIS — Z20822 Contact with and (suspected) exposure to covid-19: Secondary | ICD-10-CM | POA: Insufficient documentation

## 2020-03-01 DIAGNOSIS — C50412 Malignant neoplasm of upper-outer quadrant of left female breast: Secondary | ICD-10-CM | POA: Diagnosis not present

## 2020-03-01 DIAGNOSIS — Z01812 Encounter for preprocedural laboratory examination: Secondary | ICD-10-CM | POA: Insufficient documentation

## 2020-03-01 DIAGNOSIS — Z79899 Other long term (current) drug therapy: Secondary | ICD-10-CM | POA: Diagnosis not present

## 2020-03-01 DIAGNOSIS — Z170 Estrogen receptor positive status [ER+]: Secondary | ICD-10-CM | POA: Diagnosis not present

## 2020-03-01 LAB — SARS CORONAVIRUS 2 (TAT 6-24 HRS): SARS Coronavirus 2: NEGATIVE

## 2020-03-01 NOTE — Progress Notes (Signed)

## 2020-03-02 ENCOUNTER — Encounter: Payer: Self-pay | Admitting: Physical Therapy

## 2020-03-02 ENCOUNTER — Other Ambulatory Visit: Payer: Self-pay

## 2020-03-02 ENCOUNTER — Ambulatory Visit: Payer: BC Managed Care – PPO | Attending: General Surgery | Admitting: Physical Therapy

## 2020-03-02 DIAGNOSIS — Z170 Estrogen receptor positive status [ER+]: Secondary | ICD-10-CM | POA: Insufficient documentation

## 2020-03-02 DIAGNOSIS — R293 Abnormal posture: Secondary | ICD-10-CM | POA: Diagnosis not present

## 2020-03-02 DIAGNOSIS — C50412 Malignant neoplasm of upper-outer quadrant of left female breast: Secondary | ICD-10-CM | POA: Insufficient documentation

## 2020-03-02 NOTE — Therapy (Signed)
[place], [place], [address] Phone: [phone]   Fax:  [phone]  Physical Therapy Evaluation  Patient Details  Name: Cheyenne Garner MRN: [id number] Date of Birth: 02-23-1965 Referring Provider (PT): Joaquin Courts Date: 03/02/2020   PT End of Session - 03/02/20 1220    Visit Number 1    Number of Visits 2    Date for PT Re-Evaluation 03/30/20    PT Start Time 0907    PT Stop Time 1004    PT Time Calculation (min) 57 min    Activity Tolerance Patient tolerated treatment well    Behavior During Therapy [facility] for tasks assessed/performed           Past Medical History:  Diagnosis Date  . Allergies   . Anxiety   . Asthma    cough variant asthma developed in last couple of years  . Bipolar 1 disorder ([place])   . Cervical cancer screening 09/12/2016  . Colon polyp 09/12/2016  . Hx of colonic polyp 09/12/2016   Colonoscopy 2017 with 1 small polyp, done by Dr [name], repeat colonoscopy in 5 years, 2022  . Hypothyroidism   . Preventative health care 07/23/2015  . Thyroid disease     Past Surgical History:  Procedure Laterality Date  . APPENDECTOMY    . BREAST LUMPECTOMY WITH RADIOACTIVE SEED LOCALIZATION Bilateral 02/12/2020   Procedure: BILATERAL BREAST LUMPECTOMY WITH RADIOACTIVE SEED LOCALIZATION;  Surgeon: Jovita Kussmaul, MD;  Location: [place];  Service: General;  Laterality: Bilateral;  . SKIN BIOPSY     back moles removed (precancer?)    There were no vitals filed for this visit.    Subjective Assessment - 03/02/20 0911    Subjective The found I had two different types of cancer in the left breast. I have to go back in for a reexcision and SLNB (2 nodes).    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 No/denies    Pain Score 0-No pain              [facility] PT Assessment - 03/02/20 0001      Assessment   Medical Diagnosis left breast cancer    Referring Provider (PT) Marlou Starks    Onset Date/Surgical Date 02/12/20    Hand Dominance Left    Prior Therapy none      Precautions   Precautions Other (comment)    Precaution Comments active cancer      Restrictions   Weight Bearing Restrictions No      Balance Screen   Has the patient fallen in the past 6 months No    Has the patient had a decrease in activity level because of a fear of falling?  No    Is the patient reluctant to leave their home because of a fear of falling?  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    Leisure pt exercised 3 days a week prior to surgery, strength training, kick boxing      Cognition   Overall Cognitive Status Within Functional Limits for tasks assessed      Posture/Postural Control   Posture/Postural Control Postural limitations    Postural Limitations Forward head      ROM / Strength   AROM / PROM / Strength AROM      AROM   AROM Assessment Site Shoulder    Right/Left Shoulder Right;Left    Right Shoulder Flexion 173 Degrees    Right Shoulder ABduction 180 Degrees    Right Shoulder Internal Rotation 63 Degrees    Right Shoulder External Rotation 90 Degrees    Left Shoulder Flexion 165 Degrees    Left Shoulder ABduction 180 Degrees    Left Shoulder Internal Rotation 71 Degrees    Left Shoulder External Rotation 85 Degrees             LYMPHEDEMA/ONCOLOGY QUESTIONNAIRE - 03/02/20  0001      Type   Cancer Type left breast cancer      Surgeries   Lumpectomy Date 02/12/20    Sentinel Lymph Node Biopsy Date 03/04/20      Lymphedema Assessments   Lymphedema Assessments Upper extremities      Right Upper Extremity Lymphedema   15 cm Proximal to Olecranon Process 29 cm    Olecranon Process 24.1 cm    15 cm Proximal to Ulnar Styloid Process 22.5 cm    Just Proximal to Ulnar Styloid Process 15.5 cm    Across Hand at [employer] 18.1 cm    At [place] of 2nd Digit 6 cm      Left Upper Extremity Lymphedema   15 cm Proximal to Olecranon Process 28.5 cm    Olecranon Process 25 cm    15 cm Proximal to Ulnar Styloid Process 23 cm    Just Proximal to Ulnar Styloid Process 14.6 cm    Across Hand at [employer] 18.6 cm    At [place] of 2nd Digit 7 cm           L-DEX FLOWSHEETS - 03/02/20 0900      L-DEX LYMPHEDEMA SCREENING   Measurement Type Unilateral    L-DEX MEASUREMENT EXTREMITY Upper Extremity    POSITION  Standing    DOMINANT SIDE Left    At Risk Side Left    BASELINE SCORE (UNILATERAL) -1.7           The patient was assessed using the L-Dex machine today to produce a lymphedema index baseline score. The patient will be reassessed on a regular basis (typically every 3 months) to obtain new L-Dex scores. If the score is > 6.5 points away from his/her baseline score indicating onset of subclinical lymphedema, it will be recommended to wear a compression garment for 4 weeks, 12 hours per day and then be reassessed. If the score continues to be > 6.5 points from baseline at reassessment, we will initiate lymphedema treatment. Assessing in this manner has a 95% rate of preventing clinically significant lymphedema.        Objective measurements completed on examination: See above findings.               PT Education - 03/02/20 1222    Education Details ABC class, post op shoulder ROM exercises, anatomy and  physiology of lymphatic system,  signs and symptoms of lymphedema, SOZO, lymphedema risk reduction practices    Person(s) Educated Patient    Methods Explanation;Handout    Comprehension Verbalized understanding               PT Long Term Goals - 03/02/20 1227      PT LONG TERM GOAL #1   Title Pt will return to baseline shoulder ROM measurements and not demonstrate any signs or symptoms of lymphedema.    Time 4    Period Weeks    Status New    Target Date 03/30/20                  Plan - 03/02/20 1220    Clinical Impression Statement Pt presents to PT after recently undergoing bilateral breast lumpectomies on 02/12/20. She was found to have L breast cancer and will require a reexcision and SLNB on the L. Educated pt about signs and symptoms of lymphedema and anatomy and physiology of lymphatic system. Instructed pt in post op stretching exercises and ABC class. Currently she has full bilateral shoulder ROM. Will reassess pt 3 weeks after surgery and compare to baselines.    Stability/Clinical Decision Making Stable/Uncomplicated    Clinical Decision Making Low    Rehab Potential Good    PT Frequency --   eval and 1 f/u visit   PT Duration 4 weeks    PT Treatment/Interventions ADLs/Self Care Home Management;Patient/family education;Therapeutic exercise    PT Next Visit Plan reassess baselines    PT Home Exercise Plan post op shoulder ROM    Consulted and Agree with Plan of Care Patient           Patient will benefit from skilled therapeutic intervention in order to improve the following deficits and impairments:  Decreased knowledge of precautions,Postural dysfunction  Visit Diagnosis: Abnormal posture  Malignant neoplasm of upper-outer quadrant of left breast in female, estrogen receptor positive ([place])   Patient will follow up at outpatient cancer rehab 3-4 weeks following surgery.  If the patient requires physical therapy at that time, a specific plan will be dictated and sent to the referring  physician for approval. The patient was educated today on appropriate basic range of motion exercises to begin post operatively and the importance of attending the After Breast Cancer class following surgery.  Patient was educated today on lymphedema risk reduction practices as it pertains to recommendations that will benefit the patient immediately following surgery.  She verbalized good understanding.     Problem List Patient Active Problem List   Diagnosis Date Noted  . Bilateral dry eyes 01/27/2019  . Lesion of breast 06/03/2017  . Cervical cancer screening 09/12/2016  . Hx of colonic polyp 09/12/2016  . Preventative health care 07/23/2015  . Asthma   . Thyroid disease   . Bipolar 1 disorder [facility])     Allyson Sabal [facility] 03/02/2020, 12:28 PM  [place], [place], [address] Phone: [phone]   Fax:  [phone]  Name: Cheyenne Garner MRN: [id number] Date of Birth: [birth year]  Manus Gunning, PT 03/02/20 12:28 PM

## 2020-03-04 ENCOUNTER — Ambulatory Visit (HOSPITAL_COMMUNITY)
Admission: RE | Admit: 2020-03-04 | Discharge: 2020-03-04 | Disposition: A | Discharge status: Home / Self Care | Payer: BC Managed Care – PPO | Source: Ambulatory Visit | Attending: General Surgery | Admitting: General Surgery

## 2020-03-04 ENCOUNTER — Ambulatory Visit (HOSPITAL_BASED_OUTPATIENT_CLINIC_OR_DEPARTMENT_OTHER)
Admission: RE | Admit: 2020-03-04 | Discharge: 2020-03-04 | Disposition: A | Discharge status: Home / Self Care | Payer: BC Managed Care – PPO | Attending: General Surgery | Admitting: General Surgery

## 2020-03-04 ENCOUNTER — Other Ambulatory Visit: Payer: Self-pay

## 2020-03-04 ENCOUNTER — Ambulatory Visit (HOSPITAL_BASED_OUTPATIENT_CLINIC_OR_DEPARTMENT_OTHER): Payer: BC Managed Care – PPO | Admitting: Anesthesiology

## 2020-03-04 ENCOUNTER — Encounter (HOSPITAL_BASED_OUTPATIENT_CLINIC_OR_DEPARTMENT_OTHER): Payer: Self-pay | Admitting: General Surgery

## 2020-03-04 ENCOUNTER — Encounter (HOSPITAL_BASED_OUTPATIENT_CLINIC_OR_DEPARTMENT_OTHER)
Admission: RE | Disposition: A | Discharge status: Home / Self Care | Payer: Self-pay | Source: Home / Self Care | Attending: General Surgery

## 2020-03-04 DIAGNOSIS — Z79899 Other long term (current) drug therapy: Secondary | ICD-10-CM | POA: Diagnosis not present

## 2020-03-04 DIAGNOSIS — Z20822 Contact with and (suspected) exposure to covid-19: Secondary | ICD-10-CM | POA: Diagnosis not present

## 2020-03-04 DIAGNOSIS — N61 Mastitis without abscess: Secondary | ICD-10-CM | POA: Diagnosis not present

## 2020-03-04 DIAGNOSIS — J45909 Unspecified asthma, uncomplicated: Secondary | ICD-10-CM | POA: Diagnosis not present

## 2020-03-04 DIAGNOSIS — F319 Bipolar disorder, unspecified: Secondary | ICD-10-CM | POA: Diagnosis not present

## 2020-03-04 DIAGNOSIS — Z79890 Hormone replacement therapy (postmenopausal): Secondary | ICD-10-CM | POA: Diagnosis not present

## 2020-03-04 DIAGNOSIS — G8918 Other acute postprocedural pain: Secondary | ICD-10-CM | POA: Diagnosis not present

## 2020-03-04 DIAGNOSIS — Z170 Estrogen receptor positive status [ER+]: Secondary | ICD-10-CM | POA: Diagnosis not present

## 2020-03-04 DIAGNOSIS — C50412 Malignant neoplasm of upper-outer quadrant of left female breast: Secondary | ICD-10-CM | POA: Diagnosis not present

## 2020-03-04 DIAGNOSIS — C50912 Malignant neoplasm of unspecified site of left female breast: Secondary | ICD-10-CM | POA: Diagnosis not present

## 2020-03-04 HISTORY — PX: RE-EXCISION OF BREAST LUMPECTOMY: SHX6048

## 2020-03-04 HISTORY — PX: SENTINEL NODE BIOPSY: SHX6608

## 2020-03-04 SURGERY — EXCISION, LESION, BREAST
Anesthesia: Regional | Site: Breast | Laterality: Left

## 2020-03-04 MED ORDER — MIDAZOLAM HCL 2 MG/2ML IJ SOLN
INTRAMUSCULAR | Status: AC
  Filled 2020-03-04: qty 2

## 2020-03-04 MED ORDER — ONDANSETRON 4 MG PO TBDP
4.0000 mg | ORAL_TABLET | Freq: Once | ORAL | Status: AC
  Administered 2020-03-04: 4 mg via ORAL

## 2020-03-04 MED ORDER — OXYCODONE HCL 5 MG/5ML PO SOLN
5.0000 mg | Freq: Once | ORAL | Status: DC | PRN
Start: 2020-03-04 — End: 2020-03-04

## 2020-03-04 MED ORDER — ACETAMINOPHEN 500 MG PO TABS
ORAL_TABLET | ORAL | Status: AC
  Filled 2020-03-04: qty 2

## 2020-03-04 MED ORDER — CHLORHEXIDINE GLUCONATE CLOTH 2 % EX PADS: 6.0000 | MEDICATED_PAD | Freq: Once | CUTANEOUS | Status: DC

## 2020-03-04 MED ORDER — MIDAZOLAM HCL 2 MG/2ML IJ SOLN
2.0000 mg | Freq: Once | INTRAMUSCULAR | Status: AC
  Administered 2020-03-04: 2 mg via INTRAVENOUS

## 2020-03-04 MED ORDER — ACETAMINOPHEN 500 MG PO TABS: 1000.0000 mg | ORAL_TABLET | ORAL | Status: DC

## 2020-03-04 MED ORDER — GABAPENTIN 300 MG PO CAPS
ORAL_CAPSULE | ORAL | Status: AC
  Filled 2020-03-04: qty 1

## 2020-03-04 MED ORDER — FENTANYL CITRATE (PF) 100 MCG/2ML IJ SOLN
100.0000 ug | Freq: Once | INTRAMUSCULAR | Status: AC
  Administered 2020-03-04 (×2): 50 ug via INTRAVENOUS

## 2020-03-04 MED ORDER — FENTANYL CITRATE (PF) 100 MCG/2ML IJ SOLN
INTRAMUSCULAR | Status: AC
  Filled 2020-03-04: qty 2

## 2020-03-04 MED ORDER — AMISULPRIDE (ANTIEMETIC) 5 MG/2ML IV SOLN: 10.0000 mg | Freq: Once | INTRAVENOUS | Status: DC | PRN

## 2020-03-04 MED ORDER — EPHEDRINE SULFATE-NACL 50-0.9 MG/10ML-% IV SOSY
PREFILLED_SYRINGE | INTRAVENOUS | Status: DC | PRN
  Administered 2020-03-04 (×3): 10 mg via INTRAVENOUS

## 2020-03-04 MED ORDER — ACETAMINOPHEN 500 MG PO TABS
1000.0000 mg | ORAL_TABLET | Freq: Once | ORAL | Status: AC
  Administered 2020-03-04: 1000 mg via ORAL

## 2020-03-04 MED ORDER — PROPOFOL 10 MG/ML IV BOLUS
INTRAVENOUS | Status: DC | PRN
  Administered 2020-03-04: 30 mg via INTRAVENOUS
  Administered 2020-03-04: 170 mg via INTRAVENOUS

## 2020-03-04 MED ORDER — CELECOXIB 200 MG PO CAPS
ORAL_CAPSULE | ORAL | Status: AC
  Filled 2020-03-04: qty 1

## 2020-03-04 MED ORDER — GABAPENTIN 300 MG PO CAPS
300.0000 mg | ORAL_CAPSULE | ORAL | Status: AC
  Administered 2020-03-04: 300 mg via ORAL

## 2020-03-04 MED ORDER — LACTATED RINGERS IV SOLN: INTRAVENOUS | Status: DC

## 2020-03-04 MED ORDER — SCOPOLAMINE 1 MG/3DAYS TD PT72
MEDICATED_PATCH | TRANSDERMAL | Status: AC
  Filled 2020-03-04: qty 1

## 2020-03-04 MED ORDER — FENTANYL CITRATE (PF) 100 MCG/2ML IJ SOLN: 25.0000 ug | INTRAMUSCULAR | Status: DC | PRN

## 2020-03-04 MED ORDER — BUPIVACAINE HCL (PF) 0.5 % IJ SOLN
INTRAMUSCULAR | Status: DC | PRN
  Administered 2020-03-04: 20 mL

## 2020-03-04 MED ORDER — BUPIVACAINE LIPOSOME 1.3 % IJ SUSP
INTRAMUSCULAR | Status: DC | PRN
  Administered 2020-03-04: 10 mL

## 2020-03-04 MED ORDER — PROPOFOL 10 MG/ML IV BOLUS
INTRAVENOUS | Status: AC
  Filled 2020-03-04: qty 40

## 2020-03-04 MED ORDER — LIDOCAINE 2% (20 MG/ML) 5 ML SYRINGE
INTRAMUSCULAR | Status: DC | PRN
  Administered 2020-03-04: 80 mg via INTRAVENOUS

## 2020-03-04 MED ORDER — PROMETHAZINE HCL 25 MG/ML IJ SOLN: 6.2500 mg | INTRAMUSCULAR | Status: DC | PRN

## 2020-03-04 MED ORDER — CELECOXIB 200 MG PO CAPS
200.0000 mg | ORAL_CAPSULE | ORAL | Status: AC
  Administered 2020-03-04: 200 mg via ORAL

## 2020-03-04 MED ORDER — METHYLENE BLUE 0.5 % INJ SOLN
INTRAVENOUS | Status: AC
  Filled 2020-03-04: qty 10

## 2020-03-04 MED ORDER — TECHNETIUM TC 99M TILMANOCEPT KIT
1.0000 | PACK | Freq: Once | INTRAVENOUS | Status: AC | PRN
  Administered 2020-03-04: 1 via INTRADERMAL

## 2020-03-04 MED ORDER — SCOPOLAMINE 1 MG/3DAYS TD PT72
1.0000 | MEDICATED_PATCH | TRANSDERMAL | Status: DC
  Administered 2020-03-04: 1.5 mg via TRANSDERMAL

## 2020-03-04 MED ORDER — FENTANYL CITRATE (PF) 250 MCG/5ML IJ SOLN
INTRAMUSCULAR | Status: DC | PRN
  Administered 2020-03-04: 25 ug via INTRAVENOUS

## 2020-03-04 MED ORDER — CEFAZOLIN SODIUM-DEXTROSE 2-4 GM/100ML-% IV SOLN
INTRAVENOUS | Status: AC
  Filled 2020-03-04: qty 100

## 2020-03-04 MED ORDER — ONDANSETRON HCL 4 MG/2ML IJ SOLN
INTRAMUSCULAR | Status: DC | PRN
  Administered 2020-03-04: 4 mg via INTRAVENOUS

## 2020-03-04 MED ORDER — BUPIVACAINE HCL 0.25 % IJ SOLN
INTRAMUSCULAR | Status: DC | PRN
  Administered 2020-03-04: 17 mL

## 2020-03-04 MED ORDER — OXYCODONE HCL 5 MG PO TABS
5.0000 mg | ORAL_TABLET | Freq: Once | ORAL | Status: DC | PRN
Start: 2020-03-04 — End: 2020-03-04

## 2020-03-04 MED ORDER — CEFAZOLIN SODIUM-DEXTROSE 2-4 GM/100ML-% IV SOLN
2.0000 g | INTRAVENOUS | Status: AC
  Administered 2020-03-04: 2 g via INTRAVENOUS

## 2020-03-04 MED ORDER — ONDANSETRON 4 MG PO TBDP
ORAL_TABLET | ORAL | Status: AC
  Filled 2020-03-04: qty 1

## 2020-03-04 MED ORDER — DEXAMETHASONE SODIUM PHOSPHATE 10 MG/ML IJ SOLN
INTRAMUSCULAR | Status: DC | PRN
  Administered 2020-03-04: 5 mg via INTRAVENOUS

## 2020-03-04 SURGICAL SUPPLY — 48 items
ADH SKN CLS APL DERMABOND .7 (GAUZE/BANDAGES/DRESSINGS) ×2
APL PRP STRL LF DISP 70% ISPRP (MISCELLANEOUS) ×1
APPLIER CLIP 11 MED OPEN (CLIP) ×2
APR CLP MED 11 20 MLT OPN (CLIP) ×1
BLADE SURG 15 STRL LF DISP TIS (BLADE) ×1 IMPLANT
BLADE SURG 15 STRL SS (BLADE) ×2
CANISTER SUCT 1200ML W/VALVE (MISCELLANEOUS) ×2 IMPLANT
CHLORAPREP W/TINT 26 (MISCELLANEOUS) ×2 IMPLANT
CLIP APPLIE 11 MED OPEN (CLIP) ×1 IMPLANT
CLIP VESOCCLUDE SM WIDE 6/CT (CLIP) IMPLANT
COVER BACK TABLE 60X90IN (DRAPES) ×2 IMPLANT
COVER MAYO STAND STRL (DRAPES) ×2 IMPLANT
COVER PROBE W GEL 5X96 (DRAPES) ×2 IMPLANT
COVER WAND RF STERILE (DRAPES) IMPLANT
DECANTER SPIKE VIAL GLASS SM (MISCELLANEOUS) ×2 IMPLANT
DERMABOND ADVANCED (GAUZE/BANDAGES/DRESSINGS) ×2
DERMABOND ADVANCED .7 DNX12 (GAUZE/BANDAGES/DRESSINGS) ×2 IMPLANT
DRAPE LAPAROSCOPIC ABDOMINAL (DRAPES) ×2 IMPLANT
DRAPE UTILITY XL STRL (DRAPES) ×2 IMPLANT
ELECT COATED BLADE 2.86 ST (ELECTRODE) ×2 IMPLANT
ELECT REM PT RETURN 9FT ADLT (ELECTROSURGICAL) ×2
ELECTRODE REM PT RTRN 9FT ADLT (ELECTROSURGICAL) ×1 IMPLANT
GLOVE SURG ENC MOIS LTX SZ7.5 (GLOVE) ×2 IMPLANT
GOWN STRL REUS W/ TWL LRG LVL3 (GOWN DISPOSABLE) ×2 IMPLANT
GOWN STRL REUS W/TWL LRG LVL3 (GOWN DISPOSABLE) ×4
ILLUMINATOR WAVEGUIDE N/F (MISCELLANEOUS) IMPLANT
KIT MARKER MARGIN INK (KITS) ×1 IMPLANT
LIGHT WAVEGUIDE WIDE FLAT (MISCELLANEOUS) IMPLANT
NDL HYPO 25X1 1.5 SAFETY (NEEDLE) ×1 IMPLANT
NDL SAFETY ECLIPSE 18X1.5 (NEEDLE) ×1 IMPLANT
NEEDLE HYPO 18GX1.5 SHARP (NEEDLE) ×2
NEEDLE HYPO 25X1 1.5 SAFETY (NEEDLE) ×2 IMPLANT
NS IRRIG 1000ML POUR BTL (IV SOLUTION) ×2 IMPLANT
PACK BASIN DAY SURGERY FS (CUSTOM PROCEDURE TRAY) ×2 IMPLANT
PENCIL SMOKE EVACUATOR (MISCELLANEOUS) ×2 IMPLANT
SLEEVE SCD COMPRESS KNEE MED (MISCELLANEOUS) ×2 IMPLANT
SPONGE LAP 18X18 RF (DISPOSABLE) ×2 IMPLANT
STAPLER VISISTAT 35W (STAPLE) IMPLANT
SUT ETHILON 3 0 FSL (SUTURE) IMPLANT
SUT MON AB 4-0 PC3 18 (SUTURE) ×2 IMPLANT
SUT SILK 2 0 SH (SUTURE) IMPLANT
SUT SILK 3 0 PS 1 (SUTURE) IMPLANT
SUT VICRYL 3-0 CR8 SH (SUTURE) ×2 IMPLANT
SYR CONTROL 10ML LL (SYRINGE) ×4 IMPLANT
TOWEL GREEN STERILE FF (TOWEL DISPOSABLE) ×2 IMPLANT
TRAY FAXITRON CT DISP (TRAY / TRAY PROCEDURE) IMPLANT
TUBE CONNECTING 20X1/4 (TUBING) ×2 IMPLANT
YANKAUER SUCT BULB TIP NO VENT (SUCTIONS) ×2 IMPLANT

## 2020-03-04 NOTE — Anesthesia Procedure Notes (Signed)
Anesthesia Regional Block: Pectoralis block   Pre-Anesthetic Checklist: ,, timeout performed, Correct Patient, Correct Site, Correct Laterality, Correct Procedure, Correct Position, site marked, Risks and benefits discussed,  Surgical consent,  Pre-op evaluation,  At surgeon's request and post-op pain management  Laterality: Left  Prep: chloraprep       Needles:  Injection technique: Single-shot  Needle Type: Echogenic Stimulator Needle     Needle Length: 10cm      Additional Needles:   Procedures:,,,, ultrasound used (permanent image in chart),,,,  Narrative:  Start time: 03/04/2020 11:05 AM End time: 03/04/2020 11:10 AM Injection made incrementally with aspirations every 5 mL.  Performed by: Personally  Anesthesiologist: Merlinda Frederick, MD  Additional Notes: A functioning IV was confirmed and monitors were applied.  Sterile prep and drape, hand hygiene and sterile gloves were used.  Negative aspiration and test dose prior to incremental administration of local anesthetic. The patient tolerated the procedure well.Ultrasound  guidance: relevant anatomy identified, needle position confirmed, local anesthetic spread visualized around nerve(s), vascular puncture avoided.  Image printed for medical record.

## 2020-03-04 NOTE — Transfer of Care (Signed)
Immediate Anesthesia Transfer of Care Note  Patient: BEYONCA WISZ  Procedure(s) Performed: LEFT BREAST MARGIN REEXCISION (Left Breast) SENTINEL NODE BIOPSY (Left Breast)  Patient Location: PACU  Anesthesia Type:GA combined with regional for post-op pain  Level of Consciousness: drowsy  Airway & Oxygen Therapy: Patient Spontanous Breathing and Patient connected to nasal cannula oxygen  Post-op Assessment: Report given to RN and Post -op Vital signs reviewed and stable  Post vital signs: Reviewed and stable  Last Vitals:  Vitals Value Taken Time  BP 123/72 03/04/20 1325  Temp    Pulse 66 03/04/20 1327  Resp 10 03/04/20 1327  SpO2 100 % 03/04/20 1327  Vitals shown include unvalidated device data.  Last Pain:  Vitals:   03/04/20 1056  TempSrc: Oral  PainSc: 0-No pain      Patients Stated Pain Goal: 3 (09/40/76 8088)  Complications: No complications documented.

## 2020-03-04 NOTE — Anesthesia Preprocedure Evaluation (Addendum)
Anesthesia Evaluation  Patient identified by MRN, date of birth, ID band Patient awake    Reviewed: Allergy & Precautions, H&P , NPO status , Patient's Chart, lab work & pertinent test results  Airway Mallampati: I  TM Distance: >3 FB Neck ROM: Full    Dental no notable dental hx.    Pulmonary asthma ,    Pulmonary exam normal breath sounds clear to auscultation       Cardiovascular negative cardio ROS Normal cardiovascular exam Rhythm:Regular Rate:Normal     Neuro/Psych PSYCHIATRIC DISORDERS Anxiety Bipolar Disorder negative neurological ROS     GI/Hepatic negative GI ROS, Neg liver ROS,   Endo/Other  Hypothyroidism   Renal/GU negative Renal ROS  negative genitourinary   Musculoskeletal negative musculoskeletal ROS (+)   Abdominal Normal abdominal exam  (+)   Peds negative pediatric ROS (+)  Hematology negative hematology ROS (+)   Anesthesia Other Findings   Reproductive/Obstetrics negative OB ROS                            Anesthesia Physical  Anesthesia Plan  ASA: II  Anesthesia Plan: General and Regional   Post-op Pain Management: GA combined w/ Regional for post-op pain   Induction: Intravenous  PONV Risk Score and Plan: 3 and Ondansetron, Dexamethasone, Midazolam and Treatment may vary due to age or medical condition  Airway Management Planned: LMA  Additional Equipment: None  Intra-op Plan:   Post-operative Plan: Extubation in OR  Informed Consent: I have reviewed the patients History and Physical, chart, labs and discussed the procedure including the risks, benefits and alternatives for the proposed anesthesia with the patient or authorized representative who has indicated his/her understanding and acceptance.     Dental advisory given  Plan Discussed with: CRNA, Anesthesiologist and Surgeon  Anesthesia Plan Comments: (PEC block for postop pain control.  GA/LmA. )        Anesthesia Quick Evaluation

## 2020-03-04 NOTE — Progress Notes (Signed)
Nuc med injections completed. Patient tolerated well.   

## 2020-03-04 NOTE — H&P (Signed)
Cheyenne Garner  Location: [facility] Patient #: [id number] DOB: April 14, 1965 Married / Language: English / Race: White Female   History of Present Illness The patient is a 55 year old female who presents for a follow-up for Breast cancer. The patient is a 55 year old white female who initially had an area of atypical hyperplasia in the right breast and an area of discordance in the left breast. 2 weeks ago both of these areas were removed. The right side remained benign. The left side showed a 5 mm area of invasive ductal cancer that was ER and PR positive and HER-2 negative with a Ki-67 of 2%. There was a positive margin. She tolerated the surgery well. She is scheduled for reexcision and sentinel node biopsy next week   Allergies  No Known Allergies    Medication History Vitamin B-12 (Oral) Specific strength unknown - Active. Zinc (Oral) Specific strength unknown - Active. Restasis (0.05% Emulsion, Ophthalmic) Active. NP Thyroid (120MG  Tablet, Oral) Active. Lithium Carbonate (300MG  Capsule, Oral) Active. Medications Reconciled    Review of Systems  General Not Present- Appetite Loss, Chills, Fatigue, Fever, Night Sweats, Weight Gain and Weight Loss. Skin Not Present- Change in Wart/Mole, Dryness, Hives, Jaundice, New Lesions, Non-Healing Wounds, Rash and Ulcer. HEENT Present- Seasonal Allergies. Not Present- Earache, Hearing Loss, Hoarseness, Nose Bleed, Oral Ulcers, Ringing in the Ears, Sinus Pain, Sore Throat, Visual Disturbances, Wears glasses/contact lenses and Yellow Eyes. Respiratory Not Present- Bloody sputum, Chronic Cough, Difficulty Breathing, Snoring and Wheezing. Breast Present- Breast Mass. Not Present- Breast Pain, Nipple Discharge and Skin Changes. Cardiovascular Not Present- Chest Pain, Difficulty Breathing Lying Down, Leg Cramps, Palpitations, Rapid Heart Rate, Shortness of Breath and Swelling of Extremities. Gastrointestinal Not Present-  Abdominal Pain, Bloating, Bloody Stool, Change in Bowel Habits, Chronic diarrhea, Constipation, Difficulty Swallowing, Excessive gas, Gets full quickly at meals, Hemorrhoids, Indigestion, Nausea, Rectal Pain and Vomiting. Female Genitourinary Not Present- Frequency, Nocturia, Painful Urination, Pelvic Pain and Urgency. Musculoskeletal Present- Joint Pain. Not Present- Back Pain, Joint Stiffness, Muscle Pain, Muscle Weakness and Swelling of Extremities. Neurological Not Present- Decreased Memory, Fainting, Headaches, Numbness, Seizures, Tingling, Tremor, Trouble walking and Weakness. Psychiatric Present- Bipolar. Not Present- Anxiety, Change in Sleep Pattern, Depression, Fearful and Frequent crying. Endocrine Not Present- Cold Intolerance, Excessive Hunger, Hair Changes, Heat Intolerance, Hot flashes and New Diabetes. Hematology Not Present- Blood Thinners, Easy Bruising, Excessive bleeding, Gland problems, HIV and Persistent Infections.   Physical Exam  General Mental Status-Alert. General Appearance-Consistent with stated age. Hydration-Well hydrated. Voice-Normal.  Head and Neck Head-normocephalic, atraumatic with no lesions or palpable masses. Trachea-midline. Thyroid Gland Characteristics - normal size and consistency.  Eye Eyeball - Bilateral-Extraocular movements intact. Sclera/Conjunctiva - Bilateral-No scleral icterus.  Chest and Lung Exam Chest and lung exam reveals -quiet, even and easy respiratory effort with no use of accessory muscles and on auscultation, normal breath sounds, no adventitious sounds and normal vocal resonance. Inspection Chest Wall - Normal. Back - normal.  Breast Note: The right breast periareolar and left breast upper outer quadrant incisions are healing nicely with no sign of infection or seroma   Cardiovascular Cardiovascular examination reveals -normal heart sounds, regular rate and rhythm with no murmurs and normal pedal  pulses bilaterally.  Abdomen Inspection Inspection of the abdomen reveals - No Hernias. Skin - Scar - no surgical scars. Palpation/Percussion Palpation and Percussion of the abdomen reveal - Soft, Non Tender, No Rebound tenderness, No Rigidity (guarding) and No hepatosplenomegaly. Auscultation Auscultation of the abdomen reveals -  Bowel sounds normal.  Neurologic Neurologic evaluation reveals -alert and oriented x 3 with no impairment of recent or remote memory. Mental Status-Normal.  Musculoskeletal Normal Exam - Left-Upper Extremity Strength Normal and Lower Extremity Strength Normal. Normal Exam - Right-Upper Extremity Strength Normal and Lower Extremity Strength Normal.  Lymphatic Head & Neck  General Head & Neck Lymphatics: Bilateral - Description - Normal. Axillary  General Axillary Region: Bilateral - Description - Normal. Tenderness - Non Tender. Femoral & Inguinal  Generalized Femoral & Inguinal Lymphatics: Bilateral - Description - Normal. Tenderness - Non Tender.    Assessment & Plan  MALIGNANT NEOPLASM OF UPPER-OUTER QUADRANT OF LEFT BREAST IN FEMALE, ESTROGEN RECEPTOR POSITIVE (C50.412) Impression: The patient has a small cancer in the upper outer quadrant of the left breast after recent lumpectomy. At this point she will need to go back to the OR for reexcision of the margins and sentinel node biopsy. I have discussed current detail the risks and benefits of the operation as well as some of the technical aspects and she understands and wishes to proceed. I will also refer her to medical and radiation oncology to discuss adjuvant therapy. I will also send her to physical therapy for preoperative lymphedema testing. She is scheduled for surgery next Thursday. Current Plans Referred to Physical Therapy, for evaluation and follow up (Physical Therapy). Routine. Referred to Oncology, for evaluation and follow up (Oncology). Routine.

## 2020-03-04 NOTE — Op Note (Signed)
03/04/2020  1:15 PM  PATIENT:  Cheyenne Garner  55 y.o. female  PRE-OPERATIVE DIAGNOSIS:  LEFT BREAST CANCER  POST-OPERATIVE DIAGNOSIS:  LEFT BREAST CANCER  PROCEDURE:  Procedure(s): LEFT BREAST MARGIN REEXCISION WITH DEEP LEFT AXILLARY SENTINEL NODE BIOPSY (Left)  SURGEON:  Surgeon(s) and Role:    * Jovita Kussmaul, MD - Primary  PHYSICIAN ASSISTANT:   ASSISTANTS: none   ANESTHESIA:   local and general  EBL:  minimal   BLOOD ADMINISTERED:none  DRAINS: none   LOCAL MEDICATIONS USED:  MARCAINE     SPECIMEN:  Source of Specimen:  left breast tissue with sentinel node x 2  DISPOSITION OF SPECIMEN:  PATHOLOGY  COUNTS:  YES  TOURNIQUET:  * No tourniquets in log *  DICTATION: .Dragon Dictation   After informed consent was obtained the patient was brought to the operating room and placed in the supine position on the operating table.  After adequate induction of general anesthesia the patient's left chest, breast, and axillary area were prepped with ChloraPrep, allowed to dry, and draped in usual sterile manner.  An appropriate timeout was performed.  Previously the patient had a lumpectomy in the upper outer quadrant of the left breast for what was thought to be benign disease.  She ended up having a small 5 mm cancer that was transected at the lateral margin.  She is here for excision of her margins.  Earlier in the day the patient also underwent injection 1 mCi of technetium sulfur colloid in the subareolar position on the left.  The neoprobe was set to technetium and an area of radioactivity was readily identified in the left axilla.  Initially the area around the previous incision was infiltrated with quarter percent Marcaine.  An elliptical incision was made around her previous incision with a 15 blade knife.  The incision was carried through the skin and subcutaneous tissue sharply with the electrocautery and around the previous area of resection.  This dissection was carried all  the way to the chest wall.  Once the specimen was removed which included all the margins of the previous resection the specimen was then marked with the appropriate paint colors.  The specimen was then sent to pathology for further evaluation.  The neoprobe was then used to identify the hot spot in the left axilla.  This was very close to the current lumpectomy cavity.  We then used the electrocautery to dissected into the deep left axillary space under the direction of the neoprobe.  I was able to identify 2 hot lymph nodes.  Each of these was excised sharply with the electrocautery and the surrounding small vessels and lymphatics were controlled with clips.  Ex vivo counts on these nodes ranged from 400 to 1500.  No other hot or palpable nodes were identified in the left axilla.  Hemostasis was achieved using the Bovie electrocautery.  The deep layer of the incision was then closed with interrupted 3-0 Vicryl stitches.  The lumpectomy cavity was then irrigated with copious amounts of saline and infiltrated with more quarter percent Marcaine.  The cavity was marked with clips.  The deep layer of the wound was then closed with layers of interrupted 3-0 Vicryl stitches.  The skin was then closed with a running 4-0 Monocryl subcuticular stitch.  Dermabond dressings were applied.  The patient tolerated the procedure well.  At the end of the case all needle sponge and instrument counts were correct.  The patient was then awakened and taken  to recovery in stable condition.  PLAN OF CARE: Discharge to home after PACU  PATIENT DISPOSITION:  PACU - hemodynamically stable.   Delay start of Pharmacological VTE agent (>24hrs) due to surgical blood loss or risk of bleeding: not applicable

## 2020-03-04 NOTE — Interval H&P Note (Signed)
History and Physical Interval Note:  03/04/2020 11:34 AM  Cheyenne Garner  has presented today for surgery, with the diagnosis of LEFT BREAST CANCER.  The various methods of treatment have been discussed with the patient and family. After consideration of risks, benefits and other options for treatment, the patient has consented to  Procedure(s): LEFT BREAST MARGIN REEXCISION (Left) SENTINEL NODE BIOPSY (N/A) as a surgical intervention.  The patient's history has been reviewed, patient examined, no change in status, stable for surgery.  I have reviewed the patient's chart and labs.  Questions were answered to the patient's satisfaction.     [name]

## 2020-03-04 NOTE — Anesthesia Postprocedure Evaluation (Signed)
Anesthesia Post Note  Patient: Cheyenne Garner  Procedure(s) Performed: LEFT BREAST MARGIN REEXCISION (Left Breast) SENTINEL NODE BIOPSY (Left Breast)     Patient location during evaluation: PACU Anesthesia Type: Regional and General Level of consciousness: awake and alert Pain management: pain level controlled Vital Signs Assessment: post-procedure vital signs reviewed and stable Respiratory status: spontaneous breathing, nonlabored ventilation and respiratory function stable Cardiovascular status: blood pressure returned to baseline and stable Postop Assessment: no apparent nausea or vomiting Anesthetic complications: no   No complications documented.  Last Vitals:  Vitals:   03/04/20 1345 03/04/20 1400  BP: 120/86 120/79  Pulse: 75 70  Resp: 13 14  Temp:    SpO2: 100% 100%    Last Pain:  Vitals:   03/04/20 1400  TempSrc:   PainSc: 0-No pain                 Merlinda Frederick

## 2020-03-04 NOTE — Discharge Instructions (Signed)
No Tylenol until 5:06 pm No Ibuprofen or Motrin until 7:06 pm   Post Anesthesia Home Care Instructions  Activity: Get plenty of rest for the remainder of the day. A responsible individual must stay with you for 24 hours following the procedure.  For the next 24 hours, DO NOT: -Drive a car -Paediatric nurse -Drink alcoholic beverages -Take any medication unless instructed by your physician -Make any legal decisions or sign important papers.  Meals: Start with liquid foods such as gelatin or soup. Progress to regular foods as tolerated. Avoid greasy, spicy, heavy foods. If nausea and/or vomiting occur, drink only clear liquids until the nausea and/or vomiting subsides. Call your physician if vomiting continues.  Special Instructions/Symptoms: Your throat may feel dry or sore from the anesthesia or the breathing tube placed in your throat during surgery. If this causes discomfort, gargle with warm salt water. The discomfort should disappear within 24 hours.  If you had a scopolamine patch placed behind your ear for the management of post- operative nausea and/or vomiting:  1. The medication in the patch is effective for 72 hours, after which it should be removed.  Wrap patch in a tissue and discard in the trash. Wash hands thoroughly with soap and water. 2. You may remove the patch earlier than 72 hours if you experience unpleasant side effects which may include dry mouth, dizziness or visual disturbances. 3. Avoid touching the patch. Wash your hands with soap and water after contact with the patch.  Information for Discharge Teaching: EXPAREL (bupivacaine liposome injectable suspension)   Your surgeon or anesthesiologist gave you EXPAREL(bupivacaine) to help control your pain after surgery.   EXPAREL is a local anesthetic that provides pain relief by numbing the tissue around the surgical site.  EXPAREL is designed to release pain medication over time and can control pain for up to  72 hours.  Depending on how you respond to EXPAREL, you may require less pain medication during your recovery.  Possible side effects:  Temporary loss of sensation or ability to move in the area where bupivacaine was injected.  Nausea, vomiting, constipation  Rarely, numbness and tingling in your mouth or lips, lightheadedness, or anxiety may occur.  Call your doctor right away if you think you may be experiencing any of these sensations, or if you have other questions regarding possible side effects.  Follow all other discharge instructions given to you by your surgeon or nurse. Eat a healthy diet and drink plenty of water or other fluids.  If you return to the hospital for any reason within 96 hours following the administration of EXPAREL, it is important for health care providers to know that you have received this anesthetic. A teal colored band has been placed on your arm with the date, time and amount of EXPAREL you have received in order to alert and inform your health care providers. Please leave this armband in place for the full 96 hours following administration, and then you may remove the band.

## 2020-03-04 NOTE — Anesthesia Procedure Notes (Signed)
Procedure Name: LMA Insertion Date/Time: 03/04/2020 12:15 PM Performed by: Imagene Riches, CRNA Pre-anesthesia Checklist: Patient identified, Emergency Drugs available, Suction available and Patient being monitored Patient Re-evaluated:Patient Re-evaluated prior to induction Oxygen Delivery Method: Circle System Utilized Preoxygenation: Pre-oxygenation with 100% oxygen Induction Type: IV induction Ventilation: Mask ventilation without difficulty LMA: LMA inserted LMA Size: 4.0 Number of attempts: 1 Airway Equipment and Method: Bite block Placement Confirmation: positive ETCO2 Tube secured with: Tape Dental Injury: Teeth and Oropharynx as per pre-operative assessment

## 2020-03-04 NOTE — Progress Notes (Signed)
Assisted Dr. Bass with left, ultrasound guided, pectoralis block. Side rails up, monitors on throughout procedure. See vital signs in flow sheet. Tolerated Procedure well. °

## 2020-03-05 NOTE — Addendum Note (Signed)
Addendum  created 03/05/20 1146 by Maryella Shivers, CRNA   Charge Capture section accepted

## 2020-03-08 ENCOUNTER — Encounter (HOSPITAL_BASED_OUTPATIENT_CLINIC_OR_DEPARTMENT_OTHER): Payer: Self-pay | Admitting: General Surgery

## 2020-03-08 LAB — SURGICAL PATHOLOGY

## 2020-03-08 NOTE — Progress Notes (Signed)
New Breast Cancer Diagnosis: Left Breast Cancer- UOQ  Did patient present with symptoms (if so, please note symptoms) or screening mammography?:Nipple Bleeding or Discharge.  She was having discharge from her right nipple.   MRI Breast 11/11/2019: 2 areas of enhancement, one in each breast.  Location and Extent of disease :  left breast. Located in UOQ, measured  0.5 cm in greatest dimension. Adenopathy No.  Histology per Pathology Report: Intermediate grade,Invasive Ductal Carcinoma, DCIS  Left Lumpectomy 03/05/2019   Bilateral Lumpectomy 02/12/2020  Receptor Status: ER(positive95%), PR (positive40%), Her2-neu (negative), Ki-(2%)  Surgeon and surgical plan, if any: Dr. Marlou Starks -03/04/2020: Left breast margin re-excision, SLN biopsy -02/12/2020: Bilateral breast lumpectomy with radioactive seed localization   Medical oncologist, treatment if any:   [name] 03/16/2020  Lymphedema issues, if any:  No  Pain issues, if any:  Has occasional sharp shooting pains in the right breast.  Continues to have some tenderness from recent surgery 03/04/2020.  SAFETY ISSUES: Prior radiation? No Pacemaker/ICD? No Possible current pregnancy? Postmenopausal Is the patient on methotrexate? No  Current Complaints / other details:

## 2020-03-09 ENCOUNTER — Telehealth: Payer: Self-pay | Admitting: Genetic Counselor

## 2020-03-09 ENCOUNTER — Ambulatory Visit
Admission: RE | Admit: 2020-03-09 | Discharge: 2020-03-09 | Disposition: A | Discharge status: Home / Self Care | Payer: BC Managed Care – PPO | Source: Ambulatory Visit | Attending: Radiation Oncology | Admitting: Radiation Oncology

## 2020-03-09 ENCOUNTER — Other Ambulatory Visit: Payer: Self-pay

## 2020-03-09 ENCOUNTER — Encounter: Payer: Self-pay | Admitting: Radiation Oncology

## 2020-03-09 DIAGNOSIS — Z79899 Other long term (current) drug therapy: Secondary | ICD-10-CM | POA: Diagnosis not present

## 2020-03-09 DIAGNOSIS — C50412 Malignant neoplasm of upper-outer quadrant of left female breast: Secondary | ICD-10-CM

## 2020-03-09 DIAGNOSIS — Z170 Estrogen receptor positive status [ER+]: Secondary | ICD-10-CM | POA: Diagnosis not present

## 2020-03-09 DIAGNOSIS — J45909 Unspecified asthma, uncomplicated: Secondary | ICD-10-CM | POA: Insufficient documentation

## 2020-03-09 DIAGNOSIS — Z8041 Family history of malignant neoplasm of ovary: Secondary | ICD-10-CM | POA: Diagnosis not present

## 2020-03-09 DIAGNOSIS — F419 Anxiety disorder, unspecified: Secondary | ICD-10-CM | POA: Insufficient documentation

## 2020-03-09 DIAGNOSIS — C50919 Malignant neoplasm of unspecified site of unspecified female breast: Secondary | ICD-10-CM | POA: Insufficient documentation

## 2020-03-09 DIAGNOSIS — F319 Bipolar disorder, unspecified: Secondary | ICD-10-CM | POA: Diagnosis not present

## 2020-03-09 DIAGNOSIS — Z86010 Personal history of colonic polyps: Secondary | ICD-10-CM | POA: Diagnosis not present

## 2020-03-09 DIAGNOSIS — E039 Hypothyroidism, unspecified: Secondary | ICD-10-CM | POA: Insufficient documentation

## 2020-03-09 NOTE — Progress Notes (Signed)
Radiation Oncology         [phone] ________________________________  Name: Cheyenne Garner        MRN: [id number]  Date of Service: 03/09/2020 DOB: 07/01/1965  CC:Blyth, Stacey A, MD  Toth, Paul III, MD     REFERRING PHYSICIAN: Toth, Paul III, MD   DIAGNOSIS: The encounter diagnosis was Malignant neoplasm of upper-outer quadrant of left breast in female, estrogen receptor positive (HCC).   HISTORY OF PRESENT ILLNESS: Cheyenne Garner is a 55 y.o. female with a new diagnosis of left breast cancer. The patient was noted to have a diagnostic breast mammogram due to a palpable abnormality in the right breast a few years ago.  Apparently in 2019 she had a lesion of the right nipple that resolved, but 6 months later this reaccumulated she was fine found to have on diagnostic imaging a dilated duct in the right breast and no intraductal mass or mammographic abnormality was otherwise identified, she was counseled on the rationale for an MRI which occurred on 11/11/2019 and showed a suspicious area of clumped non-mass enhancement within the right breast at 6:00 measuring 1.8 cm and a suspicious irregular enhancing mass in the upper outer quadrant of the left breast measuring 8 mm, a benign-appearing non-enhancing fluid density in the right nipple measuring 1.5 cm was identified, she underwent a biopsy on 12/04/2018 one of the inferior central breast which revealed fibrocystic change and focal atypical lobular hyperplasia without any cancer, a left breast biopsy in the upper outer quadrant showed benign tissue also negative for malignancy.  She elected to proceed with lumpectomy given the findings, this was performed on 02/12/2020 and revealed fibrocystic change with usual ductal hyperplasia and intraductal papilloma in the right lumpectomy specimen.  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 Her cancer was ER/PR positive, HER2 negative with a Ki 67 of 2%.  She underwent reexcision of her left breast margin and sentinel lymph node biopsy on 03/04/2020 which revealed 2 nodes negative for disease and no residual carcinoma in the re-excision specimen.  She is seen today to discuss treatment options for adjuvant radiotherapy. She meets with Dr. Magrinat on 03/16/20.    PREVIOUS RADIATION THERAPY: No   PAST MEDICAL HISTORY:  Past Medical History:  Diagnosis Date  . Allergies   . Anxiety   . Asthma    cough variant asthma developed in last couple of years  . Bipolar 1 disorder (HCC)   . Cervical cancer screening 09/12/2016  . Colon polyp 09/12/2016  . Hx of colonic polyp 09/12/2016   Colonoscopy 2017 with 1 small polyp, done by Dr Outlaw, repeat colonoscopy in 5 years, 2022  . Hypothyroidism   . Preventative health care 07/23/2015  . Thyroid disease        PAST SURGICAL HISTORY: Past Surgical History:  Procedure Laterality Date  . APPENDECTOMY    . BREAST LUMPECTOMY WITH RADIOACTIVE SEED LOCALIZATION Bilateral 02/12/2020   Procedure: BILATERAL BREAST LUMPECTOMY WITH RADIOACTIVE SEED LOCALIZATION;  Surgeon: Toth, Paul III, MD;  Location: [facility];  Service: General;  Laterality: Bilateral;  . RE-EXCISION OF BREAST LUMPECTOMY Left 03/04/2020   Procedure: LEFT BREAST MARGIN REEXCISION;  Surgeon: Toth, Paul III, MD;  Location: [facility];  Service: General;  Laterality: Left;  . SENTINEL NODE   BIOPSY Left 03/04/2020   Procedure: SENTINEL NODE BIOPSY;  Surgeon: Jovita Kussmaul, MD;  Location: [facility];  Service: General;  Laterality: Left;  . SKIN BIOPSY     back moles removed (precancer?)     FAMILY HISTORY:  Family History  Problem Relation Age of Onset  . Arthritis Mother   . Hypertension Father   . Heart disease Father        MI, s/p triple bypass at  age 16  . Neuropathy Father        toxic peripheral   . Cancer Paternal Grandfather        young  . Cholelithiasis Sister   . Mental retardation Sister        ADD, schizoaffective d/o  . Obesity Sister   . Arthritis Brother   . Cancer Cousin 40       ovarian     SOCIAL HISTORY:  reports that she has never smoked. She has never used smokeless tobacco. She reports current alcohol use. She reports that she does not use drugs. The patient is married and lives in [place]. She works for [employer] as an Therapist, sports on [employer]   ALLERGIES: Gentamycin [gentamicin]   MEDICATIONS:  Current Outpatient Medications  Medication Sig Dispense Refill  . albuterol (VENTOLIN HFA) 108 (90 Base) MCG/ACT inhaler Inhale 2 puffs into the lungs every 6 (six) hours as needed for wheezing or shortness of breath. 1 Inhaler 2  . lithium 300 MG tablet Take 300 mg by mouth 2 (two) times daily.    Marland Kitchen oxyCODONE (OXY IR/ROXICODONE) 5 MG immediate release tablet Take 1-2 tablets (5-10 mg total) by mouth every 6 (six) hours as needed for moderate pain, severe pain or breakthrough pain. 10 tablet 0  . Probiotic Product (PROBIOTIC-10 PO) Take 1 capsule by mouth daily.    Marland Kitchen thyroid (ARMOUR) 120 MG tablet Take 120 mg by mouth daily before breakfast. PATIENT TAKES 90MG ON MON-WED-FRI AND 120MG ON TUE-THURS-SAT     No current facility-administered medications for this encounter.     REVIEW OF SYSTEMS: On review of systems, the patient reports that she is doing well overall. She reports she is doing well now that she has a better idea of what her diagnosis is. She feels like she's done well since surgery, and no specific breast complaints are otherwise verbalized.     PHYSICAL EXAM:  Wt Readings from Last 3 Encounters:  03/04/20 140 lb 10.5 oz (63.8 kg)  02/12/20 146 lb 2.6 oz (66.3 kg)  01/21/19 130 lb (59 kg)   Temp Readings from Last 3 Encounters:  03/04/20 (!) 97.4 F (36.3 C)  02/12/20 97.7 F (36.5  C)  05/09/18 98.4 F (36.9 C) (Oral)   BP Readings from Last 3 Encounters:  03/04/20 120/78  02/12/20 127/81  01/21/19 120/70   Pulse Readings from Last 3 Encounters:  03/04/20 68  02/12/20 (!) 58  05/09/18 68    In general this is a well appearing caucasian female in no acute distress. She's alert and oriented x4 and appropriate throughout the examination. Cardiopulmonary assessment is negative for acute distress and she exhibits normal effort. Bilateral breast exam is deferred.    ECOG = 0  0 - Asymptomatic (Fully active, able to carry on all predisease activities without restriction)  1 - Symptomatic but completely ambulatory (Restricted in physically strenuous activity but ambulatory and able to carry out work of a light or sedentary nature. For example, light  housework, office work)  2 - Symptomatic, <50% in bed during the day (Ambulatory and capable of all self care but unable to carry out any work activities. Up and about more than 50% of waking hours)  3 - Symptomatic, >50% in bed, but not bedbound (Capable of only limited self-care, confined to bed or chair 50% or more of waking hours)  4 - Bedbound (Completely disabled. Cannot carry on any self-care. Totally confined to bed or chair)  5 - Death   Oken MM, Creech RH, Tormey DC, et al. (1982). "Toxicity and response criteria of the Eastern Cooperative Oncology Group". Am. J. Clin. Oncol. 5 (6): 649-55    LABORATORY DATA:  Lab Results  Component Value Date   WBC 7.3 11/06/2017   HGB 13.9 11/06/2017   HCT 41.3 11/06/2017   MCV 90.1 11/06/2017   PLT 171.0 11/06/2017   Lab Results  Component Value Date   NA 140 11/06/2017   K 4.0 11/06/2017   CL 104 11/06/2017   CO2 29 11/06/2017   Lab Results  Component Value Date   ALT 11 11/06/2017   AST 11 11/06/2017   ALKPHOS 61 11/06/2017   BILITOT 0.5 11/06/2017      RADIOGRAPHY: NM Sentinel Node Inj-No Rpt (Breast)  Result Date: 03/04/2020 Sulfur colloid  was injected by the nuclear medicine technologist for melanoma sentinel node.   MM Breast Surgical Specimen  Result Date: 02/12/2020 CLINICAL DATA:  Status post seed localized LEFT lumpectomy. EXAM: SPECIMEN RADIOGRAPH OF THE LEFT BREAST COMPARISON:  Previous exam(s). FINDINGS: Status post excision of the left breast. The radioactive seed and barbell shaped biopsy marker clip are present, completely intact. The findings are discussed with the operating room nurse at the time of interpretation. IMPRESSION: Specimen radiograph of the left breast. Electronically Signed   By: Elizabeth  Brown M.D.   On: 02/12/2020 09:38   MM Breast Surgical Specimen  Result Date: 02/12/2020 CLINICAL DATA:  Status post seed localized RIGHT breast lumpectomy. EXAM: SPECIMEN RADIOGRAPH OF THE RIGHT BREAST COMPARISON:  Previous exam(s). FINDINGS: Status post excision of the RIGHT breast. The radioactive seed and barbell shaped biopsy marker clip are present and completely intact. The findings are discussed with the operating room nurse at the time of interpretation. IMPRESSION: Specimen radiograph of the right breast. Electronically Signed   By: Elizabeth  Brown M.D.   On: 02/12/2020 09:36   MM LT RADIOACTIVE SEED LOC MAMMO GUIDE  Result Date: 02/11/2020 CLINICAL DATA:  54-year-old with biopsy-proven high risk atypical lobular hyperplasia involving the LOWER INNER QUADRANT of the RIGHT breast and a discordant benign biopsy of a mass in the UPPER OUTER QUADRANT of the LEFT breast. EXAM: MAMMOGRAPHIC GUIDED RADIOACTIVE SEED LOCALIZATION OF THE RIGHT BREAST MAMMOGRAPHIC GUIDED RADIOACTIVE SEED LOCALIZATION OF THE LEFT BREAST COMPARISON:  Previous exam(s). FINDINGS: Patient presents for radioactive seed localization prior to excisional biopsies from both breasts. I met with the patient and we discussed the procedure of seed localization including benefits and alternatives. We discussed the high likelihood of a successful procedure.  We discussed the risks of the procedure including infection, bleeding, tissue injury and further surgery. We discussed the low dose of radioactivity involved in the procedure. Informed, written consent was given. The usual time-out protocol was performed immediately prior to the procedure. # 1) RIGHT BREAST: Using mammographic guidance, sterile technique with chlorhexidine as skin antisepsis, 1% lidocaine as local anesthetic, an I-125 radioactive seed was used to localize the barbell shaped tissue marker clip associated with   the biopsy-proven ALH in the LOWER INNER QUADRANT using a lateral approach. The follow-up mammogram images confirm that the seed is appropriately positioned immediately adjacent to the clip. The images are marked for Dr. Toth. Follow-up survey of the patient confirms the presence of the radioactive seed. Order number of I-125 seed: [id number] Total activity: 0.248 mCi Reference Date: 01/20/2020 # 2) LEFT BREAST: Using mammographic guidance, sterile technique with chlorhexidine as skin antisepsis, 1% lidocaine as local anesthetic, an I-125 radioactive seed was used to localize the barbell shaped tissue marker clip in the UPPER OUTER QUADRANT using a lateral approach. The follow-up mammogram images confirm that the seed is appropriately position immediately adjacent to the clip. The images are marked for Dr. Toth. Order number of I-125 seed: [id number] Total activity: 0.248 mCi Reference Date: 01/20/2020 The patient tolerated the procedure well and was released from the Breast Center. She was given instructions regarding seed removal. IMPRESSION: Radioactive seed localization of both breasts. No apparent complications. Electronically Signed   By: Thomas  Lawrence M.D.   On: 02/11/2020 15:11   MM RT RADIOACTIVE SEED LOC MAMMO GUIDE  Result Date: 02/11/2020 CLINICAL DATA:  54-year-old with biopsy-proven high risk atypical lobular hyperplasia involving the LOWER INNER QUADRANT of the RIGHT breast  and a discordant benign biopsy of a mass in the UPPER OUTER QUADRANT of the LEFT breast. EXAM: MAMMOGRAPHIC GUIDED RADIOACTIVE SEED LOCALIZATION OF THE RIGHT BREAST MAMMOGRAPHIC GUIDED RADIOACTIVE SEED LOCALIZATION OF THE LEFT BREAST COMPARISON:  Previous exam(s). FINDINGS: Patient presents for radioactive seed localization prior to excisional biopsies from both breasts. I met with the patient and we discussed the procedure of seed localization including benefits and alternatives. We discussed the high likelihood of a successful procedure. We discussed the risks of the procedure including infection, bleeding, tissue injury and further surgery. We discussed the low dose of radioactivity involved in the procedure. Informed, written consent was given. The usual time-out protocol was performed immediately prior to the procedure. # 1) RIGHT BREAST: Using mammographic guidance, sterile technique with chlorhexidine as skin antisepsis, 1% lidocaine as local anesthetic, an I-125 radioactive seed was used to localize the barbell shaped tissue marker clip associated with the biopsy-proven ALH in the LOWER INNER QUADRANT using a lateral approach. The follow-up mammogram images confirm that the seed is appropriately positioned immediately adjacent to the clip. The images are marked for Dr. Toth. Follow-up survey of the patient confirms the presence of the radioactive seed. Order number of I-125 seed: [id number] Total activity: 0.248 mCi Reference Date: 01/20/2020 # 2) LEFT BREAST: Using mammographic guidance, sterile technique with chlorhexidine as skin antisepsis, 1% lidocaine as local anesthetic, an I-125 radioactive seed was used to localize the barbell shaped tissue marker clip in the UPPER OUTER QUADRANT using a lateral approach. The follow-up mammogram images confirm that the seed is appropriately position immediately adjacent to the clip. The images are marked for Dr. Toth. Order number of I-125 seed: [id number] Total  activity: 0.248 mCi Reference Date: 01/20/2020 The patient tolerated the procedure well and was released from the Breast Center. She was given instructions regarding seed removal. IMPRESSION: Radioactive seed localization of both breasts. No apparent complications. Electronically Signed   By: Thomas  Lawrence M.D.   On: 02/11/2020 15:11       IMPRESSION/PLAN: 1. Stage IA, [id number] grade 2, ER/PR positive invasive ductal carcinoma of the left breast. Dr. Moody discusses the pathology findings and reviews the nature of left breast disease. The patient is doing well since surgery and   we discussed that her case will be discussed in conference tomorrow but we do not anticipate that [name] will recommend chemotherapy. Provided that chemotherapy is not indicated, the patient's course would then be followed by external radiotherapy to the breast  to reduce risks of local recurrence followed by antiestrogen therapy. We discussed the risks, benefits, short, and long term effects of radiotherapy, as well as the curative intent, and the patient is interested in proceeding. Dr. Lisbeth Renshaw discusses the delivery and logistics of radiotherapy and anticipates a course of 4 weeks of radiotherapy to the left breast with deep inspiration breath hold technique. Written consent is obtained and placed in the chart, a copy was provided to the patient. She will simulate on 03/24/20 and start treatment thereafter. 2. Possible genetic predisposition to malignancy. The patient may be a candidate for genetic testing given her personal history of breast cancer and family history of a cousin with breast and a more distant cousin with ovarian cancer. She was offered referral and will be contacted for an appointment.   In a visit lasting 60 minutes, greater than 50% of the time was spent face to face reviewing her case, as well as in preparation of, discussing, and coordinating the patient's care.  The above documentation reflects my  direct findings during this shared patient visit. Please see the separate note by Dr. Lisbeth Renshaw on this date for the remainder of the patient's plan of care.    Carola Rhine, [facility]    **Disclaimer: This note was dictated with voice recognition software. Similar sounding words can inadvertently be transcribed and this note may contain transcription errors which may not have been corrected upon publication of note.**

## 2020-03-09 NOTE — Telephone Encounter (Signed)
Received a genetic counseling referral from [name], [place]. Ms. Cheyenne Garner has been scheduled to see Raquel Sarna on 3/1 at [facility] will notify the pt of the appt.

## 2020-03-10 ENCOUNTER — Encounter: Payer: Self-pay | Admitting: Licensed Clinical Social Worker

## 2020-03-10 NOTE — Progress Notes (Signed)
[facility]  Initial Assessment   Cheyenne Garner is a 55 y.o. year old female contacted by phone. Clinical Social Work was referred by distress screen for assessment of psychosocial needs.   SDOH (Social Determinants of Health) assessments performed: No   Distress Screen completed: Yes ONCBCN DISTRESS SCREENING 03/09/2020  Screening Type Initial Screening  Distress experienced in past week (1-10) 7  Practical problem type Work/school  Emotional problem type Nervousness/Anxiety;Adjusting to illness    Family/Social Information:  . Housing Arrangement: patient lives with husband . Family members/support persons in your life? Family, Friends/Colleagues and Professional counseling . Transportation concerns: no  . 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 Income source: Employment. Trying to figure out what to do with her work schedule with upcoming radiation treatment . Financial concerns: No . Medication Concerns: nervous about what anti-estrogen therapy she will end up doing (will see [name] on 3/1)  . Services Currently in place:  counseling  Coping/ Adjustment to diagnosis: . Patient understands treatment plan and what happens next? yes . Concerns about diagnosis and/or treatment: general adjustment;  trying to organize work around appointments . Patient reported stressors: Work/ school and Adjusting to my illness . Hopes and priorities: hopes to get through treatment and be able to return to her full active lifestyle . Patient enjoys exercise and time with family/ friends . Current coping skills/ strengths: Average or above average intelligence, Capable of independent living, Communication skills, Motivation for treatment/growth, Special hobby/interest and Supportive family/friends    SUMMARY: Current SDOH Barriers:  . No significant SDOH barriers noted today  Clinical Social Work Clinical Goal(s):   Marland Kitchen No clinical SW goals at this time  Interventions: . Discussed common feeling and emotions when being diagnosed with cancer, and the importance of support during treatment . Informed patient of the support team roles and support services at [facility] . Provided CSW contact information and encouraged patient to call with any questions or concerns . Referred patient for an Alight guide. Added to support program mailing list   Follow Up Plan: Patient will contact CSW with any support or resource needs Patient verbalizes understanding of plan: Yes    Christeen Douglas , LCSW

## 2020-03-12 ENCOUNTER — Other Ambulatory Visit: Payer: Self-pay | Admitting: *Deleted

## 2020-03-12 DIAGNOSIS — Z170 Estrogen receptor positive status [ER+]: Secondary | ICD-10-CM

## 2020-03-12 DIAGNOSIS — C50412 Malignant neoplasm of upper-outer quadrant of left female breast: Secondary | ICD-10-CM

## 2020-03-15 NOTE — Progress Notes (Signed)
Cheyenne Garner  Telephone:([phone]) [phone] Fax:(336) [phone]     ID: NIJA KOOPMAN DOB: 03-Mar-1965  MR#: [id number]  LDJ#:[id number]  Patient Care Team: Mosie Lukes, MD as PCP - General (Family Medicine) Jovita Kussmaul, MD as Consulting Physician (General Surgery) Raizel Wesolowski, Virgie Dad, MD as Consulting Physician (Oncology) Kyung Rudd, MD as Consulting Physician (Radiation Oncology) Arta Silence, MD as Consulting Physician (Gastroenterology) Renda Rolls, Jennefer Bravo, MD as Referring Physician (Dermatology) Chauncey Cruel, MD OTHER MD:  CHIEF COMPLAINT: Estrogen receptor positive breast cancer  CURRENT TREATMENT: Adjuvant radiation   HISTORY OF CURRENT ILLNESS: Cheyenne Garner was initially evaluated in 05/2017 for a right nipple/breast mass. Diagnostic mammography showed no evidence of malignancy, and she was subsequently referred to Dr. Marlou Starks for further evaluation. She elected for follow up of the lesion. She was then seen by dermatology, who drained the lesion. The lesion resolved but began to reoccur 6 months later. She underwent bilateral diagnostic mammography with tomography and right breast ultrasonography at [facility] on 10/21/2019 showing: breast density category C; dilated duct and nipple discharge from right breast; no intraductal mass or mammographic abnormality identified; right axilla negative for adenopathy.  She underwent breast MRI on 11/11/2019 showing: breast composition C; 1.8 cm linear clumped non-mass enhancement within lower right breast, 6 o'clock; 8 mm irregular enhancing mass within upper-outer left breast; benign-appearing 1.5 cm nonenhancing fluid-intensity mass/collection within right nipple with probable subareolar extension, corresponding to clinical area of concern.  Accordingly on 12/04/2019 she proceeded to biopsy of the bilateral breast areas in question. The pathology from this procedure ([id number]) showed:  1. Right Breast  -  fibrocystic changes  - focal atypical lobular hyperplasia  --concordant   2. Left Breast  - benign breast tissue --discordant  As noted, the right breast biopsy was found to be concordant, but the left breast biopsy was discordant. She proceeded to bilateral lumpectomies on 02/12/2020 under Dr. Marlou Starks. Pathology from the procedure ([id number]) showed: 1. Right Breast  - fibrocystic change  - columnar cell change  - usual ductal hyperplasia  - intraductal papilloma  - associated microcalcifications 2. 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 Proliferation marker Ki67 at 2%. HER2 equivocal by immunohistochemistry (2+), but negative by fluorescent in situ hybridization with a signals ratio 1.19 and number per cell 1.73.  She underwent re-excision of the positive margin, as well as left sentinel lymph node biopsy, on 03/04/2020. Pathology [id number]) showed: no residual carcinoma; benign lymph nodes (0/3).  Cancer Staging Malignant neoplasm of upper-outer quadrant of left breast in female, estrogen receptor positive ([place]) Staging form: Breast, AJCC 8th Edition - Pathologic stage from 03/09/2020: Stage IA (pT1a, pN0, cM0, G2, ER+, PR+, HER2-) - Signed by Chauncey Cruel, MD on 03/16/2020 Stage prefix: Initial diagnosis Multigene prognostic tests performed: None Histologic grading system: 3 grade system  The patient's subsequent history is as detailed below.   INTERVAL HISTORY: [name] was evaluated in the breast cancer clinic on 03/16/2020 accompanied by her husband Cheyenne Garner.  She also met with the genetics counselor today and with Dr. Lisbeth Renshaw on 03/09/2020 to discuss radiation therapy.   REVIEW OF SYSTEMS: The patient denies  unusual headaches, visual changes, nausea, vomiting, stiff neck, dizziness, or gait imbalance. There has been no  cough, phlegm production, or pleurisy, no chest pain or pressure, and no change in bowel or bladder habits. The patient denies fever, rash, bleeding, unexplained fatigue or unexplained weight loss. A detailed review of systems was otherwise entirely negative.   COVID 19 VACCINATION STATUS: [place] x1, most recently 01/2019   PAST MEDICAL HISTORY: Past Medical History:  Diagnosis Date  . Allergies   . Anxiety   . Asthma    cough variant asthma developed in last couple of years  . Bipolar 1 disorder ([place])   . Cervical cancer screening 09/12/2016  . Colon polyp 09/12/2016  . Family history of bladder cancer   . Family history of breast cancer   . Family history of ovarian cancer   . Family history of prostate cancer   . Hx of colonic polyp 09/12/2016   Colonoscopy 2017 with 1 small polyp, done by Dr [name], repeat colonoscopy in 5 years, 2022  . Hypothyroidism   . Preventative health care 07/23/2015  . Thyroid disease     PAST SURGICAL HISTORY: Past Surgical History:  Procedure Laterality Date  . APPENDECTOMY    . BREAST LUMPECTOMY WITH RADIOACTIVE SEED LOCALIZATION Bilateral 02/12/2020   Procedure: BILATERAL BREAST LUMPECTOMY WITH RADIOACTIVE SEED LOCALIZATION;  Surgeon: Jovita Kussmaul, MD;  Location: [facility];  Service: General;  Laterality: Bilateral;  . RE-EXCISION OF BREAST LUMPECTOMY Left 03/04/2020   Procedure: LEFT BREAST MARGIN REEXCISION;  Surgeon: Jovita Kussmaul, MD;  Location: [facility];  Service: General;  Laterality: Left;  . SENTINEL NODE BIOPSY Left 03/04/2020   Procedure: SENTINEL NODE BIOPSY;  Surgeon: Jovita Kussmaul, MD;  Location: [facility];  Service: General;  Laterality: Left;  . SKIN BIOPSY     back moles removed (precancer?)    FAMILY HISTORY: Family History  Problem Relation Age of Onset  . Arthritis  Mother   . Hypertension Father   . Heart disease Father        MI, s/p triple bypass at age 74  . Neuropathy Father        toxic peripheral   . Cancer Paternal Grandfather        young, lung cancer?, heavy smoker  . Cholelithiasis Sister   . Aortic aneurysm Sister 52  . Mental retardation Sister        ADD, schizoaffective d/o  . Obesity Sister   . Arthritis Brother   . Ovarian cancer Cousin 22       maternal second cousin (MGF's great-niece)  . Dementia Maternal Grandmother   . Osteoarthritis Maternal Grandmother   . Macular degeneration Maternal Grandfather   . Osteoarthritis Maternal Grandfather   . Congestive Heart Failure Paternal Grandmother   . Breast cancer Cousin 30       paternal first cousin  . Prostate cancer Paternal Uncle        spread to bladder, d. 60s/70s  . Cancer Niece 11       soft tissue sarcoma  From the genetic counselors note (February 2022):  "Ms. Tarnowski's mother is alive at age 49 and may have had basal cell carcinoma of her face (unconfirmed). There were three maternal aunts. There is no known cancer among maternal aunts/uncles or maternal first cousins. Ms. Kizer maternal grandmother died at age 30 without cancer. Her maternal grandfather died at age 58 without cancer. A maternal second cousin (MGF's great-niece) died from ovarian cancer at age 88. A maternal first cousin once removed (MGF's niece) had an  unknown type of gynecologic cancer in her 56s.   Ms. Jerez father is alive at age 12 and was diagnosed with bladder cancer at age 72. There are two paternal aunts and four paternal uncles. Once uncle died in his late 13s or early 54s with prostate cancer and bladder cancer. One paternal first cousin was diagnosed with breast cancer around age 58. Ms. Graciano paternal grandmother died in her 61s without cancer. Her paternal grandfather died in his 56s from an unknown type of cancer (not colon cancer, possibly lung cancer). A paternal first cousin  once removed (PGF's nephew) had lung cancer in his 32s, and died in his 51s with prostate cancer and bladder cancer.  "  GYNECOLOGIC HISTORY:  No LMP recorded. Patient is postmenopausal. Menarche: 55 years old Age at first live birth: 55 years old [place] 2 LMP age 61 Contraceptive just around HRT yes at least 4 years  Hysterectomy? no BSO? 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  Her husband Lynelle Smoke is an Chief Financial Officer.  He takes care of our blood analyzer is here and throughout the system.  Son Kevan Ny started forestry and lives in [place] where he works for [employer] restoration taking care of their trails.  Son Alverda Skeans is 43  ADVANCED DIRECTIVES: In the absence of any documents to the contrary the patient's husband is her healthcare power of attorney   HEALTH MAINTENANCE: Social History   Tobacco Use  . Smoking status: Never Smoker  . Smokeless tobacco: Never Used  Substance Use Topics  . Alcohol use: Yes    Comment: social  . Drug use: No     Colonoscopy: 02/2015, Dr. [name], recall 2022  PAP: 08/2016, negative  Bone density: Never done   Allergies  Allergen Reactions  . Gentamycin [Gentamicin]     Reacted to eye ointment, swelling red painful eyes    Current Outpatient Medications  Medication Sig Dispense Refill  . albuterol (VENTOLIN HFA) 108 (90 Base) MCG/ACT inhaler Inhale 2 puffs into the lungs every 6 (six) hours as needed for wheezing or shortness of breath. 1 Inhaler 2  . lithium 300 MG tablet Take 300 mg by mouth 2 (two) times daily.    . Probiotic Product (PROBIOTIC-10 PO) Take 1 capsule by mouth daily.    Marland Kitchen thyroid (ARMOUR) 120 MG tablet Take 120 mg by mouth daily before breakfast. PATIENT TAKES $RemoveBefor'90MG'giGwsiuSKSeb$  ON MON-WED-FRI AND $RemoveBeforeD'120MG'BwWSpXmFoYAYnh$  ON TUE-THURS-SAT    . oxyCODONE (OXY IR/ROXICODONE) 5 MG immediate release tablet Take 1-2 tablets (5-10 mg total) by mouth every 6 (six) hours as needed for moderate pain, severe  pain or breakthrough pain. (Patient not taking: No sig reported) 10 tablet 0   No current facility-administered medications for this visit.    OBJECTIVE: White woman in no acute distress  Vitals:   03/16/20 1603  BP: 109/70  Pulse: 72  Resp: 20  Temp: (!) 97.3 F (36.3 C)  SpO2: 100%     Body mass index is 22.43 kg/m.   Wt Readings from Last 3 Encounters:  03/16/20 141 lb 1.6 oz (64 kg)  03/09/20 143 lb (64.9 kg)  03/04/20 140 lb 10.5 oz (63.8 kg)      ECOG FS:1 - Symptomatic but completely ambulatory  Ocular: Sclerae unicteric, pupils round and equal Ear-nose-throat: Wearing a mask Lymphatic: No cervical or supraclavicular adenopathy Lungs no rales or rhonchi Heart regular rate and rhythm Abd soft,  nontender, positive bowel sounds MSK no focal spinal tenderness, no joint edema Neuro: non-focal, well-oriented, appropriate affect Breasts: The right breast is unremarkable.  The left breast is status post recent lumpectomy.  The incisions are healing very nicely with no dehiscence erythema or swelling.  Both axillae are benign   LAB RESULTS:  CMP     Component Value Date/Time   NA 143 03/16/2020 1546   K 3.7 03/16/2020 1546   CL 105 03/16/2020 1546   CO2 28 03/16/2020 1546   GLUCOSE 91 03/16/2020 1546   BUN 19 03/16/2020 1546   CREATININE 0.87 03/16/2020 1546   CALCIUM 9.4 03/16/2020 1546   PROT 7.0 03/16/2020 1546   ALBUMIN 4.2 03/16/2020 1546   AST 13 (L) 03/16/2020 1546   ALT 15 03/16/2020 1546   ALKPHOS 65 03/16/2020 1546   BILITOT 0.7 03/16/2020 1546   GFRNONAA >60 03/16/2020 1546   GFRAA >60 06/25/2014 1948    No results found for: TOTALPROTELP, ALBUMINELP, A1GS, A2GS, BETS, BETA2SER, GAMS, MSPIKE, SPEI  Lab Results  Component Value Date   WBC 6.0 03/16/2020   NEUTROABS 3.2 03/16/2020   HGB 14.0 03/16/2020   HCT 43.3 03/16/2020   MCV 92.5 03/16/2020   PLT 145 (L) 03/16/2020    No results found for: LABCA2  No components found for:  [id number]  No results for input(s): INR in the last 168 hours.  No results found for: LABCA2  No results found for: IFO277  No results found for: AJO878  No results found for: MVE720  No results found for: CA2729  No components found for: HGQUANT  No results found for: CEA1 / No results found for: CEA1   No results found for: AFPTUMOR  No results found for: CHROMOGRNA  No results found for: KPAFRELGTCHN, LAMBDASER, KAPLAMBRATIO (kappa/lambda light chains)  No results found for: HGBA, HGBA2QUANT, HGBFQUANT, HGBSQUAN (Hemoglobinopathy evaluation)   No results found for: LDH  No results found for: IRON, TIBC, IRONPCTSAT (Iron and TIBC)  No results found for: FERRITIN  Urinalysis    Component Value Date/Time   COLORURINE YELLOW 06/25/2014 [place] 06/25/2014 1850   LABSPEC 1.029 06/25/2014 1850   PHURINE 5.5 06/25/2014 1850   GLUCOSEU NEGATIVE 06/25/2014 1850   HGBUR TRACE (A) 06/25/2014 1850   BILIRUBINUR NEGATIVE 06/25/2014 1850   KETONESUR NEGATIVE 06/25/2014 1850   PROTEINUR NEGATIVE 06/25/2014 1850   UROBILINOGEN 0.2 06/25/2014 1850   NITRITE NEGATIVE 06/25/2014 1850   LEUKOCYTESUR SMALL (A) 06/25/2014 1850     STUDIES: NM Sentinel Node Inj-No Rpt (Breast)  Result Date: 03/04/2020 Sulfur colloid was injected by the nuclear medicine technologist for melanoma sentinel node.     ELIGIBLE FOR AVAILABLE RESEARCH PROTOCOL: no  ASSESSMENT: 55 y.o. [facility] woman status post bilateral lumpectomies 02/12/2020, showing  (a) in the right breast, an intraductal papilloma and atypical lobular hyperplasia  (b) in the left breast, a pT1a pNX, stage IA invasive ductal carcinoma, grade 2, with positive margins   (i) the invasive disease was estrogen and progesterone receptor positive, HER-2 not amplified, with an MIB-1 of 2%   (ii) additional surgery 03/04/2020 clear margins   (iii) left axillary sentinel lymph node sampling 03/04/2020 removed 2  lymph nodes both negative  (1) adjuvant radiation pending  (2) genetics testing in process  (3) consider antiestrogens   PLAN: I met today with Kaleesi to review her new diagnosis. Specifically we discussed the biology of her breast cancer, its diagnosis, staging, treatment  options  and prognosis. We first reviewed the fact that cancer is not one disease but more than 100 different diseases and that it is important to keep them separate-- otherwise when friends and relatives discuss their own cancer experiences with Danae confusion can result. Similarly we explained that if breast cancer spreads to the bone or liver, the patient would not have bone cancer or liver cancer, but breast cancer in the bone and breast cancer in the liver: one cancer in three places-- not 3 different cancers which otherwise would have to be treated in 3 different ways.  We discussed the difference between local and systemic therapy. In terms of loco-regional treatment, lumpectomy plus radiation is equivalent to mastectomy as far as survival is concerned. For this reason, and because the cosmetic results are generally superior, we recommended breast conserving surgery.  She is now in the queue for radiation treatments  We then discussed the rationale for systemic therapy. There is some risk that this cancer may have already spread to other parts of her body.  However in T1 a tumors that risk is certainly less than 54% more likely in the 5% range.  Accordingly we generally do not recommend chemotherapy in these cases.  We do consider antiestrogens.  We discussed the difference between tamoxifen and anastrozole in detail. She understands that anastrozole and the aromatase inhibitors in general work by blocking estrogen production. Accordingly vaginal dryness, decrease in bone density, and of course hot flashes can result. The aromatase inhibitors can also negatively affect the cholesterol profile, although that is a minor  effect. One out of 5 women on aromatase inhibitors we will feel "old and achy". This arthralgia/myalgia syndrome, which resembles fibromyalgia clinically, does resolve with stopping the medications. Accordingly this is not a reason to not try an aromatase inhibitor but it is a frequent reason to stop it (in other words 20% of women will not be able to tolerate these medications).  Tamoxifen on the other hand does not block estrogen production. It does not "take away a woman's estrogen". It blocks the estrogen receptor in breast cells. Like anastrozole, it can also cause hot flashes. As opposed to anastrozole, tamoxifen has many estrogen-like effects. It is technically an estrogen receptor modulator. This means that in some tissues tamoxifen works like estrogen-- for example it helps strengthen the bones. It tends to improve the cholesterol profile. It can cause thickening of the endometrial lining, and even endometrial polyps or rarely cancer of the uterus.(The risk of uterine cancer due to tamoxifen is one additional cancer per thousand women year). It can cause vaginal wetness or stickiness. It can cause blood clots through this estrogen-like effect--the risk of blood clots with tamoxifen is exactly the same as with birth control pills or hormone replacement.  Neither of these agents causes mood changes or weight gain, despite the popular belief that they can have these side effects. We have data from studies comparing either of these drugs with placebo, and in those cases the control group had the same amount of weight gain and depression as the group that took the drug.  The plan then is to proceed with radiation.  She has the information on antiestrogens and I will see her at the end of radiation to discuss if she wishes to start an antiestrogen and if so which 1.  Assuming she does start 1 we would check again 2 months later to make sure that she is tolerating it well once she is on a definite treatment  (  or no systemic treatment) plan, she can best decide whether she wants to initiate intensified screening.  At this point she is very hesitant because of appropriate concerns regarding false positives  Raseel has a good understanding of the overall plan. She agrees with it. She knows the goal of treatment in her case is cure. She will call with any problems that may develop before her next visit here.  Total encounter time 65 minutes.Sarajane Jews C. Shelvie Salsberry, MD 03/16/2020 5:46 PM Medical Oncology and Hematology [facility] [place], [place] [address] Tel. [phone]    Fax. [phone]   This document serves as a record of services personally performed by Lurline Del, MD. It was created on his behalf by Wilburn Mylar, a trained medical scribe. The creation of this record is based on the scribe's personal observations and the provider's statements to them.   I, Lurline Del MD, have reviewed the above documentation for accuracy and completeness, and I agree with the above.    *Total Encounter Time as defined by the Centers for Medicare and Medicaid Services includes, in addition to the face-to-face time of a patient visit (documented in the note above) non-face-to-face time: obtaining and reviewing outside history, ordering and reviewing medications, tests or procedures, care coordination (communications with other health care professionals or caregivers) and documentation in the medical record.

## 2020-03-16 ENCOUNTER — Inpatient Hospital Stay (HOSPITAL_BASED_OUTPATIENT_CLINIC_OR_DEPARTMENT_OTHER): Payer: BC Managed Care – PPO | Admitting: Genetic Counselor

## 2020-03-16 ENCOUNTER — Other Ambulatory Visit: Payer: Self-pay | Admitting: Genetic Counselor

## 2020-03-16 ENCOUNTER — Inpatient Hospital Stay (HOSPITAL_BASED_OUTPATIENT_CLINIC_OR_DEPARTMENT_OTHER): Payer: BC Managed Care – PPO | Admitting: Oncology

## 2020-03-16 ENCOUNTER — Inpatient Hospital Stay: Payer: BC Managed Care – PPO | Attending: Oncology

## 2020-03-16 ENCOUNTER — Other Ambulatory Visit: Payer: Self-pay

## 2020-03-16 ENCOUNTER — Encounter: Payer: Self-pay | Admitting: Genetic Counselor

## 2020-03-16 VITALS — BP 109/70 | HR 72 | Temp 97.3°F | Resp 20 | Ht 66.5 in | Wt 141.1 lb

## 2020-03-16 DIAGNOSIS — C50412 Malignant neoplasm of upper-outer quadrant of left female breast: Secondary | ICD-10-CM | POA: Diagnosis not present

## 2020-03-16 DIAGNOSIS — Z8042 Family history of malignant neoplasm of prostate: Secondary | ICD-10-CM | POA: Diagnosis not present

## 2020-03-16 DIAGNOSIS — J45909 Unspecified asthma, uncomplicated: Secondary | ICD-10-CM | POA: Insufficient documentation

## 2020-03-16 DIAGNOSIS — Z8052 Family history of malignant neoplasm of bladder: Secondary | ICD-10-CM

## 2020-03-16 DIAGNOSIS — Z803 Family history of malignant neoplasm of breast: Secondary | ICD-10-CM | POA: Insufficient documentation

## 2020-03-16 DIAGNOSIS — Z8041 Family history of malignant neoplasm of ovary: Secondary | ICD-10-CM | POA: Diagnosis not present

## 2020-03-16 DIAGNOSIS — E039 Hypothyroidism, unspecified: Secondary | ICD-10-CM | POA: Insufficient documentation

## 2020-03-16 DIAGNOSIS — Z79899 Other long term (current) drug therapy: Secondary | ICD-10-CM | POA: Diagnosis not present

## 2020-03-16 DIAGNOSIS — Z170 Estrogen receptor positive status [ER+]: Secondary | ICD-10-CM

## 2020-03-16 DIAGNOSIS — Z8051 Family history of malignant neoplasm of kidney: Secondary | ICD-10-CM | POA: Diagnosis not present

## 2020-03-16 LAB — CBC WITH DIFFERENTIAL (CANCER CENTER ONLY)
Abs Immature Granulocytes: 0.01 10*3/uL (ref 0.00–0.07)
Basophils Absolute: 0 10*3/uL (ref 0.0–0.1)
Basophils Relative: 1 %
Eosinophils Absolute: 0.1 10*3/uL (ref 0.0–0.5)
Eosinophils Relative: 2 %
HCT: 43.3 % (ref 36.0–46.0)
Hemoglobin: 14 g/dL (ref 12.0–15.0)
Immature Granulocytes: 0 %
Lymphocytes Relative: 37 %
Lymphs Abs: 2.2 10*3/uL (ref 0.7–4.0)
MCH: 29.9 pg (ref 26.0–34.0)
MCHC: 32.3 g/dL (ref 30.0–36.0)
MCV: 92.5 fL (ref 80.0–100.0)
Monocytes Absolute: 0.5 10*3/uL (ref 0.1–1.0)
Monocytes Relative: 8 %
Neutro Abs: 3.2 10*3/uL (ref 1.7–7.7)
Neutrophils Relative %: 52 %
Platelet Count: 145 10*3/uL — ABNORMAL LOW (ref 150–400)
RBC: 4.68 MIL/uL (ref 3.87–5.11)
RDW: 11.9 % (ref 11.5–15.5)
WBC Count: 6 10*3/uL (ref 4.0–10.5)
nRBC: 0 % (ref 0.0–0.2)

## 2020-03-16 LAB — CMP (CANCER CENTER ONLY)
ALT: 15 U/L (ref 0–44)
AST: 13 U/L — ABNORMAL LOW (ref 15–41)
Albumin: 4.2 g/dL (ref 3.5–5.0)
Alkaline Phosphatase: 65 U/L (ref 38–126)
Anion gap: 10 (ref 5–15)
BUN: 19 mg/dL (ref 6–20)
CO2: 28 mmol/L (ref 22–32)
Calcium: 9.4 mg/dL (ref 8.9–10.3)
Chloride: 105 mmol/L (ref 98–111)
Creatinine: 0.87 mg/dL (ref 0.44–1.00)
GFR, Estimated: >60 mL/min (ref >60)
Glucose, Bld: 91 mg/dL (ref 70–99)
Potassium: 3.7 mmol/L (ref 3.5–5.1)
Sodium: 143 mmol/L (ref 135–145)
Total Bilirubin: 0.7 mg/dL (ref 0.3–1.2)
Total Protein: 7 g/dL (ref 6.5–8.1)

## 2020-03-16 LAB — GENETIC SCREENING ORDER

## 2020-03-16 NOTE — Progress Notes (Signed)
REFERRING PROVIDER: Hayden Pedro, PA-C [place],  [place] [address]  PRIMARY PROVIDER:  Mosie Lukes, MD  PRIMARY REASON FOR VISIT:  1. Malignant neoplasm of upper-outer quadrant of left breast in female, estrogen receptor positive ([place])   2. Family history of breast cancer   3. Family history of ovarian cancer   4. Family history of bladder cancer   5. Family history of prostate cancer      HISTORY OF PRESENT ILLNESS:   Cheyenne Garner, a 55 y.o. female, was seen for a [facility] [facility] cancer genetics consultation at the request of Dr. Dara Lords due to a personal and family history of cancer.  Cheyenne Garner presents to clinic today to discuss the possibility of a hereditary predisposition to cancer, genetic testing, and to further clarify her future cancer risks, as well as potential cancer risks for family members.   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   RISK FACTORS:  Menarche was at age 67.  First live birth at age 17.  OCP use for approximately 0 years.  Ovaries intact: yes.  Hysterectomy: no.  Menopausal status: postmenopausal.  HRT use: 22 years (bioidentical progesterone cream from [age], added estrogen from age [age]). Colonoscopy: yes; 02/24/2015, one polyp. Mammogram within the last year: yes. Number of breast biopsies: 2. Any excessive radiation exposure in the past: no.   Past Medical History:  Diagnosis Date  . Allergies   . Anxiety   . Asthma    cough variant asthma developed in last couple of years  . Bipolar 1 disorder ([place])   . Cervical cancer screening 09/12/2016  . Colon polyp 09/12/2016  . Family history of bladder cancer   . Family history of breast cancer   . Family history of ovarian cancer   . Family history of prostate cancer   . Hx of colonic polyp 09/12/2016   Colonoscopy 2017 with 1 small polyp, done by Dr [name], repeat colonoscopy in 5  years, 2022  . Hypothyroidism   . Preventative health care 07/23/2015  . Thyroid disease     Past Surgical History:  Procedure Laterality Date  . APPENDECTOMY    . BREAST LUMPECTOMY WITH RADIOACTIVE SEED LOCALIZATION Bilateral 02/12/2020   Procedure: BILATERAL BREAST LUMPECTOMY WITH RADIOACTIVE SEED LOCALIZATION;  Surgeon: Jovita Kussmaul, MD;  Location: [facility];  Service: General;  Laterality: Bilateral;  . RE-EXCISION OF BREAST LUMPECTOMY Left 03/04/2020   Procedure: LEFT BREAST MARGIN REEXCISION;  Surgeon: Jovita Kussmaul, MD;  Location: [facility];  Service: General;  Laterality: Left;  . SENTINEL NODE BIOPSY Left 03/04/2020   Procedure: SENTINEL NODE BIOPSY;  Surgeon: Jovita Kussmaul, MD;  Location: [facility];  Service: General;  Laterality: Left;  . SKIN BIOPSY     back moles removed (precancer?)    Social History   Socioeconomic History  . Marital status: Married    Spouse name: Not on file  . Number of children: Not on file  . Years of education: Not on file  . Highest education level: Not on file  Occupational History  . Not on file  Tobacco Use  . Smoking status: Never Smoker  . Smokeless tobacco: Never Used  Substance and Sexual Activity  . Alcohol use: Yes    Comment: social  . Drug use: No  . Sexual activity: Yes  Birth control/protection: None, Post-menopausal  Other Topics Concern  . 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 No major dietary restrictions   Social Determinants of Radio broadcast assistant Strain: Not on file  Food Insecurity: Not on file  Transportation Needs: Not on file  Physical Activity: Not on file  Stress: Not on file  Social Connections: Not on file     FAMILY HISTORY:  We obtained a detailed, 4-generation family history.  Significant diagnoses are listed below: Family History  Problem Relation Age of Onset  .  Arthritis Mother   . Hypertension Father   . Heart disease Father        MI, s/p triple bypass at age 20  . Neuropathy Father        toxic peripheral   . Cancer Paternal Grandfather        young, lung cancer?, heavy smoker  . Cholelithiasis Sister   . Aortic aneurysm Sister 68  . Mental retardation Sister        ADD, schizoaffective d/o  . Obesity Sister   . Arthritis Brother   . Ovarian cancer Cousin 70       maternal second cousin (MGF's great-niece)  . Dementia Maternal Grandmother   . Osteoarthritis Maternal Grandmother   . Macular degeneration Maternal Grandfather   . Osteoarthritis Maternal Grandfather   . Congestive Heart Failure Paternal Grandmother   . Breast cancer Cousin 54       paternal first cousin  . Prostate cancer Paternal Uncle        spread to bladder, d. 60s/70s  . Cancer Niece 11       soft tissue sarcoma   Cheyenne Garner has two sons (ages 57 and 35). She has two brothers (ages 66 and 69) and two sisters (ages 73 and 44). One niece was diagnosed with a soft tissue sarcoma on her ankle around age 19.  Cheyenne Garner mother is alive at age 52 and may have had basal cell carcinoma of her face (unconfirmed). There were three maternal aunts. There is no known cancer among maternal aunts/uncles or maternal first cousins. Cheyenne Garner maternal grandmother died at age 54 without cancer. Her maternal grandfather died at age 81 without cancer. A maternal second cousin (MGF's great-niece) died from ovarian cancer at age 68. A maternal first cousin once removed (MGF's niece) had an unknown type of gynecologic cancer in her 75s.   Cheyenne Garner father is alive at age 56 and was diagnosed with bladder cancer at age 35. There are two paternal aunts and four paternal uncles. Once uncle died in his late 66s or early 33s with prostate cancer and bladder cancer. One paternal first cousin was diagnosed with breast cancer around age 76. Cheyenne Garner paternal grandmother died in her 36s  without cancer. Her paternal grandfather died in his 78s from an unknown type of cancer (not colon cancer, possibly lung cancer). A paternal first cousin once removed (PGF's nephew) had lung cancer in his 42s, and died in his 30s with prostate cancer and bladder cancer.  Cheyenne Garner is unaware of previous family history of genetic testing for hereditary cancer risks. Patient's ancestors are of [place] descent. There is no reported Ashkenazi Jewish ancestry. There is no known consanguinity.  GENETIC COUNSELING ASSESSMENT: Cheyenne Garner is a 55 y.o. female with a personal history of breast cancer and a family history of breast cancer, bladder  cancer, sarcoma, prostate cancer, and ovarian cancer, which is somewhat suggestive of a hereditary cancer syndrome and predisposition to cancer. We, therefore, discussed and recommended the following at today's visit.   DISCUSSION: We discussed that approximately 5-10% of breast cancer is hereditary, with most cases associated with the BRCA1 and BRCA2 genes. There are other genes that can be associated with hereditary breast cancer syndromes. These include ATM, CHEK2, PALB2, etc. We discussed that testing is beneficial for several reasons, including knowing about other cancer risks, identifying potential screening and risk-reduction options that may be appropriate, and to understand if other family members could be at risk for cancer and allow them to undergo genetic testing.  We reviewed the characteristics, features and inheritance patterns of hereditary cancer syndromes. We also discussed genetic testing, including the appropriate family members to test, the process of testing, insurance coverage and turn-around-time for results. We discussed the implications of a negative, positive and/or variant of uncertain significant result. We recommended Cheyenne Garner pursue genetic testing for the [facility] CancerNext-Expanded + RNAinsight gene panel.   The CancerNext-Expanded +  RNAinsight gene panel offered by [facility] and includes sequencing and rearrangement analysis for the following 77 genes: AIP, ALK, APC, ATM, AXIN2, BAP1, BARD1, BLM, BMPR1A, BRCA1, BRCA2, BRIP1, CDC73, CDH1, CDK4, CDKN1B, CDKN2A, CHEK2, CTNNA1, DICER1, FANCC, FH, FLCN, GALNT12, KIF1B, LZTR1, MAX, MEN1, MET, MLH1, MSH2, MSH3, MSH6, MUTYH, NBN, NF1, NF2, NTHL1, PALB2, PHOX2B, PMS2, POT1, PRKAR1A, PTCH1, PTEN, RAD51C, RAD51D, RB1, RECQL, RET, SDHA, SDHAF2, SDHB, SDHC, SDHD, SMAD4, SMARCA4, SMARCB1, SMARCE1, STK11, SUFU, TMEM127, TP53, TSC1, TSC2, VHL and XRCC2 (sequencing and deletion/duplication); EGFR, EGLN1, HOXB13, KIT, MITF, PDGFRA, POLD1 and POLE (sequencing only); EPCAM and GREM1 (deletion/duplication only). RNA data is routinely analyzed for use in variant interpretation for all genes.  Based on Cheyenne Garner's personal and family history of cancer, she meets medical criteria for genetic testing. Despite that she meets criteria, there may still be an out of pocket cost. Based on the policy of [facility], individuals with breast cancer will not pay more than $100 for genetic testing.   PLAN: After considering the risks, benefits, and limitations, Cheyenne Garner provided informed consent to pursue genetic testing and the blood sample was sent to [facility] for analysis of the CancerNext-Expanded + RNAinsight panel. Results should be available within approximately two-three weeks' time, at which point they will be disclosed by telephone to Cheyenne Garner, as will any additional recommendations warranted by these results. Cheyenne Garner will receive a summary of her genetic counseling visit and a copy of her results once available. This information will also be available in Epic.   Cheyenne Garner questions were answered to her satisfaction today. Our contact information was provided should additional questions or concerns arise. Thank you for the referral and allowing [place] to share in the care of your  patient.   Clint Guy, [place], [facility] Licensed, Certified Dispensing [email or web address] Phone: [phone]  The patient was seen for a total of 70 minutes in face-to-face genetic counseling.  This patient was discussed with Drs. Magrinat, Lindi Adie and/or Burr Medico who agrees with the above.    _______________________________________________________________________ For Office Staff:  Number of people involved in session: 1 Was an Intern/ student involved with case: no

## 2020-03-17 ENCOUNTER — Other Ambulatory Visit: Payer: Self-pay | Admitting: Family Medicine

## 2020-03-17 ENCOUNTER — Ambulatory Visit: Payer: BC Managed Care – PPO | Admitting: Hematology and Oncology

## 2020-03-17 ENCOUNTER — Telehealth: Payer: Self-pay | Admitting: Oncology

## 2020-03-17 ENCOUNTER — Encounter: Payer: Self-pay | Admitting: Family Medicine

## 2020-03-17 DIAGNOSIS — M2550 Pain in unspecified joint: Secondary | ICD-10-CM

## 2020-03-17 NOTE — Telephone Encounter (Signed)
Scheduled appts per 3/1 los. Pt confirmed appt date and time.

## 2020-03-18 ENCOUNTER — Other Ambulatory Visit: Payer: Self-pay

## 2020-03-18 ENCOUNTER — Encounter: Payer: Self-pay | Admitting: *Deleted

## 2020-03-18 ENCOUNTER — Other Ambulatory Visit (INDEPENDENT_AMBULATORY_CARE_PROVIDER_SITE_OTHER): Payer: BC Managed Care – PPO

## 2020-03-18 DIAGNOSIS — E079 Disorder of thyroid, unspecified: Secondary | ICD-10-CM | POA: Diagnosis not present

## 2020-03-18 DIAGNOSIS — Z0000 Encounter for general adult medical examination without abnormal findings: Secondary | ICD-10-CM

## 2020-03-18 DIAGNOSIS — M2550 Pain in unspecified joint: Secondary | ICD-10-CM

## 2020-03-18 LAB — LIPID PANEL
Cholesterol: 153 mg/dL (ref 0–200)
HDL: 65 mg/dL (ref >39.00)
LDL Cholesterol: 78 mg/dL (ref 0–99)
NonHDL: 88.47
Total CHOL/HDL Ratio: 2
Triglycerides: 53 mg/dL (ref 0.0–149.0)
VLDL: 10.6 mg/dL (ref 0.0–40.0)

## 2020-03-18 LAB — C-REACTIVE PROTEIN: CRP: <1 mg/dL (ref 0.5–20.0)

## 2020-03-18 LAB — TSH: TSH: 0.19 u[IU]/mL — ABNORMAL LOW (ref 0.35–4.50)

## 2020-03-18 LAB — SEDIMENTATION RATE: Sed Rate: 1 mm/hr (ref 0–30)

## 2020-03-18 NOTE — Addendum Note (Signed)
Addended by: Kelle Darting A on: 03/18/2020 07:36 AM   Modules accepted: Orders

## 2020-03-23 ENCOUNTER — Ambulatory Visit: Payer: BC Managed Care – PPO | Attending: General Surgery | Admitting: Physical Therapy

## 2020-03-23 ENCOUNTER — Encounter: Payer: Self-pay | Admitting: Family Medicine

## 2020-03-23 ENCOUNTER — Ambulatory Visit (HOSPITAL_BASED_OUTPATIENT_CLINIC_OR_DEPARTMENT_OTHER)
Admission: RE | Admit: 2020-03-23 | Discharge: 2020-03-23 | Disposition: A | Discharge status: Home / Self Care | Payer: BC Managed Care – PPO | Source: Ambulatory Visit | Attending: Family Medicine | Admitting: Family Medicine

## 2020-03-23 ENCOUNTER — Other Ambulatory Visit: Payer: Self-pay

## 2020-03-23 ENCOUNTER — Ambulatory Visit (INDEPENDENT_AMBULATORY_CARE_PROVIDER_SITE_OTHER): Payer: BC Managed Care – PPO | Admitting: Family Medicine

## 2020-03-23 ENCOUNTER — Encounter: Payer: Self-pay | Admitting: Physical Therapy

## 2020-03-23 ENCOUNTER — Other Ambulatory Visit (HOSPITAL_COMMUNITY)
Admission: RE | Admit: 2020-03-23 | Discharge: 2020-03-23 | Disposition: A | Discharge status: Home / Self Care | Payer: BC Managed Care – PPO | Source: Ambulatory Visit | Attending: Family Medicine | Admitting: Family Medicine

## 2020-03-23 VITALS — BP 104/66 | HR 65 | Temp 98.0°F | Resp 16 | Ht 67.0 in | Wt 140.0 lb

## 2020-03-23 DIAGNOSIS — R002 Palpitations: Secondary | ICD-10-CM | POA: Diagnosis not present

## 2020-03-23 DIAGNOSIS — Z780 Asymptomatic menopausal state: Secondary | ICD-10-CM

## 2020-03-23 DIAGNOSIS — Z483 Aftercare following surgery for neoplasm: Secondary | ICD-10-CM | POA: Diagnosis not present

## 2020-03-23 DIAGNOSIS — E079 Disorder of thyroid, unspecified: Secondary | ICD-10-CM | POA: Diagnosis not present

## 2020-03-23 DIAGNOSIS — R600 Localized edema: Secondary | ICD-10-CM | POA: Diagnosis not present

## 2020-03-23 DIAGNOSIS — Z1151 Encounter for screening for human papillomavirus (HPV): Secondary | ICD-10-CM | POA: Insufficient documentation

## 2020-03-23 DIAGNOSIS — R079 Chest pain, unspecified: Secondary | ICD-10-CM

## 2020-03-23 DIAGNOSIS — E2839 Other primary ovarian failure: Secondary | ICD-10-CM | POA: Diagnosis not present

## 2020-03-23 DIAGNOSIS — C50412 Malignant neoplasm of upper-outer quadrant of left female breast: Secondary | ICD-10-CM

## 2020-03-23 DIAGNOSIS — Z124 Encounter for screening for malignant neoplasm of cervix: Secondary | ICD-10-CM | POA: Diagnosis not present

## 2020-03-23 DIAGNOSIS — Z0000 Encounter for general adult medical examination without abnormal findings: Secondary | ICD-10-CM | POA: Diagnosis not present

## 2020-03-23 DIAGNOSIS — R0602 Shortness of breath: Secondary | ICD-10-CM

## 2020-03-23 DIAGNOSIS — J449 Chronic obstructive pulmonary disease, unspecified: Secondary | ICD-10-CM | POA: Diagnosis not present

## 2020-03-23 DIAGNOSIS — R0789 Other chest pain: Secondary | ICD-10-CM

## 2020-03-23 DIAGNOSIS — Z170 Estrogen receptor positive status [ER+]: Secondary | ICD-10-CM | POA: Diagnosis not present

## 2020-03-23 DIAGNOSIS — R87610 Atypical squamous cells of undetermined significance on cytologic smear of cervix (ASC-US): Secondary | ICD-10-CM | POA: Insufficient documentation

## 2020-03-23 DIAGNOSIS — R293 Abnormal posture: Secondary | ICD-10-CM | POA: Diagnosis not present

## 2020-03-23 DIAGNOSIS — F319 Bipolar disorder, unspecified: Secondary | ICD-10-CM

## 2020-03-23 DIAGNOSIS — Z8249 Family history of ischemic heart disease and other diseases of the circulatory system: Secondary | ICD-10-CM

## 2020-03-23 NOTE — Progress Notes (Signed)
Patient ID: Cheyenne Garner, female    DOB: 04-26-1965  Age: 55 y.o. MRN: [id number]    Subjective:  Subjective  HPI Cheyenne Garner presents for comprehensive physical exam today. She was diagnosised with left brest cancer on 02/17/2020 by oncologist [name]. She received a CBC differentiatal her platelet level was145. She reports cleaning up her diet, she is currently eating a plant base diet (no dairy or eggs) for lent fasting. She does plan to add dairy back into her diet. She has some concerns about her the maintainance of her estrogen levels by taking Anastrozole or Aromatase inhibitors. The patient has a PMHx of bipolar disorder and anxiety and she also has some concerns about how the medications will interact with the meds.She is considering DIM diet(contains broccoli, cauliflower, cabbage, or brussel sprouts) for treating.She is still doing her research.     She has a PMHx of hashimoto thyroid dx and has been managing with amp thyroid. She  MWF 90 mg of amp thyroid . She was recommended to  change it to MWF 90mg  amp thyroid P.O and 120 mg amp thyroid  P.O. [place] Sat Sun. Her TSH level were still low  so now she is now taking 90 mg amp thyroid P.O. QD.  She complains of SOB and palpitation that has progressively worsened. She endorses having chest tightness while at work due to stress. The patient has a PMHx of anxiety. She was given an Echo and cardiac stress test 15 years ago complaining of similar symptoms. She recently learned that she has a FMhx of descending aortic aneurysm because her older sister being recently dx.   Her mother was recently dx with early stages of dementia, father was recently dx with bladder CA, and younger brother is now undergoing back surgery. She reports that she is comforting them at this time, which can consuming.  She no new complaints at this time. She denies any abdominal pain, chest pain, SOB, fever, chills, cough, HA, dysuria, or urinary incontinence.     Review of Systems  Constitutional: Negative for chills, diaphoresis, fatigue, fever and unexpected weight change.  HENT: Negative for congestion, rhinorrhea, sinus pressure, sinus pain and sore throat.   Eyes: Negative for pain.  Respiratory: Positive for chest tightness and shortness of breath. Negative for cough.   Cardiovascular: Positive for palpitations. Negative for chest pain and leg swelling.  Gastrointestinal: Negative for abdominal pain, blood in stool and nausea.  Genitourinary: Negative for dysuria, flank pain, frequency, vaginal discharge and vaginal pain.  Skin: Negative for rash.  Allergic/Immunologic: Negative for environmental allergies.  Neurological: Negative for dizziness and headaches.  Psychiatric/Behavioral: The patient is nervous/anxious.     History Past Medical History:  Diagnosis Date  . Allergies   . Anxiety   . Asthma    cough variant asthma developed in last couple of years  . Bipolar 1 disorder ([name])   . Cervical cancer screening 09/12/2016  . Colon polyp 09/12/2016  . Family history of bladder cancer   . Family history of breast cancer   . Family history of ovarian cancer   . Family history of prostate cancer   . Hx of colonic polyp 09/12/2016   Colonoscopy 2017 with 1 small polyp, done by Dr [name], repeat colonoscopy in 5 years, 2022  . Hypothyroidism   . Preventative health care 07/23/2015  . Thyroid disease     She has a past surgical history that includes Appendectomy; Skin biopsy; Breast lumpectomy with radioactive  seed localization (Bilateral, 02/12/2020); Re-excision of breast lumpectomy (Left, 03/04/2020); and Sentinel node biopsy (Left, 03/04/2020).   Her family history includes Aneurysm in her sister; Aortic aneurysm (age of onset: 22) in her sister; Arthritis in her brother and mother; Breast cancer (age of onset: 56) in her cousin; Cancer in her paternal grandfather; Cancer (age of onset: 1) in her niece; Cholelithiasis in her sister;  Congestive Heart Failure in her paternal grandmother; Dementia in her maternal grandmother and mother; Heart disease in her father; Hypertension in her father; Macular degeneration in her maternal grandfather; Mental retardation in her sister; Neuropathy in her father; Obesity in her sister; Osteoarthritis in her maternal grandfather and maternal grandmother; Ovarian cancer (age of onset: 67) in her cousin; Prostate cancer in her paternal uncle.She reports that she has never smoked. She has never used smokeless tobacco. She reports current alcohol use. She reports that she does not use drugs.  Current Outpatient Medications on File Prior to Visit  Medication Sig Dispense Refill  . albuterol (VENTOLIN HFA) 108 (90 Base) MCG/ACT inhaler Inhale 2 puffs into the lungs every 6 (six) hours as needed for wheezing or shortness of breath. 1 Inhaler 2  . lithium 300 MG tablet Take 300 mg by mouth 2 (two) times daily.    . Probiotic Product (PROBIOTIC-10 PO) Take 1 capsule by mouth daily.    Marland Kitchen thyroid (ARMOUR) 120 MG tablet Take 120 mg by mouth daily before breakfast. PATIENT TAKES 90MG  ON MON-WED-FRI AND 120MG  ON TUE-THURS-SAT     No current facility-administered medications on file prior to visit.     Objective:  Objective  Physical Exam Vitals and nursing note reviewed.  Constitutional:      General: She is not in acute distress.    Appearance: She is well-developed and well-nourished. She is not diaphoretic.  HENT:     Head: Normocephalic and atraumatic.     Right Ear: Tympanic membrane, ear canal and external ear normal.     Left Ear: Tympanic membrane, ear canal and external ear normal.     Nose: Nose normal.     Mouth/Throat:     Mouth: Oropharynx is clear and moist.  Eyes:     General:        Right eye: No discharge.        Left eye: No discharge.     Extraocular Movements: Extraocular movements intact and EOM normal.     Conjunctiva/sclera: Conjunctivae normal.     Pupils: Pupils are  equal, round, and reactive to light.  Neck:     Thyroid: No thyromegaly.     Vascular: No JVD.  Cardiovascular:     Rate and Rhythm: Normal rate and regular rhythm.     Pulses: Normal pulses and intact distal pulses.          Posterior tibial pulses are 2+ on the right side and 2+ on the left side.     Heart sounds: Normal heart sounds. No murmur heard. No gallop.   Pulmonary:     Effort: Pulmonary effort is normal. No respiratory distress.     Breath sounds: Normal breath sounds. No wheezing or rales.  Chest:     Chest wall: No tenderness.  Abdominal:     General: Bowel sounds are normal. There is no distension.     Palpations: Abdomen is soft. There is no mass.     Tenderness: There is no abdominal tenderness. There is no guarding or rebound.  Genitourinary:  Vagina: Normal. No vaginal discharge.     Cervix: No discharge, erythema or cervical bleeding.     Uterus: Normal, normal. Not enlarged and not tender.      Adnexa: Right adnexa normal and left adnexa normal.     Rectum: Guaiac result negative.  Musculoskeletal:        General: No tenderness or edema. Normal range of motion.     Cervical back: Normal range of motion and neck supple.     Right lower leg: No edema.     Left lower leg: No edema.     Comments: 5/5 strength in UE and LE  Lymphadenopathy:     Cervical: No cervical adenopathy.  Skin:    General: Skin is warm and dry.     Findings: No erythema or rash.  Neurological:     Mental Status: She is alert and oriented to person, place, and time.     Cranial Nerves: No cranial nerve deficit.     Sensory: Sensation is intact.     Motor: Motor function is intact.     Coordination: Coordination is intact.     Gait: Gait is intact.  Psychiatric:        Mood and Affect: Mood and affect normal.        Behavior: Behavior normal.        Thought Content: Thought content normal.        Judgment: Judgment normal.    BP 104/66   Pulse 65   Temp 98 F (36.7 C)    Resp 16   Ht 5\' 7"  (1.702 m)   Wt 140 lb (63.5 kg)   SpO2 92%   BMI 21.93 kg/m  Wt Readings from Last 3 Encounters:  03/23/20 140 lb (63.5 kg)  03/16/20 141 lb 1.6 oz (64 kg)  03/09/20 143 lb (64.9 kg)     Lab Results  Component Value Date   WBC 6.0 03/16/2020   HGB 14.0 03/16/2020   HCT 43.3 03/16/2020   PLT 145 (L) 03/16/2020   GLUCOSE 91 03/16/2020   CHOL 153 03/18/2020   TRIG 53.0 03/18/2020   HDL 65.00 03/18/2020   LDLCALC 78 03/18/2020   ALT 15 03/16/2020   AST 13 (L) 03/16/2020   NA 143 03/16/2020   K 3.7 03/16/2020   CL 105 03/16/2020   CREATININE 0.87 03/16/2020   BUN 19 03/16/2020   CO2 28 03/16/2020   TSH 0.19 (L) 03/18/2020    No results found.   Assessment & Plan:  Plan    No orders of the defined types were placed in this encounter.   Problem List Items Addressed This Visit    Thyroid disease    Is following with Dr Cassie Freer of endocrinology. She is considering switching her care here. She changed to NP Thyroid 90 every day instead of alternating with the 120 dose last week after her tsh was suppressed at 0.19      Relevant Orders   TSH   T3, free   Bipolar 1 disorder ([place])    Has been stable for years. Is worried about the stress of this flaring but so far that has not happened.       Preventative health care    Patient encouraged to maintain heart healthy diet, regular exercise, adequate sleep. Consider daily probiotics. Take medications as prescribed. Labs reviewed. Pap today. Following with oncology for mgm. Dexa ordered today      Cervical cancer screening - Primary  Relevant Orders   Cytology - PAP( [place])   Breast cancer in female [facility])    Left is recent diagnosis is following with Dr Marlou Starks the surgeon and Dr Jana Hakim of oncology. Oncology is recommending Tamoxifen vs Aromatase inhibitor. She is unclear which one to use. Bilateral lumpectomies.       FH: thoracic aortic aneurysm    Sister recently diagnosed incidentally  after an accident with 4.3 cm descending aortic aneurysm and they have recommended all family members obtain CT chest. CXR ordered today and CT chest also. She is also experiencing some random episodes of SOB, palpitations and chest tightness not necessarily simultaneously and she often feels they are stress related but will obtain cardiology consultation for further evaluation      Relevant Orders   CT Angio Chest W/Cm &/Or Wo Cm    Other Visit Diagnoses    Estrogen deficiency       Relevant Orders   DG Bone Density   Post-menopausal       Relevant Orders   DG Bone Density   SOB (shortness of breath)       Relevant Orders   Ambulatory referral to Cardiology   CT Angio Chest W/Cm &/Or Wo Cm   DG Chest 2 View   Palpitation       Relevant Orders   Ambulatory referral to Cardiology   CT Angio Chest W/Cm &/Or Wo Cm   DG Chest 2 View   Chest tightness       Relevant Orders   Ambulatory referral to Cardiology   CT Angio Chest W/Cm &/Or Wo Cm   DG Chest 2 View   Chest pain, unspecified type       Relevant Orders   CT Angio Chest W/Cm &/Or Wo Cm      Follow-up: Return in about 6 months (around 09/23/2020), or 3 mn lab appt, 6 mn f/u.   I,Alexis Bryant,acting as a Education administrator for Penni Homans, MD.,have documented all relevant documentation on the behalf of Penni Homans, MD,as directed by  Penni Homans, MD while in the presence of Penni Homans, MD.  I, Penni Homans, MD, have reviewed all documentation for this visit. The documentation on 03/23/20 for the exam, diagnosis, procedures, and orders are all accurate and complete.

## 2020-03-23 NOTE — Assessment & Plan Note (Addendum)
Is following with Dr Cassie Freer of endocrinology. She is considering switching her care here. She changed to NP Thyroid 90 every day instead of alternating with the 120 dose last week after her tsh was suppressed at 0.19

## 2020-03-23 NOTE — Assessment & Plan Note (Signed)
Has been stable for years. Is worried about the stress of this flaring but so far that has not happened.

## 2020-03-23 NOTE — Addendum Note (Signed)
Addended by: Randolm Idol A on: 03/23/2020 12:03 PM   Modules accepted: Orders

## 2020-03-23 NOTE — Assessment & Plan Note (Signed)
Sister recently diagnosed incidentally after an accident with 4.3 cm descending aortic aneurysm and they have recommended all family members obtain CT chest. CXR ordered today and CT chest also. She is also experiencing some random episodes of SOB, palpitations and chest tightness not necessarily simultaneously and she often feels they are stress related but will obtain cardiology consultation for further evaluation

## 2020-03-23 NOTE — Patient Instructions (Addendum)
Shingrix is the new shingles shot confirm coverage and where they pay best and make to receive Preventive Care 80-55 Years Old, Female Preventive care refers to lifestyle choices and visits with your health care provider that can promote health and wellness. This includes:  A yearly physical exam. This is also called an annual wellness visit.  Regular dental and eye exams.  Immunizations.  Screening for certain conditions.  Healthy lifestyle choices, such as: ? Eating a healthy diet. ? Getting regular exercise. ? Not using drugs or products that contain nicotine and tobacco. ? Limiting alcohol use. What can I expect for my preventive care visit? Physical exam Your health care provider will check your:  Height and weight. These may be used to calculate your BMI (body mass index). BMI is a measurement that tells if you are at a healthy weight.  Heart rate and blood pressure.  Body temperature.  Skin for abnormal spots. Counseling Your health care provider may ask you questions about your:  Past medical problems.  Family's medical history.  Alcohol, tobacco, and drug use.  Emotional well-being.  Home life and relationship well-being.  Sexual activity.  Diet, exercise, and sleep habits.  Work and work Statistician.  Access to firearms.  Method of birth control.  Menstrual cycle.  Pregnancy history. What immunizations do I need? Vaccines are usually given at various ages, according to a schedule. Your health care provider will recommend vaccines for you based on your age, medical history, and lifestyle or other factors, such as travel or where you work.   What tests do I need? Blood tests  Lipid and cholesterol levels. These may be checked every 5 years, or more often if you are over 55 years old.  Hepatitis C test.  Hepatitis B test. Screening  Lung cancer screening. You may have this screening every year starting at age 21 if you have a 30-pack-year  history of smoking and currently smoke or have quit within the past 15 years.  Colorectal cancer screening. ? All adults should have this screening starting at age 55 and continuing until age 36. ? Your health care provider may recommend screening at age 55 if you are at increased risk. ? You will have tests every 1-10 years, depending on your results and the type of screening test.  Diabetes screening. ? This is done by checking your blood sugar (glucose) after you have not eaten for a while (fasting). ? You may have this done every 1-3 years.  Mammogram. ? This may be done every 1-2 years. ? Talk with your health care provider about when you should start having regular mammograms. This may depend on whether you have a family history of breast cancer.  BRCA-related cancer screening. This may be done if you have a family history of breast, ovarian, tubal, or peritoneal cancers.  Pelvic exam and Pap test. ? This may be done every 3 years starting at age 55. ? Starting at age 55, this may be done every 5 years if you have a Pap test in combination with an HPV test. Other tests  STD (sexually transmitted disease) testing, if you are at risk.  Bone density scan. This is done to screen for osteoporosis. You may have this scan if you are at high risk for osteoporosis. Talk with your health care provider about your test results, treatment options, and if necessary, the need for more tests. Follow these instructions at home: Eating and drinking  Eat a diet that includes fresh  fruits and vegetables, whole grains, lean protein, and low-fat dairy products.  Take vitamin and mineral supplements as recommended by your health care provider.  Do not drink alcohol if: ? Your health care provider tells you not to drink. ? You are pregnant, may be pregnant, or are planning to become pregnant.  If you drink alcohol: ? Limit how much you have to 0-1 drink a day. ? Be aware of how much alcohol is  in your drink. In the [place] one drink equals one 12 oz bottle of beer (355 mL), one 5 oz glass of wine (148 mL), or one 1 oz glass of hard liquor (44 mL).   Lifestyle  Take daily care of your teeth and gums. Brush your teeth every morning and night with fluoride toothpaste. Floss one time each day.  Stay active. Exercise for at least 30 minutes 5 or more days each week.  Do not use any products that contain nicotine or tobacco, such as cigarettes, e-cigarettes, and chewing tobacco. If you need help quitting, ask your health care provider.  Do not use drugs.  If you are sexually active, practice safe sex. Use a condom or other form of protection to prevent STIs (sexually transmitted infections).  If you do not wish to become pregnant, use a form of birth control. If you plan to become pregnant, see your health care provider for a prepregnancy visit.  If told by your health care provider, take low-dose aspirin daily starting at age 55.  Find healthy ways to cope with stress, such as: ? Meditation, yoga, or listening to music. ? Journaling. ? Talking to a trusted person. ? Spending time with friends and family. Safety  Always wear your seat belt while driving or riding in a vehicle.  Do not drive: ? If you have been drinking alcohol. Do not ride with someone who has been drinking. ? When you are tired or distracted. ? While texting.  Wear a helmet and other protective equipment during sports activities.  If you have firearms in your house, make sure you follow all gun safety procedures. What's next?  Visit your health care provider once a year for an annual wellness visit.  Ask your health care provider how often you should have your eyes and teeth checked.  Stay up to date on all vaccines. This information is not intended to replace advice given to you by your health care provider. Make sure you discuss any questions you have with your health care provider. Document Revised:  10/07/2019 Document Reviewed: 09/13/2017 Elsevier Patient Education  2021 [employer].

## 2020-03-23 NOTE — Assessment & Plan Note (Addendum)
Left is recent diagnosis is following with Dr Marlou Starks the surgeon and Dr Jana Hakim of oncology. Oncology is recommending Tamoxifen vs Aromatase inhibitor. She is unclear which one to use. Bilateral lumpectomies.

## 2020-03-23 NOTE — Therapy (Addendum)
[place], [place], [address] Phone: [phone]   Fax:  [phone]  Physical Therapy Treatment  Patient Details  Name: Cheyenne Garner MRN: [id number] Date of Birth: May 13, 1965 Referring Provider (PT): Joaquin Courts Date: 03/23/2020   PT End of Session - 03/23/20 1448    Visit Number 2    Number of Visits 10    Date for PT Re-Evaluation 04/20/20    PT Start Time 1406    PT Stop Time 1448    PT Time Calculation (min) 42 min    Activity Tolerance Patient tolerated treatment well    Behavior During Therapy [facility] for tasks assessed/performed           Past Medical History:  Diagnosis Date  . Allergies   . Anxiety   . Asthma    cough variant asthma developed in last couple of years  . Bipolar 1 disorder ([place])   . Cervical cancer screening 09/12/2016  . Colon polyp 09/12/2016  . Family history of bladder cancer   . Family history of breast cancer   . Family history of ovarian cancer   . Family history of prostate cancer   . Hx of colonic polyp 09/12/2016   Colonoscopy 2017 with 1 small polyp, done by Dr [name], repeat colonoscopy in 5 years, 2022  . Hypothyroidism   . Preventative health care 07/23/2015  . Thyroid disease     Past Surgical History:  Procedure Laterality Date  . APPENDECTOMY    . BREAST LUMPECTOMY WITH RADIOACTIVE SEED LOCALIZATION Bilateral 02/12/2020   Procedure: BILATERAL BREAST LUMPECTOMY WITH RADIOACTIVE SEED LOCALIZATION;  Surgeon: Jovita Kussmaul, MD;  Location: [place];  Service: General;  Laterality: Bilateral;  . RE-EXCISION OF BREAST LUMPECTOMY Left 03/04/2020   Procedure: LEFT BREAST MARGIN REEXCISION;  Surgeon: Jovita Kussmaul, MD;  Location: [place];  Service: General;  Laterality: Left;  . SENTINEL NODE BIOPSY Left 03/04/2020   Procedure: SENTINEL NODE BIOPSY;  Surgeon: Jovita Kussmaul, MD;  Location: [place];   Service: General;  Laterality: Left;  . SKIN BIOPSY     back moles removed (precancer?)    There were no vitals filed for this visit.   Subjective Assessment - 03/23/20 1407    Subjective I have my appt with radiation tomorrow. My ROM is completely normal.    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 No/denies    Pain Score 0-No pain              [facility] PT Assessment - 03/23/20 0001      Observation/Other Assessments   Observations tightness palpable in L axilla and in area      AROM   Left Shoulder Flexion 175 Degrees    Left Shoulder ABduction 180 Degrees    Left Shoulder Internal Rotation 70 Degrees    Left Shoulder External Rotation 90 Degrees             LYMPHEDEMA/ONCOLOGY QUESTIONNAIRE - 03/23/20 0001      Left Upper Extremity Lymphedema   15 cm Proximal to Olecranon Process 28.9 cm  Olecranon Process 25 cm    15 cm Proximal to Ulnar Styloid Process 22.7 cm    Just Proximal to Ulnar Styloid Process 15.5 cm    Across Hand at [employer] 19.1 cm    At [place] of 2nd Digit 6.4 cm                                   PT Long Term Goals - 03/23/20 1453      PT LONG TERM GOAL #1   Title Pt will return to baseline shoulder ROM measurements and not demonstrate any signs or symptoms of lymphedema.    Time 4    Period Weeks    Status Achieved      PT LONG TERM GOAL #2   Title Pt will report a 75% improvement in discomfort just inferior to L axilla in area of serratus to allow improved comfort.    Time 4    Period Weeks    Status New    Target Date 04/20/20      PT LONG TERM GOAL #3   Title Pt will demonstrate a 50% improvement in scar tissue in area  of bilateral lumpectomies to allow improved comfort.    Time 4    Period Weeks    Status New    Target Date 04/20/20                 Plan - 03/23/20 1448    Clinical Impression Statement Pt returns to PT after reexcision and SLNB on L approximately 3 weeks ago. Her ROM has returned to baesline but pt does demonstrate considerable scar tissue in area of R lumpectomy and L lumpectomy. She also has tenderness in these areas. Pt has muscle tightness in L serratus muscle with tenderness to the touch. She demonstrates some post surgical edema bilaterally. She reports she had a pain in her left breast and when she looked she had a small bruise and one of her veins was slightly distended. Pt reports the discomfort in this area is improving as so is the bruise. Educated pt to inbasket her Psychologist, sport and exercise with a picture to see if she needed to be seen and pt reports she will do that. Pt would benefit from skilled PT services to decrease scar tissue and muscle tightness. Issued pt script today to obtain a prophylactic compression sleeve and gauntlet as well as a compression bra to help with her post surgical edema. Pt will be beginning radiation at the end of the month.    PT Frequency 2x / week    PT Duration 4 weeks    PT Treatment/Interventions ADLs/Self Care Home Management;Patient/family education;Therapeutic exercise;Manual lymph drainage;Manual techniques;Scar mobilization    PT Next Visit Plan begin STM to serratus on L, scar massage to R and L lumpectomy scars (might need to hold off a week on L depending on healing)    PT Home Exercise Plan post op shoulder ROM    Consulted and Agree with Plan of Care Patient           Patient will benefit from skilled therapeutic intervention in order to improve the following deficits and impairments:  Postural dysfunction,Increased fascial restricitons,Decreased scar mobility,Increased edema  Visit Diagnosis: Aftercare following surgery for  neoplasm  Localized edema  Abnormal posture  Malignant neoplasm of upper-outer quadrant of left breast in female, estrogen receptor positive ([place])     Problem List  Patient Active Problem List   Diagnosis Date Noted  . FH: thoracic aortic aneurysm 03/23/2020  . Family history of breast cancer   . Family history of ovarian cancer   . Family history of bladder cancer   . Family history of prostate cancer   . Breast cancer in female [facility]) 03/09/2020  . Bilateral dry eyes 01/27/2019  . Cervical cancer screening 09/12/2016  . Hx of colonic polyp 09/12/2016  . Preventative health care 07/23/2015  . Asthma   . Thyroid disease   . Bipolar 1 disorder [facility])     Allyson Sabal [facility] 03/23/2020, 2:56 PM  [place], [place], [address] Phone: [phone]   Fax:  [phone]  Name: DALIAH CHAUDOIN MRN: [id number] Date of Birth: 25-Jun-1965  Manus Gunning, PT 03/23/20 3:04 PM

## 2020-03-23 NOTE — Assessment & Plan Note (Addendum)
Patient encouraged to maintain heart healthy diet, regular exercise, adequate sleep. Consider daily probiotics. Take medications as prescribed. Labs reviewed. Pap today. Following with oncology for mgm. Dexa ordered today

## 2020-03-23 NOTE — Patient Instructions (Signed)
First of all, check with your insurance company to see if provider is in network    [facility] (for wigs and compression sleeves / gloves/gauntlets )  [address] [place], [place] [address] [phone]  Will file some insurances --- call for appointment   [facility] (for mastectomy prosthetics and garments) [address] [place], [place] [address] [phone] Will file some insurances --- call for appointment  [facility]  [address]  [place], [place] [address] [phone] Lower extremity garments  [facility] [address] [place], [place]  [address] [phone]   [facility] (for compression garments, especilly for lower extremities) [address] [place], [place]  [address] [phone] Call for appointment    Melissa Meares ,certified fitter [facility]  [phone]  [facility] (for mastectomy supplies and garments) [address] [place], [place] [address] [phone]  Other Resources: National Lymphedema Network:  www.lymphnet.org www.[employer].com for patient articles and self manual lymph drainage information www.lymphedemablog.com has informative articles.  www.compressionguru.com www.lymphedemaproducts.com www.brightlifedirect.com 

## 2020-03-24 ENCOUNTER — Ambulatory Visit
Admission: RE | Admit: 2020-03-24 | Discharge: 2020-03-24 | Disposition: A | Discharge status: Home / Self Care | Payer: BC Managed Care – PPO | Source: Ambulatory Visit | Attending: Radiation Oncology | Admitting: Radiation Oncology

## 2020-03-24 ENCOUNTER — Other Ambulatory Visit: Payer: Self-pay

## 2020-03-24 ENCOUNTER — Encounter: Payer: BC Managed Care – PPO | Admitting: Physical Therapy

## 2020-03-24 ENCOUNTER — Telehealth: Payer: Self-pay | Admitting: *Deleted

## 2020-03-24 DIAGNOSIS — C50412 Malignant neoplasm of upper-outer quadrant of left female breast: Secondary | ICD-10-CM | POA: Insufficient documentation

## 2020-03-24 DIAGNOSIS — Z510 Encounter for antineoplastic radiation therapy: Secondary | ICD-10-CM | POA: Diagnosis not present

## 2020-03-24 DIAGNOSIS — Z170 Estrogen receptor positive status [ER+]: Secondary | ICD-10-CM | POA: Diagnosis not present

## 2020-03-24 DIAGNOSIS — C50912 Malignant neoplasm of unspecified site of left female breast: Secondary | ICD-10-CM

## 2020-03-24 DIAGNOSIS — C50911 Malignant neoplasm of unspecified site of right female breast: Secondary | ICD-10-CM

## 2020-03-24 MED ORDER — RADIAPLEXRX EX GEL: Freq: Once | CUTANEOUS | Status: AC

## 2020-03-24 MED ORDER — ALRA NON-METALLIC DEODORANT (RAD-ONC)
1.0000 "application " | Freq: Once | TOPICAL | Status: AC
  Administered 2020-03-24: 1 via TOPICAL

## 2020-03-24 NOTE — Telephone Encounter (Signed)
"  LINDAANN GRADILLA [phone]), calling for FMLA information.  Would like provider address, phone, fax to apply for intermittent FMLA.  I work one 12-hour day shift per week or 0.3  FTE and as needed on other units.  Radiation expected 04/05/2020 through April 15,2022 and need a week after to rest with advise I'll be most tired the week after completing radiation."    Provided requested information above as well as this nurse direct phone, fax and forms e-mail address ([email or web address]) to send expected form.  Advised end date six months out from radiation start date with intermittent; unaware of treatment response OR  submit new request in April if further leave time needed. Currently no further questions or needs.

## 2020-03-24 NOTE — Progress Notes (Signed)
Pt here for patient teaching.    Pt given Radiation and You booklet, skin care instructions, Alra deodorant and Radiaplex gel.    Reviewed areas of pertinence such as fatigue, hair loss, skin changes, breast tenderness and breast swelling .   Pt able to give teach back of to pat skin and use unscented/gentle soap,apply Radiaplex bid, avoid applying anything to skin within 4 hours of treatment, avoid wearing an under wire bra and to use an electric razor if they must shave.   Pt demonstrated understanding of information given and will contact nursing with any questions or concerns.    Http://rtanswers.org/treatmentinformation/whattoexpect/index         

## 2020-03-26 LAB — CYTOLOGY - PAP
Comment: NEGATIVE
Diagnosis: UNDETERMINED — AB
High risk HPV: NEGATIVE

## 2020-03-30 ENCOUNTER — Encounter: Payer: Self-pay | Admitting: *Deleted

## 2020-03-31 ENCOUNTER — Ambulatory Visit: Payer: BC Managed Care – PPO | Admitting: Physical Therapy

## 2020-03-31 ENCOUNTER — Other Ambulatory Visit: Payer: Self-pay | Admitting: Family Medicine

## 2020-03-31 ENCOUNTER — Other Ambulatory Visit: Payer: Self-pay

## 2020-03-31 ENCOUNTER — Encounter: Payer: Self-pay | Admitting: Physical Therapy

## 2020-03-31 DIAGNOSIS — Z510 Encounter for antineoplastic radiation therapy: Secondary | ICD-10-CM | POA: Diagnosis not present

## 2020-03-31 DIAGNOSIS — R293 Abnormal posture: Secondary | ICD-10-CM | POA: Diagnosis not present

## 2020-03-31 DIAGNOSIS — Z170 Estrogen receptor positive status [ER+]: Secondary | ICD-10-CM | POA: Diagnosis not present

## 2020-03-31 DIAGNOSIS — R600 Localized edema: Secondary | ICD-10-CM | POA: Diagnosis not present

## 2020-03-31 DIAGNOSIS — R87610 Atypical squamous cells of undetermined significance on cytologic smear of cervix (ASC-US): Secondary | ICD-10-CM

## 2020-03-31 DIAGNOSIS — C50412 Malignant neoplasm of upper-outer quadrant of left female breast: Secondary | ICD-10-CM | POA: Diagnosis not present

## 2020-03-31 DIAGNOSIS — Z483 Aftercare following surgery for neoplasm: Secondary | ICD-10-CM

## 2020-03-31 NOTE — Therapy (Signed)
[facility] [address] [place], [place], [address] Phone: [phone]   Fax:  [phone]  Physical Therapy Treatment  Patient Details  Name: Cheyenne Garner MRN: [id number] Date of Birth: 04/01/1965 Referring Provider (PT): Toth   Encounter Date: 03/31/2020   PT End of Session - 03/31/20 1207    Visit Number 3    Number of Visits 10    Date for PT Re-Evaluation 04/20/20    PT Start Time 1111   pt arrived late   PT Stop Time 1200    PT Time Calculation (min) 49 min    Activity Tolerance Patient tolerated treatment well    Behavior During Therapy WFL for tasks assessed/performed           Past Medical History:  Diagnosis Date  . Allergies   . Anxiety   . Asthma    cough variant asthma developed in last couple of years  . Bipolar 1 disorder ([facility])   . Cervical cancer screening 09/12/2016  . Colon polyp 09/12/2016  . Family history of bladder cancer   . Family history of breast cancer   . Family history of ovarian cancer   . Family history of prostate cancer   . Hx of colonic polyp 09/12/2016   Colonoscopy 2017 with 1 small polyp, done by Dr Outlaw, repeat colonoscopy in 5 years, 2022  . Hypothyroidism   . Preventative health care 07/23/2015  . Thyroid disease     Past Surgical History:  Procedure Laterality Date  . APPENDECTOMY    . BREAST LUMPECTOMY WITH RADIOACTIVE SEED LOCALIZATION Bilateral 02/12/2020   Procedure: BILATERAL BREAST LUMPECTOMY WITH RADIOACTIVE SEED LOCALIZATION;  Surgeon: Toth, Paul III, MD;  Location: [facility];  Service: General;  Laterality: Bilateral;  . RE-EXCISION OF BREAST LUMPECTOMY Left 03/04/2020   Procedure: LEFT BREAST MARGIN REEXCISION;  Surgeon: Toth, Paul III, MD;  Location: [facility];  Service: General;  Laterality: Left;  . SENTINEL NODE BIOPSY Left 03/04/2020   Procedure: SENTINEL NODE BIOPSY;  Surgeon: Toth, Paul III, MD;  [facility];  Service: General;  Laterality: Left;  . SKIN BIOPSY     back moles removed (precancer?)    There were no vitals filed for this visit.   Subjective Assessment - 03/31/20 1112    Subjective That tightness is a lot better today.    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 No/denies    Pain Score 0-No pain                             [facility] Adult PT Treatment/Exercise - 03/31/20 0001      Manual Therapy   Manual Therapy Soft tissue mobilization;Myofascial release    Soft tissue mobilization to bilateral breast in area of scar tissue to help soften tissue - scar tissue softened signficantly especially in R breast    Myofascial Release gently to lumpectomy scars on bilateral breasts                       PT Long Term Goals -   03/23/20 1453      PT LONG TERM GOAL #1   Title Pt will return to baseline shoulder ROM measurements and not demonstrate any signs or symptoms of lymphedema.    Time 4    Period Weeks    Status Achieved      PT LONG TERM GOAL #2   Title Pt will report a 75% improvement in discomfort just inferior to L axilla in area of serratus to allow improved comfort.    Time 4    Period Weeks    Status New    Target Date 04/20/20      PT LONG TERM GOAL #3   Title Pt will demonstrate a 50% improvement in scar tissue in area of bilateral lumpectomies to allow improved comfort.    Time 4    Period Weeks    Status New    Target Date 04/20/20                 Plan - 03/31/20 1208    Clinical Impression Statement Began soft tissue mobilization and myofascial release to scar tissue surrounding lumpectomy scars and also to area in R  breast where pt previously had a hematoma. Much improvement noted by end of session and R breast much softer. Will continue with this and instructing pt in self massage.    PT Frequency 2x / week    PT Duration 4 weeks    PT Treatment/Interventions ADLs/Self Care Home Management;Patient/family education;Therapeutic exercise;Manual lymph drainage;Manual techniques;Scar mobilization    PT Next Visit Plan cont STM to serratus on L, scar massage to R and L lumpectomy scars    PT Home Exercise Plan post op shoulder ROM    Consulted and Agree with Plan of Care Patient           Patient will benefit from skilled therapeutic intervention in order to improve the following deficits and impairments:  Postural dysfunction,Increased fascial restricitons,Decreased scar mobility,Increased edema  Visit Diagnosis: Aftercare following surgery for neoplasm     Problem List Patient Active Problem List   Diagnosis Date Noted  . FH: thoracic aortic aneurysm 03/23/2020  . Family history of breast cancer   . Family history of ovarian cancer   . Family history of bladder cancer   . Family history of prostate cancer   . Breast cancer in female [facility]) 03/09/2020  . Bilateral dry eyes 01/27/2019  . Cervical cancer screening 09/12/2016  . Hx of colonic polyp 09/12/2016  . Preventative health care 07/23/2015  . Asthma   . Thyroid disease   . Bipolar 1 disorder [facility])     Allyson Sabal [facility] 03/31/2020, 12:11 PM  [place], [place], [address] Phone: [phone]   Fax:  [phone]  Name: Cheyenne Garner MRN: [id number] Date of Birth: August 16, 1965  Manus Gunning, PT 03/31/20 12:11 PM

## 2020-04-01 ENCOUNTER — Encounter (HOSPITAL_BASED_OUTPATIENT_CLINIC_OR_DEPARTMENT_OTHER): Payer: Self-pay

## 2020-04-01 ENCOUNTER — Ambulatory Visit (HOSPITAL_BASED_OUTPATIENT_CLINIC_OR_DEPARTMENT_OTHER)
Admission: RE | Admit: 2020-04-01 | Discharge: 2020-04-01 | Disposition: A | Discharge status: Home / Self Care | Payer: BC Managed Care – PPO | Source: Ambulatory Visit | Attending: Family Medicine | Admitting: Family Medicine

## 2020-04-01 DIAGNOSIS — R079 Chest pain, unspecified: Secondary | ICD-10-CM

## 2020-04-01 DIAGNOSIS — R0602 Shortness of breath: Secondary | ICD-10-CM | POA: Diagnosis not present

## 2020-04-01 DIAGNOSIS — E2839 Other primary ovarian failure: Secondary | ICD-10-CM | POA: Insufficient documentation

## 2020-04-01 DIAGNOSIS — Z8249 Family history of ischemic heart disease and other diseases of the circulatory system: Secondary | ICD-10-CM | POA: Insufficient documentation

## 2020-04-01 DIAGNOSIS — Z780 Asymptomatic menopausal state: Secondary | ICD-10-CM | POA: Diagnosis not present

## 2020-04-01 DIAGNOSIS — R0789 Other chest pain: Secondary | ICD-10-CM | POA: Insufficient documentation

## 2020-04-01 DIAGNOSIS — R002 Palpitations: Secondary | ICD-10-CM | POA: Insufficient documentation

## 2020-04-01 DIAGNOSIS — M8589 Other specified disorders of bone density and structure, multiple sites: Secondary | ICD-10-CM | POA: Diagnosis not present

## 2020-04-01 MED ORDER — IOHEXOL 350 MG/ML SOLN: 100.0000 mL | Freq: Once | INTRAVENOUS | Status: DC | PRN

## 2020-04-02 ENCOUNTER — Other Ambulatory Visit: Payer: Self-pay

## 2020-04-02 ENCOUNTER — Ambulatory Visit: Payer: BC Managed Care – PPO

## 2020-04-02 DIAGNOSIS — R293 Abnormal posture: Secondary | ICD-10-CM | POA: Diagnosis not present

## 2020-04-02 DIAGNOSIS — C50412 Malignant neoplasm of upper-outer quadrant of left female breast: Secondary | ICD-10-CM

## 2020-04-02 DIAGNOSIS — R600 Localized edema: Secondary | ICD-10-CM | POA: Diagnosis not present

## 2020-04-02 DIAGNOSIS — Z483 Aftercare following surgery for neoplasm: Secondary | ICD-10-CM | POA: Diagnosis not present

## 2020-04-02 DIAGNOSIS — Z170 Estrogen receptor positive status [ER+]: Secondary | ICD-10-CM

## 2020-04-02 NOTE — Therapy (Signed)
[place], [place], [address] Phone: [phone]   Fax:  [phone]  Physical Therapy Treatment  Patient Details  Name: Cheyenne Garner MRN: [id number] Date of Birth: 03-06-1965 Referring Provider (PT): Joaquin Courts Date: 04/02/2020   PT End of Session - 04/02/20 6948    Visit Number 4    Number of Visits 10    Date for PT Re-Evaluation 04/20/20    PT Start Time 1110    PT Stop Time 1212    PT Time Calculation (min) 62 min    Activity Tolerance Patient tolerated treatment well    Behavior During Therapy [facility] for tasks assessed/performed           Past Medical History:  Diagnosis Date  . Allergies   . Anxiety   . Asthma    cough variant asthma developed in last couple of years  . Bipolar 1 disorder ([place])   . Cervical cancer screening 09/12/2016  . Colon polyp 09/12/2016  . Family history of bladder cancer   . Family history of breast cancer   . Family history of ovarian cancer   . Family history of prostate cancer   . Hx of colonic polyp 09/12/2016   Colonoscopy 2017 with 1 small polyp, done by Dr [name], repeat colonoscopy in 5 years, 2022  . Hypothyroidism   . Preventative health care 07/23/2015  . Thyroid disease     Past Surgical History:  Procedure Laterality Date  . APPENDECTOMY    . BREAST LUMPECTOMY WITH RADIOACTIVE SEED LOCALIZATION Bilateral 02/12/2020   Procedure: BILATERAL BREAST LUMPECTOMY WITH RADIOACTIVE SEED LOCALIZATION;  Surgeon: Jovita Kussmaul, MD;  Location: [place];  Service: General;  Laterality: Bilateral;  . RE-EXCISION OF BREAST LUMPECTOMY Left 03/04/2020   Procedure: LEFT BREAST MARGIN REEXCISION;  Surgeon: Jovita Kussmaul, MD;  Location: [place];  Service: General;  Laterality: Left;  . SENTINEL NODE BIOPSY Left 03/04/2020   Procedure: SENTINEL NODE BIOPSY;  Surgeon: Jovita Kussmaul, MD;  Location: [place];   Service: General;  Laterality: Left;  . SKIN BIOPSY     back moles removed (precancer?)    There were no vitals filed for this visit.   Subjective Assessment - 04/02/20 1114    Subjective I feel so much better after my last session here. Blaire got the scar tissue loosened up and it feels so much better now.    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 No/denies                             [facility] Adult PT Treatment/Exercise - 04/02/20 0001      Self-Care   Self-Care Other Self-Care Comments    Other Self-Care Comments  Spent beginning of session answering pts questions within scope of practice of what she can possibly expect of radiation in reagrds to her breast tissue and possible changes. Also answered questiosn regarding lymphedema isk reductions that she had from the ABC class. Pt reports good understanding of all discussed.      Manual Therapy  Manual Therapy Soft tissue mobilization;Myofascial release    Soft tissue mobilization to bilateral breast in area of scar tissue to help soften tissue - scar tissue softened signficantly especially in R breast    Myofascial Release gently to lumpectomy scars on bilateral breasts                       PT Long Term Goals - 03/23/20 1453      PT LONG TERM GOAL #1   Title Pt will return to baseline shoulder ROM measurements and not demonstrate any signs or symptoms of lymphedema.    Time 4    Period Weeks    Status Achieved      PT LONG TERM GOAL #2   Title Pt will report a 75% improvement in discomfort just inferior to L axilla in area of serratus to allow improved comfort.    Time 4    Period Weeks    Status New     Target Date 04/20/20      PT LONG TERM GOAL #3   Title Pt will demonstrate a 50% improvement in scar tissue in area of bilateral lumpectomies to allow improved comfort.    Time 4    Period Weeks    Status New    Target Date 04/20/20                 Plan - 04/02/20 1235    Clinical Impression Statement Spent beginning of session answering pts questions regarding lymphedema risk reductions and upcoming radiation and how she may notice tightening of breast tissue by end of radiation, importance of streching daily throughout radiation and, once she gets compression bra that it will be beneficial to wear this daily as well to help limit increased swelling that could occur. Continued STM as done at last session as pt reported great benefit of that. By end of session pt reports continuing to feel improvement and feels confident with self massage at home. She wanted to decrease her frequency to 1x/wk for next 1-2 weeks so schedule was adjusted to this.    Stability/Clinical Decision Making Stable/Uncomplicated    Rehab Potential Good    PT Frequency 2x / week    PT Duration 4 weeks    PT Treatment/Interventions ADLs/Self Care Home Management;Patient/family education;Therapeutic exercise;Manual lymph drainage;Manual techniques;Scar mobilization    PT Next Visit Plan cont STM to serratus on L, scar massage to R and L lumpectomy scars; pt may want to be placed on hold after next session until after radiation for a final assess as she is doing well    PT Home Exercise Plan post op shoulder ROM; self scar tissue mobs    Consulted and Agree with Plan of Care Patient           Patient will benefit from skilled therapeutic intervention in order to improve the following deficits and impairments:  Postural dysfunction,Increased fascial restricitons,Decreased scar mobility,Increased edema  Visit Diagnosis: Aftercare following surgery for neoplasm  Localized edema  Abnormal posture  Malignant  neoplasm of upper-outer quadrant of left breast in female, estrogen receptor positive ([place])     Problem List Patient Active Problem List   Diagnosis Date Noted  . FH: thoracic aortic aneurysm 03/23/2020  . Family history of breast cancer   . Family history of ovarian cancer   . Family history of bladder cancer   . Family history of prostate cancer   . Breast cancer in female [facility]) 03/09/2020  .  Bilateral dry eyes 01/27/2019  . Cervical cancer screening 09/12/2016  . Hx of colonic polyp 09/12/2016  . Preventative health care 07/23/2015  . Asthma   . Thyroid disease   . Bipolar 1 disorder [facility])     Otelia Limes, PTA 04/02/2020, 12:44 PM  [place], [place], [address] Phone: [phone]   Fax:  [phone]  Name: KERI VEALE MRN: [id number] Date of Birth: 03-22-1965

## 2020-04-05 ENCOUNTER — Telehealth: Payer: Self-pay | Admitting: *Deleted

## 2020-04-05 ENCOUNTER — Ambulatory Visit
Admission: RE | Admit: 2020-04-05 | Discharge: 2020-04-05 | Disposition: A | Discharge status: Home / Self Care | Payer: BC Managed Care – PPO | Source: Ambulatory Visit | Attending: Radiation Oncology | Admitting: Radiation Oncology

## 2020-04-05 ENCOUNTER — Other Ambulatory Visit: Payer: Self-pay

## 2020-04-05 DIAGNOSIS — Z510 Encounter for antineoplastic radiation therapy: Secondary | ICD-10-CM | POA: Diagnosis not present

## 2020-04-05 DIAGNOSIS — Z170 Estrogen receptor positive status [ER+]: Secondary | ICD-10-CM | POA: Diagnosis not present

## 2020-04-05 DIAGNOSIS — C50412 Malignant neoplasm of upper-outer quadrant of left female breast: Secondary | ICD-10-CM | POA: Diagnosis not present

## 2020-04-05 NOTE — Telephone Encounter (Signed)
Received Kem Kays walk-in to office 1-087 with [employer] form.   "Schedule has been changed.  Director recommended FMLA coverage in case I am still unable to work.  Have not worked 1250 hours in the past 12 months to qualify for FMLA is why ADA is needed.  FTE of 0.3 is my status I intend to work.  Ebony Hail PA says since I am so active I may be just fine after completing radiation."  This nurse asked for clarification, work schedule and work intentions having previously received explanation from an employer benefit department "[employer] Accom[employer]ation" means no work, no pay.

## 2020-04-06 ENCOUNTER — Encounter: Payer: Self-pay | Admitting: Radiation Oncology

## 2020-04-06 ENCOUNTER — Other Ambulatory Visit: Payer: Self-pay

## 2020-04-06 ENCOUNTER — Encounter: Payer: Self-pay | Admitting: Physical Therapy

## 2020-04-06 ENCOUNTER — Telehealth: Payer: Self-pay | Admitting: Genetic Counselor

## 2020-04-06 ENCOUNTER — Ambulatory Visit
Admission: RE | Admit: 2020-04-06 | Discharge: 2020-04-06 | Disposition: A | Discharge status: Home / Self Care | Payer: BC Managed Care – PPO | Source: Ambulatory Visit | Attending: Radiation Oncology | Admitting: Radiation Oncology

## 2020-04-06 ENCOUNTER — Ambulatory Visit: Payer: BC Managed Care – PPO | Admitting: Physical Therapy

## 2020-04-06 ENCOUNTER — Encounter: Payer: Self-pay | Admitting: Family Medicine

## 2020-04-06 DIAGNOSIS — R600 Localized edema: Secondary | ICD-10-CM | POA: Diagnosis not present

## 2020-04-06 DIAGNOSIS — R293 Abnormal posture: Secondary | ICD-10-CM | POA: Diagnosis not present

## 2020-04-06 DIAGNOSIS — Z510 Encounter for antineoplastic radiation therapy: Secondary | ICD-10-CM | POA: Diagnosis not present

## 2020-04-06 DIAGNOSIS — Z483 Aftercare following surgery for neoplasm: Secondary | ICD-10-CM | POA: Diagnosis not present

## 2020-04-06 DIAGNOSIS — T7840XA Allergy, unspecified, initial encounter: Secondary | ICD-10-CM | POA: Insufficient documentation

## 2020-04-06 DIAGNOSIS — F419 Anxiety disorder, unspecified: Secondary | ICD-10-CM | POA: Insufficient documentation

## 2020-04-06 DIAGNOSIS — C50412 Malignant neoplasm of upper-outer quadrant of left female breast: Secondary | ICD-10-CM | POA: Diagnosis not present

## 2020-04-06 DIAGNOSIS — Z170 Estrogen receptor positive status [ER+]: Secondary | ICD-10-CM | POA: Diagnosis not present

## 2020-04-06 DIAGNOSIS — E039 Hypothyroidism, unspecified: Secondary | ICD-10-CM | POA: Insufficient documentation

## 2020-04-06 NOTE — Therapy (Signed)
[place], [place], [address] Phone: [phone]   Fax:  [phone]  Physical Therapy Treatment  Patient Details  Name: Cheyenne Garner MRN: [id number] Date of Birth: 1965/08/19 Referring Provider (PT): Joaquin Courts Date: 04/06/2020   PT End of Session - 04/06/20 1401    Visit Number 5    Number of Visits 10    Date for PT Re-Evaluation 04/20/20    PT Start Time 0962   pt arrived late   PT Stop Time 1356    PT Time Calculation (min) 44 min    Activity Tolerance Patient tolerated treatment well    Behavior During Therapy [facility] for tasks assessed/performed           Past Medical History:  Diagnosis Date  . Allergies   . Anxiety   . Asthma    cough variant asthma developed in last couple of years  . Bipolar 1 disorder ([place])   . Cervical cancer screening 09/12/2016  . Colon polyp 09/12/2016  . Family history of bladder cancer   . Family history of breast cancer   . Family history of ovarian cancer   . Family history of prostate cancer   . Hx of colonic polyp 09/12/2016   Colonoscopy 2017 with 1 small polyp, done by Dr [name], repeat colonoscopy in 5 years, 2022  . Hypothyroidism   . Preventative health care 07/23/2015  . Thyroid disease     Past Surgical History:  Procedure Laterality Date  . APPENDECTOMY    . BREAST LUMPECTOMY WITH RADIOACTIVE SEED LOCALIZATION Bilateral 02/12/2020   Procedure: BILATERAL BREAST LUMPECTOMY WITH RADIOACTIVE SEED LOCALIZATION;  Surgeon: Jovita Kussmaul, MD;  Location: [place];  Service: General;  Laterality: Bilateral;  . RE-EXCISION OF BREAST LUMPECTOMY Left 03/04/2020   Procedure: LEFT BREAST MARGIN REEXCISION;  Surgeon: Jovita Kussmaul, MD;  Location: [place];  Service: General;  Laterality: Left;  . SENTINEL NODE BIOPSY Left 03/04/2020   Procedure: SENTINEL NODE BIOPSY;  Surgeon: Jovita Kussmaul, MD;  Location: [place];  Service: General;  Laterality: Left;  . SKIN BIOPSY     back moles removed (precancer?)    There were no vitals filed for this visit.   Subjective Assessment - 04/06/20 1312    Subjective My first radiation treatment was yesterday and I got blotchy and my nipple was burning.    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 No/denies    Pain Score 0-No pain                             [facility] Adult PT Treatment/Exercise - 04/06/20 0001      Manual Therapy   Soft tissue mobilization to bilateral breast in area of scar tissue to help soften tissue - scar tissue softened signficantly especially in R breast    Myofascial Release gently to lumpectomy scars on bilateral breasts                       PT Long Term Goals - 04/06/20  [place] #1   Title Pt will return to baseline shoulder ROM measurements and not demonstrate any signs or symptoms of lymphedema.    Time 4    Period Weeks    Status Achieved      PT LONG TERM GOAL #2   Title Pt will report a 75% improvement in discomfort just inferior to L axilla in area of serratus to allow improved comfort.    Baseline 04/06/20- 90% improvement    Time 4    Period Weeks    Status Achieved      PT LONG TERM GOAL #3   Title Pt will demonstrate a 50% improvement in scar tissue in area of bilateral lumpectomies to allow improved comfort.    Baseline 04/06/20- 50% improvment in scar tissue    Time 4    Period Weeks    Status Achieved      PT LONG TERM GOAL #4   Title Pt will have full shoulder ROM and not demonstrate any signs of scar tissue or swelling after completion of radiation.    Time 6     Period Weeks    Status New    Target Date 05/18/20                 Plan - 04/06/20 1401    Clinical Impression Statement Assessed pt's progress towards goals in therapy. She has met current goals for therapy. Continued with scar tissue mobilization today which demonstrated improvement by end of session. Will place pt on hold until she completes her radiation and then will reassess her to determine any needs at that time.    PT Frequency 2x / week    PT Duration 4 weeks    PT Treatment/Interventions ADLs/Self Care Home Management;Patient/family education;Therapeutic exercise;Manual lymph drainage;Manual techniques;Scar mobilization    PT Next Visit Plan reassess after radiation- cont STM to serratus on L, scar massage to R and L lumpectomy scars    PT Home Exercise Plan post op shoulder ROM; self scar tissue mobs    Consulted and Agree with Plan of Care Patient           Patient will benefit from skilled therapeutic intervention in order to improve the following deficits and impairments:  Postural dysfunction,Increased fascial restricitons,Decreased scar mobility,Increased edema  Visit Diagnosis: Aftercare following surgery for neoplasm     Problem List Patient Active Problem List   Diagnosis Date Noted  . Hypothyroidism   . Anxiety   . Allergies   . FH: thoracic aortic aneurysm 03/23/2020  . Family history of breast cancer   . Family history of ovarian cancer   . Family history of bladder cancer   . Family history of prostate cancer   . Breast cancer in female [facility]) 03/09/2020  . Bilateral dry eyes 01/27/2019  . Cervical cancer screening 09/12/2016  . Hx of colonic polyp 09/12/2016  . Colon polyp 09/12/2016  . Preventative health care 07/23/2015  . Asthma   . Thyroid disease   . Bipolar affective ([place])   . Urinary incontinence 12/25/2011    Allyson Sabal [facility] 04/06/2020, 2:03 PM  [place], [place], [address] Phone: [phone]   Fax:  [phone]  Name: Cheyenne Garner MRN: [id number] Date of Birth: 06/20/1965  Manus Gunning, PT 04/06/20 2:03 PM

## 2020-04-06 NOTE — Telephone Encounter (Signed)
LVM that her genetic test results are available and requested that she call back to discuss them.  

## 2020-04-06 NOTE — Progress Notes (Signed)
The patient was seen for a work in visit following her second treatment. She has been seen by PT recently and is working with them for softening of scaring from prior biopsy and since surgery. Her skin was more warm to the touch following treatment yesterday and more erythematous than she was expecting. On examination she has some prominent capillary beds through the chest wall but no concerning desquamation that would be typical of radiation induced changes. She admits to anxiety about her situation and it's certainly possible that she had some flushing related to changes in blood flow if she gets anxious. She does have some concerns about the ingredients in her radiaplex gel and we discussed discontinuing radiaplex and switching to aloe vera. She is in agreement and will see Dr. Lisbeth Renshaw later this week. She will contact us if she's having any other concerns prior to her check in with Dr. Lisbeth Renshaw.    Carola Rhine, PAC

## 2020-04-07 ENCOUNTER — Encounter: Payer: Self-pay | Admitting: Cardiology

## 2020-04-07 ENCOUNTER — Encounter: Payer: BC Managed Care – PPO | Admitting: Physical Therapy

## 2020-04-07 ENCOUNTER — Ambulatory Visit
Admission: RE | Admit: 2020-04-07 | Discharge: 2020-04-07 | Disposition: A | Discharge status: Home / Self Care | Payer: BC Managed Care – PPO | Source: Ambulatory Visit | Attending: Radiation Oncology | Admitting: Radiation Oncology

## 2020-04-07 ENCOUNTER — Ambulatory Visit: Payer: BC Managed Care – PPO | Admitting: Cardiology

## 2020-04-07 ENCOUNTER — Ambulatory Visit: Payer: Self-pay | Admitting: Genetic Counselor

## 2020-04-07 ENCOUNTER — Other Ambulatory Visit: Payer: Self-pay

## 2020-04-07 VITALS — BP 110/78 | HR 65 | Ht 66.0 in | Wt 139.0 lb

## 2020-04-07 DIAGNOSIS — R0789 Other chest pain: Secondary | ICD-10-CM

## 2020-04-07 DIAGNOSIS — R0602 Shortness of breath: Secondary | ICD-10-CM

## 2020-04-07 DIAGNOSIS — Z8052 Family history of malignant neoplasm of bladder: Secondary | ICD-10-CM

## 2020-04-07 DIAGNOSIS — C50412 Malignant neoplasm of upper-outer quadrant of left female breast: Secondary | ICD-10-CM | POA: Diagnosis not present

## 2020-04-07 DIAGNOSIS — Z803 Family history of malignant neoplasm of breast: Secondary | ICD-10-CM

## 2020-04-07 DIAGNOSIS — Z170 Estrogen receptor positive status [ER+]: Secondary | ICD-10-CM | POA: Diagnosis not present

## 2020-04-07 DIAGNOSIS — Z8041 Family history of malignant neoplasm of ovary: Secondary | ICD-10-CM

## 2020-04-07 DIAGNOSIS — Z1379 Encounter for other screening for genetic and chromosomal anomalies: Secondary | ICD-10-CM | POA: Insufficient documentation

## 2020-04-07 DIAGNOSIS — Z510 Encounter for antineoplastic radiation therapy: Secondary | ICD-10-CM | POA: Diagnosis not present

## 2020-04-07 DIAGNOSIS — Z8042 Family history of malignant neoplasm of prostate: Secondary | ICD-10-CM

## 2020-04-07 MED ORDER — METOPROLOL TARTRATE 100 MG PO TABS: ORAL_TABLET | ORAL | 0 refills | Status: DC

## 2020-04-07 NOTE — Progress Notes (Signed)
HPI:  Ms. Mabie was previously seen in the [facility] due to a personal and family history of cancer and concerns regarding a hereditary predisposition to cancer. Please refer to our prior cancer genetics clinic note for more information regarding our discussion, assessment and recommendations, at the time. Ms. Hemmer recent genetic test results were disclosed to her, as were recommendations warranted by these results. These results and recommendations are discussed in more detail below.  CANCER HISTORY:  Oncology History  Breast cancer in female [facility])  03/09/2020 Initial Diagnosis   Malignant neoplasm of upper-outer quadrant of left breast in female, estrogen receptor positive (H[facility])   03/09/2020 Cancer Staging   Staging form: Breast, AJCC 8th Edition - Pathologic stage from 03/09/2020: Stage IA (pT1a, pN0, cM0, G2, ER+, PR+, HER2-) - Signed by Lowella Dell, MD on 03/16/2020 Stage prefix: Initial diagnosis Multigene prognostic tests performed: None Histologic grading system: 3 grade system   04/02/2020 Genetic Testing   Negative hereditary cancer genetic testing: no pathogenic variants detected in [facility] CancerNext-Expanded +RNAinsight Panel.  The report date is April 02, 2020.    The CancerNext-Expanded gene panel offered by [facility] and includes sequencing, rearrangement, and RNA analysis for the following 77 genes: AIP, ALK, APC, ATM, AXIN2, BAP1, BARD1, BLM, BMPR1A, BRCA1, BRCA2, BRIP1, CDC73, CDH1, CDK4, CDKN1B, CDKN2A, CHEK2, CTNNA1, DICER1, FANCC, FH, FLCN, GALNT12, KIF1B, LZTR1, MAX, MEN1, MET, MLH1, MSH2, MSH3, MSH6, MUTYH, NBN, NF1, NF2, NTHL1, PALB2, PHOX2B, PMS2, POT1, PRKAR1A, PTCH1, PTEN, RAD51C, RAD51D, RB1, RECQL, RET, SDHA, SDHAF2, SDHB, SDHC, SDHD, SMAD4, SMARCA4, SMARCB1, SMARCE1, STK11, SUFU, TMEM127, TP53, TSC1, TSC2, VHL and XRCC2 (sequencing and deletion/duplication); EGFR, EGLN1, HOXB13, KIT, MITF, PDGFRA, POLD1, and POLE (sequencing only);  EPCAM and GREM1 (deletion/duplication only).      FAMILY HISTORY:  We obtained a detailed, 4-generation family history.  Significant diagnoses are listed below: Family History  Problem Relation Age of Onset  . Cancer Paternal Grandfather        young, lung cancer?, heavy smoker  . Ovarian cancer Cousin 53       maternal second cousin (MGF's great-niece)  . Breast cancer Cousin 63       paternal first cousin  . Prostate cancer Paternal Uncle        spread to bladder, d. 60s/70s  . Cancer Niece 11       soft tissue sarcoma    Ms. Hannay has two sons (ages 21 and 54). She has two brothers (ages 23 and 38) and two sisters (ages 93 and 31). One niece was diagnosed with a soft tissue sarcoma on her ankle around age 58.  Ms. Behrmann mother is alive at age 13. There were three maternal aunts. There is no known cancer among maternal aunts/uncles or maternal first cousins. Ms. North maternal grandmother died at age 15 without cancer. Her maternal grandfather died at age 66 without cancer. A maternal second cousin (MGF's great-niece) died from ovarian cancer at age 24. A maternal first cousin once removed (MGF's niece) had an unknown type of gynecologic cancer in her 40s.   Ms. Finkle father is alive at age 34 and was diagnosed with bladder cancer at age 65. There are two paternal aunts and four paternal uncles. Once uncle died in his late 32s or early 55s with metastatic prostate cancer. One paternal first cousin was diagnosed with breast cancer around age 64. Ms. Brancato paternal grandmother died in her 61s without cancer. Her paternal grandfather died in his  41s from an unknown type of cancer (not colon cancer, possibly lung cancer). A paternal first cousin once removed (PGF's nephew) had lung cancer in his 61s, and died in his 38s with prostate cancer and bladder cancer.  Ms. Scharf is unaware of previous family history of genetic testing for hereditary cancer risks. Patient's ancestors  are of [place] descent. There is no reported Ashkenazi Jewish ancestry. There is no known consanguinity.   GENETIC TEST RESULTS: Genetic testing reported out on April 02, 2020. The CancerNext-Expanded +RNAinsight Panel found no pathogenic mutations. The CancerNext-Expanded gene panel offered by [facility] and includes sequencing, rearrangement, and RNA analysis for the following 77 genes: AIP, ALK, APC, ATM, AXIN2, BAP1, BARD1, BLM, BMPR1A, BRCA1, BRCA2, BRIP1, CDC73, CDH1, CDK4, CDKN1B, CDKN2A, CHEK2, CTNNA1, DICER1, FANCC, FH, FLCN, GALNT12, KIF1B, LZTR1, MAX, MEN1, MET, MLH1, MSH2, MSH3, MSH6, MUTYH, NBN, NF1, NF2, NTHL1, PALB2, PHOX2B, PMS2, POT1, PRKAR1A, PTCH1, PTEN, RAD51C, RAD51D, RB1, RECQL, RET, SDHA, SDHAF2, SDHB, SDHC, SDHD, SMAD4, SMARCA4, SMARCB1, SMARCE1, STK11, SUFU, TMEM127, TP53, TSC1, TSC2, VHL and XRCC2 (sequencing and deletion/duplication); EGFR, EGLN1, HOXB13, KIT, MITF, PDGFRA, POLD1, and POLE (sequencing only); EPCAM and GREM1 (deletion/duplication only).   The test report has been scanned into EPIC and is located under the Molecular Pathology section of the Results Review tab.  A portion of the result report is included below for reference.     We discussed with Ms. Ponder that because current genetic testing is not perfect, it is possible there may be a gene mutation in one of these genes that current testing cannot detect, but that chance is small.  We also discussed, that there could be another gene that has not yet been discovered, or that we have not yet tested, that is responsible for the cancer diagnoses in the family. It is also possible there is a hereditary cause for the cancer in the family that Ms. Hipp did not inherit and therefore was not identified in her testing.  Therefore, it is important to remain in touch with cancer genetics in the future so that we can continue to offer Ms. Brandt the most up to date genetic testing.   CANCER SCREENING  RECOMMENDATIONS: Ms. Paterson test result is considered negative (normal).  This means that we have not identified a hereditary cause for her personal history of cancer at this time. Most cancers happen by chance and this negative test suggests that her cancer may fall into this category.    While reassuring, this does not definitively rule out a hereditary predisposition to cancer. It is still possible that there could be genetic mutations that are undetectable by current technology. There could be genetic mutations in genes that have not been tested or identified to increase cancer risk.  Therefore, it is recommended she continue to follow the cancer management and screening guidelines provided by her oncology and primary healthcare provider.   An individual's cancer risk and medical management are not determined by genetic test results alone. Overall cancer risk assessment incorporates additional factors, including personal medical history, family history, and any available genetic information that may result in a personalized plan for cancer prevention and surveillance  RECOMMENDATIONS FOR FAMILY MEMBERS:  Individuals in this family might be at some increased risk of developing cancer, over the general population risk, simply due to the family history of cancer.  We recommended women in this family have a yearly mammogram beginning at age 59, or 38 years younger than the earliest onset of cancer, an  annual clinical breast exam, and perform monthly breast self-exams. Women in this family should also have a gynecological exam as recommended by their primary provider. Family members should be referred for colonoscopy starting at age 85 or as recommended by their physicians.    It is also possible there is a hereditary cause for the cancer in Ms. Buxton's family that she did not inherit and therefore was not identified in her.  Based on Ms. Deveney's family history, we recommended her paternal cousin with  breast cancer in her 55s have genetic counseling and testing. Ms. Reedy plans to get in touch with this cousin in the future and can let us know if we can be of any assistance in coordinating genetic counseling for this family member.   FOLLOW-UP: Lastly, we discussed with Ms. Mullarkey that cancer genetics is a rapidly advancing field and it is possible that new genetic tests will be appropriate for her and/or her family members in the future. We encouraged her to remain in contact with cancer genetics on an annual basis so we can update her personal and family histories and let her know of advances in cancer genetics that may benefit this family.   Our contact number was provided. Ms. Catala questions were answered to her satisfaction, and she knows she is welcome to call us at anytime with additional questions or concerns.   Joane Postel M. Joette Catching, [place], [facility], [place] Genetic Counselor [email or web address] (P) [phone]

## 2020-04-07 NOTE — Progress Notes (Signed)
Cardiology Office Note:    Date:  04/07/2020   ID:  Cheyenne Garner, DOB [birth year], MRN [id number]  PCP:  Mosie Lukes, MD  Cardiologist:  Berniece Salines, DO  Electrophysiologist:  None   Referring MD: Mosie Lukes, MD    History of Present Illness:    Cheyenne Garner is a 55 y.o. female with a hx of left breast cancer recently diagnosed in February 2022, Hashimoto syndrome, bipolar disorder, anxiety and asthma is here today to establish cardiac care.  The patient was referred by her primary care provider.  She has had some chest discomfort as well as intermittent shortness of breath.  She also recently got information from her sister who has aortic aneurysm and was asked by her physician to notify her family members.  She did see her PCP before that and recommended CTA.  Patient has not gotten his testing done yet.   Past Medical History:  Diagnosis Date  . Allergies   . Anxiety   . Asthma    cough variant asthma developed in last couple of years  . Bipolar 1 disorder ([place])   . Cervical cancer screening 09/12/2016  . Colon polyp 09/12/2016  . Family history of bladder cancer   . Family history of breast cancer   . Family history of ovarian cancer   . Family history of prostate cancer   . Hx of colonic polyp 09/12/2016   Colonoscopy 2017 with 1 small polyp, done by Dr [name], repeat colonoscopy in 5 years, 2022  . Hypothyroidism   . Preventative health care 07/23/2015  . Thyroid disease     Past Surgical History:  Procedure Laterality Date  . APPENDECTOMY    . BREAST LUMPECTOMY WITH RADIOACTIVE SEED LOCALIZATION Bilateral 02/12/2020   Procedure: BILATERAL BREAST LUMPECTOMY WITH RADIOACTIVE SEED LOCALIZATION;  Surgeon: Jovita Kussmaul, MD;  Location: [place];  Service: General;  Laterality: Bilateral;  . RE-EXCISION OF BREAST LUMPECTOMY Left 03/04/2020   Procedure: LEFT BREAST MARGIN REEXCISION;  Surgeon: Jovita Kussmaul, MD;  Location: [facility];   Service: General;  Laterality: Left;  . SENTINEL NODE BIOPSY Left 03/04/2020   Procedure: SENTINEL NODE BIOPSY;  Surgeon: Jovita Kussmaul, MD;  Location: [facility];  Service: General;  Laterality: Left;  . SKIN BIOPSY     back moles removed (precancer?)    Current Medications: Current Meds  Medication Sig  . albuterol (VENTOLIN HFA) 108 (90 Base) MCG/ACT inhaler Inhale 2 puffs into the lungs every 6 (six) hours as needed for wheezing or shortness of breath.  . Cholecalciferol (VITAMIN D-3) 125 MCG (5000 UT) TABS Take 1 tablet by mouth daily at 6 (six) AM.  . cycloSPORINE (RESTASIS) 0.05 % ophthalmic emulsion Place 1 drop into both eyes 2 (two) times daily.  Marland Kitchen lithium 300 MG tablet Take 300 mg by mouth 2 (two) times daily.  . metoprolol tartrate (LOPRESSOR) 100 MG tablet Take 2 hours prior to CT  . Probiotic Product (PROBIOTIC-10 PO) Take 1 capsule by mouth daily.  Marland Kitchen thyroid (ARMOUR) 120 MG tablet Take 120 mg by mouth daily before breakfast. PATIENT TAKES 90MG  ON MON-WED-FRI AND 120MG  ON TUE-THURS-SAT  . Zinc 50 MG TABS Take 50 mg by mouth daily.     Allergies:   Gentamycin [gentamicin]   Social History   Socioeconomic History  . Marital status: Married    Spouse name: Not on file  . Number of children: Not on file  .  Years of education: Not on file  . Highest education level: Not on file  Occupational History  . Not on file  Tobacco Use  . Smoking status: Never Smoker  . Smokeless tobacco: Never Used  Substance and Sexual Activity  . Alcohol use: Yes    Comment: social  . Drug use: No  . Sexual activity: Yes    Birth control/protection: None, Post-menopausal  Other Topics Concern  . 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 No major dietary restrictions   Social Determinants of Radio broadcast assistant Strain: Not on file  Food Insecurity: Not on file  Transportation Needs: Not on  file  Physical Activity: Not on file  Stress: Not on file  Social Connections: Not on file     Family History: The patient's family history includes Aneurysm in her sister; Aortic aneurysm (age of onset: 29) in her sister; Arthritis in her brother and mother; Breast cancer (age of onset: 65) in her cousin; Cancer in her paternal grandfather; Cancer (age of onset: 23) in her niece; Cholelithiasis in her sister; Congestive Heart Failure in her paternal grandmother; Dementia in her maternal grandmother and mother; Heart disease in her father; Hypertension in her father; Macular degeneration in her maternal grandfather; Mental retardation in her sister; Neuropathy in her father; Obesity in her sister; Osteoarthritis in her maternal grandfather and maternal grandmother; Ovarian cancer (age of onset: 38) in her cousin; Prostate cancer in her paternal uncle.  ROS:   Review of Systems  Constitution: Negative for decreased appetite, fever and weight gain.  HENT: Negative for congestion, ear discharge, hoarse voice and sore throat.   Eyes: Negative for discharge, redness, vision loss in right eye and visual halos.  Cardiovascular: Negative for chest pain, dyspnea on exertion, leg swelling, orthopnea and palpitations.  Respiratory: Negative for cough, hemoptysis, shortness of breath and snoring.   Endocrine: Negative for heat intolerance and polyphagia.  Hematologic/Lymphatic: Negative for bleeding problem. Does not bruise/bleed easily.  Skin: Negative for flushing, nail changes, rash and suspicious lesions.  Musculoskeletal: Negative for arthritis, joint pain, muscle cramps, myalgias, neck pain and stiffness.  Gastrointestinal: Negative for abdominal pain, bowel incontinence, diarrhea and excessive appetite.  Genitourinary: Negative for decreased libido, genital sores and incomplete emptying.  Neurological: Negative for brief paralysis, focal weakness, headaches and loss of balance.   Psychiatric/Behavioral: Negative for altered mental status, depression and suicidal ideas.  Allergic/Immunologic: Negative for HIV exposure and persistent infections.    EKGs/Labs/Other Studies Reviewed:    The following studies were reviewed today:   EKG:  The ekg ordered today demonstrates sinus rhythm, underlying incomplete right bundle branch block compared to prior EKG done in 2016 no significant change.  Recent Labs: 03/16/2020: ALT 15; BUN 19; Creatinine 0.87; Hemoglobin 14.0; Platelet Count 145; Potassium 3.7; Sodium 143 03/18/2020: TSH 0.19  Recent Lipid Panel    Component Value Date/Time   CHOL 153 03/18/2020 0738   TRIG 53.0 03/18/2020 0738   HDL 65.00 03/18/2020 0738   CHOLHDL 2 03/18/2020 0738   VLDL 10.6 03/18/2020 0738   LDLCALC 78 03/18/2020 0738    Physical Exam:    VS:  BP 110/78 (BP Location: Right Arm)   Pulse 65   Ht 5\' 6"  (1.676 m)   Wt 139 lb (63 kg)   SpO2 98%   BMI 22.44 kg/m     Wt Readings from Last 3 Encounters:  04/07/20 139 lb (63 kg)  03/23/20 140 lb (63.5 kg)  03/16/20 141 lb 1.6 oz (64 kg)     GEN: Well nourished, well developed in no acute distress HEENT: Normal NECK: No JVD; No carotid bruits LYMPHATICS: No lymphadenopathy CARDIAC: S1S2 noted,RRR, no murmurs, rubs, gallops RESPIRATORY:  Clear to auscultation without rales, wheezing or rhonchi  ABDOMEN: Soft, non-tender, non-distended, +bowel sounds, no guarding. EXTREMITIES: No edema, No cyanosis, no clubbing MUSCULOSKELETAL:  No deformity  SKIN: Warm and dry NEUROLOGIC:  Alert and oriented x 3, non-focal PSYCHIATRIC:  Normal affect, good insight  ASSESSMENT:    1. Shortness of breath   2. Other chest pain    PLAN:     Her chest pain is concerning patient does have intermediate risk for coronary artery disease would not like to do is pursue an ischemic evaluation in this patient.  Shared decision a coronary CTA at this time is appropriate.  I have discussed with the  patient about the testing.  The patient has no IV contrast allergy and is agreeable to proceed with this test.  In addition hopefully we can be able to understand her dimensions of her proximal ascending aorta as well.  Echocardiogram will also be done to assess LV/RV function and any other structure abnormalities.  The patient is in agreement with the above plan. The patient left the office in stable condition.  The patient will follow up in 3 months or sooner if needed.   Medication Adjustments/Labs and Tests Ordered: Current medicines are reviewed at length with the patient today.  Concerns regarding medicines are outlined above.  Orders Placed This Encounter  Procedures  . CT CORONARY MORPH W/CTA COR W/SCORE W/CA W/CM &/OR WO/CM  . CT CORONARY FRACTIONAL FLOW RESERVE DATA PREP  . CT CORONARY FRACTIONAL FLOW RESERVE FLUID ANALYSIS  . Basic metabolic panel  . Magnesium  . EKG 12-Lead  . ECHOCARDIOGRAM COMPLETE   Meds ordered this encounter  Medications  . metoprolol tartrate (LOPRESSOR) 100 MG tablet    Sig: Take 2 hours prior to CT    Dispense:  1 tablet    Refill:  0    Patient Instructions  Medication Instructions:  Your physician recommends that you continue on your current medications as directed. Please refer to the Current Medication list given to you today.  *If you need a refill on your cardiac medications before your next appointment, please call your pharmacy*   Lab Work: Your physician recommends that you return for lab work: 3-7 days before CT: BMET, Mag If you have labs (blood work) drawn today and your tests are completely normal, you will receive your results only by: Marland Kitchen MyChart Message (if you have MyChart) OR . A paper copy in the mail If you have any lab test that is abnormal or we need to change your treatment, we will call you to review the results.   Testing/Procedures: Your physician has requested that you have an echocardiogram. Echocardiography is  a painless test that uses sound waves to create images of your heart. It provides your doctor with information about the size and shape of your heart and how well your heart's chambers and valves are working. This procedure takes approximately one hour. There are no restrictions for this procedure.  Your cardiac CT will be scheduled at:   [facility] [address] [place], [place] [address] [phone]   If scheduled at [facility], please arrive at the [facility] main entrance (  entrance A) of [facility] 30 minutes prior to test start time. Proceed to the [facility] Radiology Department (first floor) to check-in and test prep.   Please follow these instructions carefully (unless otherwise directed):   On the Night Before the Test: . Be sure to Drink plenty of water. . Do not consume any caffeinated/decaffeinated beverages or chocolate 12 hours prior to your test. . Do not take any antihistamines 12 hours prior to your test.  On the Day of the Test: . Drink plenty of water until 1 hour prior to the test. . Do not eat any food 4 hours prior to the test. . You may take your regular medications prior to the test.  . Take metoprolol (Lopressor) two hours prior to test. . FEMALES- please wear underwire-free bra if available       After the Test: . Drink plenty of water. . After receiving IV contrast, you may experience a mild flushed feeling. This is normal. . On occasion, you may experience a mild rash up to 24 hours after the test. This is not dangerous. If this occurs, you can take Benadryl 25 mg and increase your fluid intake. . If you experience trouble breathing, this can be serious. If it is severe call 911 IMMEDIATELY. If it is mild, please call our office. . If you take any of these medications: Glipizide/Metformin, Avandament, Glucavance, please do not take 48 hours after completing test unless otherwise instructed.   Once we have confirmed  authorization from your insurance company, we will call you to set up a date and time for your test. Based on how quickly your insurance processes prior authorizations requests, please allow up to 4 weeks to be contacted for scheduling your Cardiac CT appointment. Be advised that routine Cardiac CT appointments could be scheduled as many as 8 weeks after your provider has ordered it.  For non-scheduling related questions, please contact the cardiac imaging nurse navigator should you have any questions/concerns: Marchia Bond, Cardiac Imaging Nurse Navigator Gordy Clement, Cardiac Imaging Nurse Navigator [place] [facility] Direct Office Dial: [phone]   For scheduling needs, including cancellations and rescheduling, please call [place], [phone].   Follow-Up: At [facility], you and your health needs are our priority.  As part of our continuing mission to provide you with exceptional heart care, we have created designated Provider Care Teams.  These Care Teams include your primary Cardiologist (physician) and Advanced Practice Providers (APPs -  Physician Assistants and Nurse Practitioners) who all work together to provide you with the care you need, when you need it.  We recommend signing up for the patient portal called "MyChart".  Sign up information is provided on this After Visit Summary.  MyChart is used to connect with patients for Virtual Visits (Telemedicine).  Patients are able to view lab/test results, encounter notes, upcoming appointments, etc.  Non-urgent messages can be sent to your provider as well.   To learn more about what you can do with MyChart, go to [email or web address].    Your next appointment:   3 month(s)  The format for your next appointment:   In Person  Provider:   Berniece Salines, DO   Other Instructions      Adopting a Healthy Lifestyle.  Know what a healthy weight is for you (roughly BMI <25) and aim to maintain this    Aim for 7+ servings of fruits and vegetables daily   65-80+ fluid ounces of water or unsweet tea  for healthy kidneys   Limit to max 1 drink of alcohol per day; avoid smoking/tobacco   Limit animal fats in diet for cholesterol and heart health - choose grass fed whenever available   Avoid highly processed foods, and foods high in saturated/trans fats   Aim for low stress - take time to unwind and care for your mental health   Aim for 150 min of moderate intensity exercise weekly for heart health, and weights twice weekly for bone health   Aim for 7-9 hours of sleep daily   When it comes to diets, agreement about the perfect plan isnt easy to find, even among the experts. Experts at the [employer] developed an idea known as the Healthy Eating Plate. Just imagine a plate divided into logical, healthy portions.   The emphasis is on diet quality:   Load up on vegetables and fruits - one-half of your plate: Aim for color and variety, and remember that potatoes dont count.   Go for whole grains - one-quarter of your plate: Whole wheat, barley, wheat berries, quinoa, oats, brown rice, and foods made with them. If you want pasta, go with whole wheat pasta.   Protein power - one-quarter of your plate: Fish, chicken, beans, and nuts are all healthy, versatile protein sources. Limit red meat.   The diet, however, does go beyond the plate, offering a few other suggestions.   Use healthy plant oils, such as olive, canola, soy, corn, sunflower and peanut. Check the labels, and avoid partially hydrogenated oil, which have unhealthy trans fats.   If youre thirsty, drink water. Coffee and tea are good in moderation, but skip sugary drinks and limit milk and dairy products to one or two daily servings.   The type of carbohydrate in the diet is more important than the amount. Some sources of carbohydrates, such as vegetables, fruits, whole grains, and beans-are healthier than  others.   Finally, stay active  Signed, Berniece Salines, DO  04/07/2020 7:36 PM    [facility]

## 2020-04-07 NOTE — Patient Instructions (Signed)
Medication Instructions:  Your physician recommends that you continue on your current medications as directed. Please refer to the Current Medication list given to you today.  *If you need a refill on your cardiac medications before your next appointment, please call your pharmacy*   Lab Work: Your physician recommends that you return for lab work: 3-7 days before CT: BMET, Mag If you have labs (blood work) drawn today and your tests are completely normal, you will receive your results only by: Marland Kitchen MyChart Message (if you have MyChart) OR . A paper copy in the mail If you have any lab test that is abnormal or we need to change your treatment, we will call you to review the results.   Testing/Procedures: Your physician has requested that you have an echocardiogram. Echocardiography is a painless test that uses sound waves to create images of your heart. It provides your doctor with information about the size and shape of your heart and how well your heart's chambers and valves are working. This procedure takes approximately one hour. There are no restrictions for this procedure.  Your cardiac CT will be scheduled at:   [facility] [address] [place], [place] [address] [phone]   If scheduled at [facility], please arrive at the [facility] main entrance (entrance A) of [facility] 30 minutes prior to test start time. Proceed to the [facility] Radiology Department (first floor) to check-in and test prep.   Please follow these instructions carefully (unless otherwise directed):   On the Night Before the Test: . Be sure to Drink plenty of water. . Do not consume any caffeinated/decaffeinated beverages or chocolate 12 hours prior to your test. . Do not take any antihistamines 12 hours prior to your test.  On the Day of the Test: . Drink plenty of water until 1 hour prior to the test. . Do not eat any food 4 hours prior to the test. . You may  take your regular medications prior to the test.  . Take metoprolol (Lopressor) two hours prior to test. . FEMALES- please wear underwire-free bra if available       After the Test: . Drink plenty of water. . After receiving IV contrast, you may experience a mild flushed feeling. This is normal. . On occasion, you may experience a mild rash up to 24 hours after the test. This is not dangerous. If this occurs, you can take Benadryl 25 mg and increase your fluid intake. . If you experience trouble breathing, this can be serious. If it is severe call 911 IMMEDIATELY. If it is mild, please call our office. . If you take any of these medications: Glipizide/Metformin, Avandament, Glucavance, please do not take 48 hours after completing test unless otherwise instructed.   Once we have confirmed authorization from your insurance company, we will call you to set up a date and time for your test. Based on how quickly your insurance processes prior authorizations requests, please allow up to 4 weeks to be contacted for scheduling your Cardiac CT appointment. Be advised that routine Cardiac CT appointments could be scheduled as many as 8 weeks after your provider has ordered it.  For non-scheduling related questions, please contact the cardiac imaging nurse navigator should you have any questions/concerns: Marchia Bond, Cardiac Imaging Nurse Navigator Gordy Clement, Cardiac Imaging Nurse Navigator [place] [facility] Direct Office Dial: [phone]   For scheduling needs, including cancellations and rescheduling, please call [place], [phone].  Follow-Up: At [facility], you and your health needs are our priority.  As part of our continuing mission to provide you with exceptional heart care, we have created designated Provider Care Teams.  These Care Teams include your primary Cardiologist (physician) and Advanced Practice Providers (APPs -  Physician Assistants and Nurse  Practitioners) who all work together to provide you with the care you need, when you need it.  We recommend signing up for the patient portal called "MyChart".  Sign up information is provided on this After Visit Summary.  MyChart is used to connect with patients for Virtual Visits (Telemedicine).  Patients are able to view lab/test results, encounter notes, upcoming appointments, etc.  Non-urgent messages can be sent to your provider as well.   To learn more about what you can do with MyChart, go to [email or web address].    Your next appointment:   3 month(s)  The format for your next appointment:   In Person  Provider:   Berniece Salines, DO   Other Instructions

## 2020-04-08 ENCOUNTER — Ambulatory Visit
Admission: RE | Admit: 2020-04-08 | Discharge: 2020-04-08 | Disposition: A | Discharge status: Home / Self Care | Payer: BC Managed Care – PPO | Source: Ambulatory Visit | Attending: Radiation Oncology | Admitting: Radiation Oncology

## 2020-04-08 DIAGNOSIS — Z170 Estrogen receptor positive status [ER+]: Secondary | ICD-10-CM | POA: Diagnosis not present

## 2020-04-08 DIAGNOSIS — C50412 Malignant neoplasm of upper-outer quadrant of left female breast: Secondary | ICD-10-CM | POA: Diagnosis not present

## 2020-04-08 DIAGNOSIS — Z510 Encounter for antineoplastic radiation therapy: Secondary | ICD-10-CM | POA: Diagnosis not present

## 2020-04-09 ENCOUNTER — Ambulatory Visit
Admission: RE | Admit: 2020-04-09 | Discharge: 2020-04-09 | Disposition: A | Discharge status: Home / Self Care | Payer: BC Managed Care – PPO | Source: Ambulatory Visit | Attending: Radiation Oncology | Admitting: Radiation Oncology

## 2020-04-09 ENCOUNTER — Other Ambulatory Visit: Payer: Self-pay

## 2020-04-09 DIAGNOSIS — Z170 Estrogen receptor positive status [ER+]: Secondary | ICD-10-CM | POA: Diagnosis not present

## 2020-04-09 DIAGNOSIS — C50412 Malignant neoplasm of upper-outer quadrant of left female breast: Secondary | ICD-10-CM | POA: Diagnosis not present

## 2020-04-09 DIAGNOSIS — Z510 Encounter for antineoplastic radiation therapy: Secondary | ICD-10-CM | POA: Diagnosis not present

## 2020-04-12 ENCOUNTER — Other Ambulatory Visit: Payer: Self-pay

## 2020-04-12 ENCOUNTER — Ambulatory Visit
Admission: RE | Admit: 2020-04-12 | Discharge: 2020-04-12 | Disposition: A | Discharge status: Home / Self Care | Payer: BC Managed Care – PPO | Source: Ambulatory Visit | Attending: Radiation Oncology | Admitting: Radiation Oncology

## 2020-04-12 ENCOUNTER — Ambulatory Visit (HOSPITAL_COMMUNITY): Payer: BC Managed Care – PPO | Attending: Internal Medicine

## 2020-04-12 DIAGNOSIS — Z170 Estrogen receptor positive status [ER+]: Secondary | ICD-10-CM | POA: Diagnosis not present

## 2020-04-12 DIAGNOSIS — R0789 Other chest pain: Secondary | ICD-10-CM

## 2020-04-12 DIAGNOSIS — R0602 Shortness of breath: Secondary | ICD-10-CM

## 2020-04-12 DIAGNOSIS — C50412 Malignant neoplasm of upper-outer quadrant of left female breast: Secondary | ICD-10-CM | POA: Diagnosis not present

## 2020-04-12 DIAGNOSIS — Z510 Encounter for antineoplastic radiation therapy: Secondary | ICD-10-CM | POA: Diagnosis not present

## 2020-04-12 LAB — ECHOCARDIOGRAM COMPLETE
Area-P 1/2: 3.21 cm2
S' Lateral: 3.2 cm

## 2020-04-13 ENCOUNTER — Ambulatory Visit
Admission: RE | Admit: 2020-04-13 | Discharge: 2020-04-13 | Disposition: A | Discharge status: Home / Self Care | Payer: BC Managed Care – PPO | Source: Ambulatory Visit | Attending: Radiation Oncology | Admitting: Radiation Oncology

## 2020-04-13 ENCOUNTER — Encounter: Payer: BC Managed Care – PPO | Admitting: Physical Therapy

## 2020-04-13 DIAGNOSIS — Z510 Encounter for antineoplastic radiation therapy: Secondary | ICD-10-CM | POA: Diagnosis not present

## 2020-04-13 DIAGNOSIS — Z170 Estrogen receptor positive status [ER+]: Secondary | ICD-10-CM | POA: Diagnosis not present

## 2020-04-13 DIAGNOSIS — C50412 Malignant neoplasm of upper-outer quadrant of left female breast: Secondary | ICD-10-CM | POA: Diagnosis not present

## 2020-04-14 ENCOUNTER — Telehealth: Payer: Self-pay | Admitting: Cardiology

## 2020-04-14 ENCOUNTER — Ambulatory Visit
Admission: RE | Admit: 2020-04-14 | Discharge: 2020-04-14 | Disposition: A | Discharge status: Home / Self Care | Payer: BC Managed Care – PPO | Source: Ambulatory Visit | Attending: Radiation Oncology | Admitting: Radiation Oncology

## 2020-04-14 ENCOUNTER — Other Ambulatory Visit: Payer: Self-pay

## 2020-04-14 DIAGNOSIS — C50412 Malignant neoplasm of upper-outer quadrant of left female breast: Secondary | ICD-10-CM | POA: Diagnosis not present

## 2020-04-14 DIAGNOSIS — Z510 Encounter for antineoplastic radiation therapy: Secondary | ICD-10-CM | POA: Diagnosis not present

## 2020-04-14 DIAGNOSIS — Z170 Estrogen receptor positive status [ER+]: Secondary | ICD-10-CM | POA: Diagnosis not present

## 2020-04-14 NOTE — Telephone Encounter (Signed)
Patient returning phone call about results

## 2020-04-14 NOTE — Telephone Encounter (Signed)
RN returned call to patient regarding echo results reviewed with patient. All questions answered.   Patient wanted to let Dr. Harriet Masson know an update regarding some family history. Patient states since her sisters fall and discovery of her aortic aneurysm, it was recommended that the siblings receive testing and her brother was found to have a 4cm ascending aortic aneurysm as well. Patient wanted to let Dr. Harriet Masson know. RN advised I would send a message to Dr. Harriet Masson. Patient verbalized understanding and thanked Therapist, sports for call.    Message sent to Dr.Tobb.

## 2020-04-15 ENCOUNTER — Encounter: Payer: BC Managed Care – PPO | Admitting: Physical Therapy

## 2020-04-15 ENCOUNTER — Ambulatory Visit
Admission: RE | Admit: 2020-04-15 | Discharge: 2020-04-15 | Disposition: A | Discharge status: Home / Self Care | Payer: BC Managed Care – PPO | Source: Ambulatory Visit | Attending: Radiation Oncology | Admitting: Radiation Oncology

## 2020-04-15 ENCOUNTER — Other Ambulatory Visit: Payer: Self-pay

## 2020-04-15 DIAGNOSIS — C50412 Malignant neoplasm of upper-outer quadrant of left female breast: Secondary | ICD-10-CM | POA: Diagnosis not present

## 2020-04-15 DIAGNOSIS — Z170 Estrogen receptor positive status [ER+]: Secondary | ICD-10-CM | POA: Diagnosis not present

## 2020-04-15 DIAGNOSIS — Z510 Encounter for antineoplastic radiation therapy: Secondary | ICD-10-CM | POA: Diagnosis not present

## 2020-04-16 ENCOUNTER — Ambulatory Visit
Admission: RE | Admit: 2020-04-16 | Discharge: 2020-04-16 | Disposition: A | Discharge status: Home / Self Care | Payer: BC Managed Care – PPO | Source: Ambulatory Visit | Attending: Radiation Oncology | Admitting: Radiation Oncology

## 2020-04-16 ENCOUNTER — Other Ambulatory Visit: Payer: Self-pay

## 2020-04-16 DIAGNOSIS — Z170 Estrogen receptor positive status [ER+]: Secondary | ICD-10-CM | POA: Insufficient documentation

## 2020-04-16 DIAGNOSIS — C50412 Malignant neoplasm of upper-outer quadrant of left female breast: Secondary | ICD-10-CM | POA: Diagnosis not present

## 2020-04-16 DIAGNOSIS — Z510 Encounter for antineoplastic radiation therapy: Secondary | ICD-10-CM | POA: Insufficient documentation

## 2020-04-19 ENCOUNTER — Ambulatory Visit
Admission: RE | Admit: 2020-04-19 | Discharge: 2020-04-19 | Disposition: A | Discharge status: Home / Self Care | Payer: BC Managed Care – PPO | Source: Ambulatory Visit | Attending: Radiation Oncology | Admitting: Radiation Oncology

## 2020-04-19 ENCOUNTER — Other Ambulatory Visit: Payer: Self-pay

## 2020-04-19 ENCOUNTER — Other Ambulatory Visit (HOSPITAL_COMMUNITY): Payer: Self-pay | Admitting: Emergency Medicine

## 2020-04-19 DIAGNOSIS — Z8249 Family history of ischemic heart disease and other diseases of the circulatory system: Secondary | ICD-10-CM

## 2020-04-19 DIAGNOSIS — C50412 Malignant neoplasm of upper-outer quadrant of left female breast: Secondary | ICD-10-CM | POA: Diagnosis not present

## 2020-04-19 DIAGNOSIS — Z510 Encounter for antineoplastic radiation therapy: Secondary | ICD-10-CM | POA: Diagnosis not present

## 2020-04-19 DIAGNOSIS — Z170 Estrogen receptor positive status [ER+]: Secondary | ICD-10-CM | POA: Diagnosis not present

## 2020-04-19 NOTE — Progress Notes (Signed)
CTA chest aorta order added to eval ascending aorta with coronary CTA.  Marchia Bond RN Navigator Cardiac Imaging [facility] [phone] Office  [phone] Cell

## 2020-04-20 ENCOUNTER — Telehealth (HOSPITAL_COMMUNITY): Payer: Self-pay | Admitting: Emergency Medicine

## 2020-04-20 ENCOUNTER — Ambulatory Visit
Admission: RE | Admit: 2020-04-20 | Discharge: 2020-04-20 | Disposition: A | Discharge status: Home / Self Care | Payer: BC Managed Care – PPO | Source: Ambulatory Visit | Attending: Radiation Oncology | Admitting: Radiation Oncology

## 2020-04-20 DIAGNOSIS — Z170 Estrogen receptor positive status [ER+]: Secondary | ICD-10-CM | POA: Diagnosis not present

## 2020-04-20 DIAGNOSIS — C50412 Malignant neoplasm of upper-outer quadrant of left female breast: Secondary | ICD-10-CM | POA: Diagnosis not present

## 2020-04-20 DIAGNOSIS — Z510 Encounter for antineoplastic radiation therapy: Secondary | ICD-10-CM | POA: Diagnosis not present

## 2020-04-20 NOTE — Telephone Encounter (Signed)
Reaching out to patient to offer assistance regarding upcoming cardiac imaging study; pt verbalizes understanding of appt date/time, parking situation and where to check in, pre-test NPO status and medications ordered, and verified current allergies; name and call back number provided for further questions should they arise Cheyenne Bond RN Navigator Cardiac Imaging [facility] [phone] office [phone] cell   R arm IV sticks only 100mg  metoprolol 2 hr prior to scan  Clarise Cruz

## 2020-04-20 NOTE — Telephone Encounter (Signed)
Attempted to call patient regarding upcoming cardiac CT appointment. °Left message on voicemail with name and callback number °Reesa Gotschall RN Navigator Cardiac Imaging °[facility] °[phone] Office °[phone] Cell ° °

## 2020-04-21 ENCOUNTER — Ambulatory Visit (HOSPITAL_COMMUNITY)
Admission: RE | Admit: 2020-04-21 | Discharge: 2020-04-21 | Disposition: A | Discharge status: Home / Self Care | Payer: BC Managed Care – PPO | Source: Ambulatory Visit | Attending: Cardiology | Admitting: Cardiology

## 2020-04-21 ENCOUNTER — Other Ambulatory Visit: Payer: Self-pay

## 2020-04-21 ENCOUNTER — Ambulatory Visit
Admission: RE | Admit: 2020-04-21 | Discharge: 2020-04-21 | Disposition: A | Discharge status: Home / Self Care | Payer: BC Managed Care – PPO | Source: Ambulatory Visit | Attending: Radiation Oncology | Admitting: Radiation Oncology

## 2020-04-21 DIAGNOSIS — Z510 Encounter for antineoplastic radiation therapy: Secondary | ICD-10-CM | POA: Diagnosis not present

## 2020-04-21 DIAGNOSIS — Z8249 Family history of ischemic heart disease and other diseases of the circulatory system: Secondary | ICD-10-CM | POA: Insufficient documentation

## 2020-04-21 DIAGNOSIS — Z170 Estrogen receptor positive status [ER+]: Secondary | ICD-10-CM | POA: Diagnosis not present

## 2020-04-21 DIAGNOSIS — R0602 Shortness of breath: Secondary | ICD-10-CM | POA: Insufficient documentation

## 2020-04-21 DIAGNOSIS — I712 Thoracic aortic aneurysm, without rupture: Secondary | ICD-10-CM | POA: Diagnosis not present

## 2020-04-21 DIAGNOSIS — R0789 Other chest pain: Secondary | ICD-10-CM | POA: Diagnosis not present

## 2020-04-21 DIAGNOSIS — C50412 Malignant neoplasm of upper-outer quadrant of left female breast: Secondary | ICD-10-CM | POA: Diagnosis not present

## 2020-04-21 DIAGNOSIS — R911 Solitary pulmonary nodule: Secondary | ICD-10-CM | POA: Diagnosis not present

## 2020-04-21 MED ORDER — NITROGLYCERIN 0.4 MG SL SUBL
SUBLINGUAL_TABLET | SUBLINGUAL | Status: AC
  Filled 2020-04-21: qty 1

## 2020-04-21 MED ORDER — IOHEXOL 350 MG/ML SOLN
90.0000 mL | Freq: Once | INTRAVENOUS | Status: AC | PRN
  Administered 2020-04-21: 90 mL via INTRAVENOUS

## 2020-04-21 MED ORDER — NITROGLYCERIN 0.4 MG SL SUBL
0.4000 mg | SUBLINGUAL_TABLET | Freq: Once | SUBLINGUAL | Status: AC
  Administered 2020-04-21: 0.4 mg via SUBLINGUAL

## 2020-04-22 ENCOUNTER — Ambulatory Visit
Admission: RE | Admit: 2020-04-22 | Discharge: 2020-04-22 | Disposition: A | Discharge status: Home / Self Care | Payer: BC Managed Care – PPO | Source: Ambulatory Visit | Attending: Radiation Oncology | Admitting: Radiation Oncology

## 2020-04-22 ENCOUNTER — Telehealth: Payer: Self-pay

## 2020-04-22 DIAGNOSIS — Z170 Estrogen receptor positive status [ER+]: Secondary | ICD-10-CM | POA: Diagnosis not present

## 2020-04-22 DIAGNOSIS — C50412 Malignant neoplasm of upper-outer quadrant of left female breast: Secondary | ICD-10-CM | POA: Diagnosis not present

## 2020-04-22 DIAGNOSIS — Z510 Encounter for antineoplastic radiation therapy: Secondary | ICD-10-CM | POA: Diagnosis not present

## 2020-04-22 LAB — MAGNESIUM: Magnesium: 2.1 mg/dL (ref 1.6–2.3)

## 2020-04-22 LAB — BASIC METABOLIC PANEL
BUN/Creatinine Ratio: 14 (ref 9–23)
BUN: 13 mg/dL (ref 6–24)
CO2: 25 mmol/L (ref 20–29)
Calcium: 9.8 mg/dL (ref 8.7–10.2)
Chloride: 104 mmol/L (ref 96–106)
Creatinine, Ser: 0.93 mg/dL (ref 0.57–1.00)
Glucose: 81 mg/dL (ref 65–99)
Potassium: 4.8 mmol/L (ref 3.5–5.2)
Sodium: 143 mmol/L (ref 134–144)
eGFR: 73 mL/min/{1.73_m2} (ref >59)

## 2020-04-22 NOTE — Telephone Encounter (Signed)
-----   Message from Berniece Salines, DO sent at 04/13/2020  4:44 PM EDT ----- Echo normal

## 2020-04-22 NOTE — Telephone Encounter (Signed)
Spoke with patient regarding results and recommendation.  Patient verbalizes understanding and is agreeable to plan of care. Advised patient to call back with any issues or concerns.  

## 2020-04-23 ENCOUNTER — Ambulatory Visit: Payer: BC Managed Care – PPO

## 2020-04-23 ENCOUNTER — Other Ambulatory Visit: Payer: Self-pay

## 2020-04-23 ENCOUNTER — Ambulatory Visit
Admission: RE | Admit: 2020-04-23 | Discharge: 2020-04-23 | Disposition: A | Discharge status: Home / Self Care | Payer: BC Managed Care – PPO | Source: Ambulatory Visit | Attending: Radiation Oncology | Admitting: Radiation Oncology

## 2020-04-23 DIAGNOSIS — Z510 Encounter for antineoplastic radiation therapy: Secondary | ICD-10-CM | POA: Diagnosis not present

## 2020-04-23 DIAGNOSIS — Z170 Estrogen receptor positive status [ER+]: Secondary | ICD-10-CM | POA: Diagnosis not present

## 2020-04-23 DIAGNOSIS — C50412 Malignant neoplasm of upper-outer quadrant of left female breast: Secondary | ICD-10-CM | POA: Diagnosis not present

## 2020-04-26 ENCOUNTER — Ambulatory Visit
Admission: RE | Admit: 2020-04-26 | Discharge: 2020-04-26 | Disposition: A | Discharge status: Home / Self Care | Payer: BC Managed Care – PPO | Source: Ambulatory Visit | Attending: Radiation Oncology | Admitting: Radiation Oncology

## 2020-04-26 ENCOUNTER — Other Ambulatory Visit: Payer: Self-pay

## 2020-04-26 DIAGNOSIS — C50412 Malignant neoplasm of upper-outer quadrant of left female breast: Secondary | ICD-10-CM | POA: Diagnosis not present

## 2020-04-26 DIAGNOSIS — Z510 Encounter for antineoplastic radiation therapy: Secondary | ICD-10-CM | POA: Diagnosis not present

## 2020-04-26 DIAGNOSIS — Z170 Estrogen receptor positive status [ER+]: Secondary | ICD-10-CM | POA: Diagnosis not present

## 2020-04-27 ENCOUNTER — Ambulatory Visit
Admission: RE | Admit: 2020-04-27 | Discharge: 2020-04-27 | Disposition: A | Discharge status: Home / Self Care | Payer: BC Managed Care – PPO | Source: Ambulatory Visit | Attending: Radiation Oncology | Admitting: Radiation Oncology

## 2020-04-27 DIAGNOSIS — C50412 Malignant neoplasm of upper-outer quadrant of left female breast: Secondary | ICD-10-CM | POA: Diagnosis not present

## 2020-04-27 DIAGNOSIS — Z510 Encounter for antineoplastic radiation therapy: Secondary | ICD-10-CM | POA: Diagnosis not present

## 2020-04-27 DIAGNOSIS — Z170 Estrogen receptor positive status [ER+]: Secondary | ICD-10-CM | POA: Diagnosis not present

## 2020-04-28 ENCOUNTER — Other Ambulatory Visit: Payer: Self-pay

## 2020-04-28 ENCOUNTER — Ambulatory Visit
Admission: RE | Admit: 2020-04-28 | Discharge: 2020-04-28 | Disposition: A | Discharge status: Home / Self Care | Payer: BC Managed Care – PPO | Source: Ambulatory Visit | Attending: Radiation Oncology | Admitting: Radiation Oncology

## 2020-04-28 DIAGNOSIS — Z170 Estrogen receptor positive status [ER+]: Secondary | ICD-10-CM | POA: Diagnosis not present

## 2020-04-28 DIAGNOSIS — Z510 Encounter for antineoplastic radiation therapy: Secondary | ICD-10-CM | POA: Diagnosis not present

## 2020-04-28 DIAGNOSIS — C50412 Malignant neoplasm of upper-outer quadrant of left female breast: Secondary | ICD-10-CM | POA: Diagnosis not present

## 2020-04-29 ENCOUNTER — Ambulatory Visit
Admission: RE | Admit: 2020-04-29 | Discharge: 2020-04-29 | Disposition: A | Discharge status: Home / Self Care | Payer: BC Managed Care – PPO | Source: Ambulatory Visit | Attending: Radiation Oncology | Admitting: Radiation Oncology

## 2020-04-29 DIAGNOSIS — C50412 Malignant neoplasm of upper-outer quadrant of left female breast: Secondary | ICD-10-CM | POA: Diagnosis not present

## 2020-04-29 DIAGNOSIS — Z510 Encounter for antineoplastic radiation therapy: Secondary | ICD-10-CM | POA: Diagnosis not present

## 2020-04-29 DIAGNOSIS — Z170 Estrogen receptor positive status [ER+]: Secondary | ICD-10-CM | POA: Diagnosis not present

## 2020-04-30 ENCOUNTER — Other Ambulatory Visit: Payer: Self-pay

## 2020-04-30 ENCOUNTER — Ambulatory Visit
Admission: RE | Admit: 2020-04-30 | Discharge: 2020-04-30 | Disposition: A | Discharge status: Home / Self Care | Payer: BC Managed Care – PPO | Source: Ambulatory Visit | Attending: Radiation Oncology | Admitting: Radiation Oncology

## 2020-04-30 ENCOUNTER — Encounter: Payer: Self-pay | Admitting: Radiation Oncology

## 2020-04-30 DIAGNOSIS — Z510 Encounter for antineoplastic radiation therapy: Secondary | ICD-10-CM | POA: Diagnosis not present

## 2020-04-30 DIAGNOSIS — Z170 Estrogen receptor positive status [ER+]: Secondary | ICD-10-CM | POA: Diagnosis not present

## 2020-04-30 DIAGNOSIS — C50412 Malignant neoplasm of upper-outer quadrant of left female breast: Secondary | ICD-10-CM | POA: Diagnosis not present

## 2020-05-03 ENCOUNTER — Telehealth: Payer: Self-pay | Admitting: Cardiology

## 2020-05-03 ENCOUNTER — Encounter: Payer: Self-pay | Admitting: *Deleted

## 2020-05-03 NOTE — Progress Notes (Signed)
  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 She developed anticipated skin changes in the treatment field as well as sensitivity and at times a burning sensation about the nipple region.  Plan: The patient will receive a call in about one month from the radiation oncology department. She will continue follow up with [name] as well.   ________________________________________________    Carola Rhine, [facility]

## 2020-05-03 NOTE — Telephone Encounter (Signed)
Pt is returning a call to [facility] from Thursday 04/29/20, she thinks it could be in regards to some results but she is unsure. Please advise

## 2020-05-04 NOTE — Telephone Encounter (Signed)
Spoke with patient, see chart.    

## 2020-05-17 NOTE — Progress Notes (Signed)
Cheyenne Garner  Telephone:([phone] Fax:([phone]) [phone]     ID: Cheyenne Garner DOB: 1965/05/08  MR#: [id number]  HDQ#:[id number]  Patient Care Team: Mosie Lukes, MD as PCP - General (Family Medicine) Berniece Salines, DO as PCP - Cardiology (Cardiology) Jovita Kussmaul, MD as Consulting Physician (General Surgery) Jackquelyn Sundberg, Virgie Dad, MD as Consulting Physician (Oncology) Kyung Rudd, MD as Consulting Physician (Radiation Oncology) Arta Silence, MD as Consulting Physician (Gastroenterology) [place], Jennefer Bravo, MD as Referring Physician (Dermatology) Rockwell Germany, RN as Oncology Nurse Navigator Mauro Kaufmann, RN as Oncology Nurse Navigator Paula Compton, MD as Consulting Physician (Obstetrics and Gynecology) Chauncey Cruel, MD OTHER MD:  CHIEF COMPLAINT: Estrogen receptor positive breast cancer  CURRENT TREATMENT:    INTERVAL HISTORY: Cheyenne Garner returns today for follow up of her estrogen receptor positive breast cancer. She was evaluated in the breast cancer clinic on 03/16/2020.  Genetic testing was performed at her last visit. Results were negative.  Since her last visit, she received radiation therapy from 04/05/2020 through 04/30/2020.   REVIEW OF SYSTEMS: Cheyenne Garner did well with her radiation treatments, although she had a strange feeling in her nipple that lasted several hours after each treatment.  This even started with the very first treatment.  She had some desquamation and used vitamin E oil with some success.  She still has some breast sensitivity and some "zingers", which she understands are benign.  Aside from these issues a detailed review of systems today was stable.   COVID 19 VACCINATION STATUS: [name] x1, most recently 01/2019   HISTORY OF CURRENT ILLNESS: From the original intake note:  Cheyenne Garner was initially evaluated in 05/2017 for a right nipple/breast mass. Diagnostic mammography showed no evidence of malignancy, and she was  subsequently referred to Dr. Marlou Starks for further evaluation. She elected for follow up of the lesion. She was then seen by dermatology, who drained the lesion. The lesion resolved but began to reoccur 6 months later. She underwent bilateral diagnostic mammography with tomography and right breast ultrasonography at [facility] on 10/21/2019 showing: breast density category C; dilated duct and nipple discharge from right breast; no intraductal mass or mammographic abnormality identified; right axilla negative for adenopathy.  She underwent breast MRI on 11/11/2019 showing: breast composition C; 1.8 cm linear clumped non-mass enhancement within lower right breast, 6 o'clock; 8 mm irregular enhancing mass within upper-outer left breast; benign-appearing 1.5 cm nonenhancing fluid-intensity mass/collection within right nipple with probable subareolar extension, corresponding to clinical area of concern.  Accordingly on 12/04/2019 she proceeded to biopsy of the bilateral breast areas in question. The pathology from this procedure ([id number]) showed:  1. Right Breast  - fibrocystic changes  - focal atypical lobular hyperplasia  --concordant   2. Left Breast  - benign breast tissue --discordant  As noted, the right breast biopsy was found to be concordant, but the left breast biopsy was discordant. She proceeded to bilateral lumpectomies on 02/12/2020 under Dr. Marlou Starks. Pathology from the procedure ([id number]) showed: 1. Right Breast  - fibrocystic change  - columnar cell change  - usual ductal hyperplasia  - intraductal papilloma  - associated microcalcifications 2. 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 Proliferation marker Ki67 at  2%. HER2 equivocal by  immunohistochemistry (2+), but negative by fluorescent in situ hybridization with a signals ratio 1.19 and number per cell 1.73.  She underwent re-excision of the positive margin, as well as left sentinel lymph node biopsy, on 03/04/2020. Pathology [id number]) showed: no residual carcinoma; benign lymph nodes (0/3).  Cancer Staging Breast cancer in female [facility]) Staging form: Breast, AJCC 8th Edition - Pathologic stage from 03/09/2020: Stage IA (pT1a, pN0, cM0, G2, ER+, PR+, HER2-) - Signed by Chauncey Cruel, MD on 03/16/2020 Stage prefix: Initial diagnosis Multigene prognostic tests performed: None Histologic grading system: 3 grade system  The patient's subsequent history is as detailed below.   PAST MEDICAL HISTORY: Past Medical History:  Diagnosis Date  . Allergies   . Anxiety   . Asthma    cough variant asthma developed in last couple of years  . Bipolar 1 disorder ([place])   . Cervical cancer screening 09/12/2016  . Colon polyp 09/12/2016  . Family history of bladder cancer   . Family history of breast cancer   . Family history of ovarian cancer   . Family history of prostate cancer   . Hx of colonic polyp 09/12/2016   Colonoscopy 2017 with 1 small polyp, done by Dr [name], repeat colonoscopy in 5 years, 2022  . Hypothyroidism   . Preventative health care 07/23/2015  . Thyroid disease     PAST SURGICAL HISTORY: Past Surgical History:  Procedure Laterality Date  . APPENDECTOMY    . BREAST LUMPECTOMY WITH RADIOACTIVE SEED LOCALIZATION Bilateral 02/12/2020   Procedure: BILATERAL BREAST LUMPECTOMY WITH RADIOACTIVE SEED LOCALIZATION;  Surgeon: Jovita Kussmaul, MD;  Location: [facility];  Service: General;  Laterality: Bilateral;  . RE-EXCISION OF BREAST LUMPECTOMY Left 03/04/2020   Procedure: LEFT BREAST MARGIN REEXCISION;  Surgeon: Jovita Kussmaul, MD;  Location: [facility];  Service: General;  Laterality: Left;  . SENTINEL NODE  BIOPSY Left 03/04/2020   Procedure: SENTINEL NODE BIOPSY;  Surgeon: Jovita Kussmaul, MD;  Location: [facility];  Service: General;  Laterality: Left;  . SKIN BIOPSY     back moles removed (precancer?)    FAMILY HISTORY: Family History  Problem Relation Age of Onset  . Arthritis Mother   . Dementia Mother   . Hypertension Father   . Heart disease Father        MI, s/p triple bypass at age 17  . Neuropathy Father        toxic peripheral   . Cancer Paternal Grandfather        young, lung cancer?, heavy smoker  . Cholelithiasis Sister   . Aortic aneurysm Sister 27  . Aneurysm Sister        descending aortic   . Mental retardation Sister        ADD, schizoaffective d/o  . Obesity Sister   . Arthritis Brother        back disease  . Ovarian cancer Cousin 72       maternal second cousin (MGF's great-niece)  . Dementia Maternal Grandmother   . Osteoarthritis Maternal Grandmother   . Macular degeneration Maternal Grandfather   . Osteoarthritis Maternal Grandfather   . Congestive Heart Failure Paternal Grandmother   . Breast cancer Cousin 2       paternal first cousin  . Prostate cancer Paternal Uncle        spread to bladder, d. 60s/70s  . Cancer Niece 11       soft tissue sarcoma  From  the genetic counselors note (February 2022):  "Ms. Prentiss's mother is alive at age 59 and may have had basal cell carcinoma of her face (unconfirmed). There were three maternal aunts. There is no known cancer among maternal aunts/uncles or maternal first cousins. Ms. Kooyman maternal grandmother died at age 27 without cancer. Her maternal grandfather died at age 70 without cancer. A maternal second cousin (MGF's great-niece) died from ovarian cancer at age 33. A maternal first cousin once removed (MGF's niece) had an unknown type of gynecologic cancer in her 96s.   Ms. Burmaster father is alive at age 33 and was diagnosed with bladder cancer at age 9. There are two paternal aunts and  four paternal uncles. Once uncle died in his late 2s or early 65s with prostate cancer and bladder cancer. One paternal first cousin was diagnosed with breast cancer around age 75. Ms. Scherer paternal grandmother died in her 45s without cancer. Her paternal grandfather died in his 60s from an unknown type of cancer (not colon cancer, possibly lung cancer). A paternal first cousin once removed (PGF's nephew) had lung cancer in his 28s, and died in his 56s with prostate cancer and bladder cancer.  "   GYNECOLOGIC HISTORY:  No LMP recorded. Patient is postmenopausal. Menarche: 55 years old Age at first live birth: 55 years old [place] P 2 LMP age 71 Contraceptive  HRT yes at least 4 years  Hysterectomy? no BSO? 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  Her husband Cheyenne Garner is an Chief Financial Officer.  He takes care of our blood analyzer is here and throughout the system.  Son Cheyenne Garner started forestry and lives in [place] week where he works for [employer] restoration taking care of their trails.  Son Cheyenne Garner is 39   ADVANCED DIRECTIVES: In the absence of any documents to the contrary the patient's husband is her healthcare power of attorney   HEALTH MAINTENANCE: Social History   Tobacco Use  . Smoking status: Never Smoker  . Smokeless tobacco: Never Used  Substance Use Topics  . Alcohol use: Yes    Comment: social  . Drug use: No     Colonoscopy: 02/2015, Dr. [name], recall 2022  PAP: 08/2016, negative  Bone density: Never done   Allergies  Allergen Reactions  . Gentamycin [Gentamicin]     Reacted to eye ointment, swelling red painful eyes    Current Outpatient Medications  Medication Sig Dispense Refill  . albuterol (VENTOLIN HFA) 108 (90 Base) MCG/ACT inhaler Inhale 2 puffs into the lungs every 6 (six) hours as needed for wheezing or shortness of breath. 1 Inhaler 2  . Cholecalciferol (VITAMIN D-3) 125 MCG (5000 UT) TABS Take 1 tablet  by mouth daily at 6 (six) AM.    . cycloSPORINE (RESTASIS) 0.05 % ophthalmic emulsion Place 1 drop into both eyes 2 (two) times daily.    Marland Kitchen lithium 300 MG tablet Take 300 mg by mouth 2 (two) times daily.    . metoprolol tartrate (LOPRESSOR) 100 MG tablet Take 2 hours prior to CT 1 tablet 0  . Probiotic Product (PROBIOTIC-10 PO) Take 1 capsule by mouth daily.    Marland Kitchen thyroid (ARMOUR) 120 MG tablet Take 120 mg by mouth daily before breakfast. PATIENT TAKES $RemoveBefor'90MG'AtILCXBDeQek$  ON MON-WED-FRI AND $RemoveBeforeD'120MG'VgzlAmCEpAoUWZ$  ON TUE-THURS-SAT    . Zinc 50 MG TABS Take 50 mg by mouth daily.     No current facility-administered medications for this  visit.    OBJECTIVE: White woman in no acute distress  Vitals:   05/18/20 1126  BP: 122/80  Pulse: 84  Resp: 18  Temp: (!) 97.4 F (36.3 C)  SpO2: 100%     Body mass index is 22.34 kg/m.   Wt Readings from Last 3 Encounters:  05/18/20 138 lb 6.4 oz (62.8 kg)  04/07/20 139 lb (63 kg)  03/23/20 140 lb (63.5 kg)      ECOG FS:1 - Symptomatic but completely ambulatory  Sclerae unicteric, EOMs intact Wearing a mask No cervical or supraclavicular adenopathy Lungs no rales or rhonchi Heart regular rate and rhythm Abd soft, nontender, positive bowel sounds MSK no focal spinal tenderness, no upper extremity lymphedema Neuro: nonfocal, well oriented, appropriate affect Breasts: The right breast is benign.  The left breast has undergone lumpectomy and radiation, with a very good cosmetic result.  There is no evidence of local recurrence.  Both axillae are benign.   LAB RESULTS:  CMP     Component Value Date/Time   NA 143 04/21/2020 0843   K 4.8 04/21/2020 0843   CL 104 04/21/2020 0843   CO2 25 04/21/2020 0843   GLUCOSE 81 04/21/2020 0843   GLUCOSE 91 03/16/2020 1546   BUN 13 04/21/2020 0843   CREATININE 0.93 04/21/2020 0843   CREATININE 0.87 03/16/2020 1546   CALCIUM 9.8 04/21/2020 0843   PROT 7.0 03/16/2020 1546   ALBUMIN 4.2 03/16/2020 1546   AST 13 (L) 03/16/2020 1546    ALT 15 03/16/2020 1546   ALKPHOS 65 03/16/2020 1546   BILITOT 0.7 03/16/2020 1546   GFRNONAA >60 03/16/2020 1546   GFRAA >60 06/25/2014 1948    No results found for: TOTALPROTELP, ALBUMINELP, A1GS, A2GS, BETS, BETA2SER, GAMS, MSPIKE, SPEI  Lab Results  Component Value Date   WBC 6.0 03/16/2020   NEUTROABS 3.2 03/16/2020   HGB 14.0 03/16/2020   HCT 43.3 03/16/2020   MCV 92.5 03/16/2020   PLT 145 (L) 03/16/2020    No results found for: LABCA2  No components found for: [id number]  No results for input(s): INR in the last 168 hours.  No results found for: LABCA2  No results found for: RSW546  No results found for: EVO350  No results found for: KXF818  No results found for: CA2729  No components found for: HGQUANT  No results found for: CEA1 / No results found for: CEA1   No results found for: AFPTUMOR  No results found for: CHROMOGRNA  No results found for: KPAFRELGTCHN, LAMBDASER, KAPLAMBRATIO (kappa/lambda light chains)  No results found for: HGBA, HGBA2QUANT, HGBFQUANT, HGBSQUAN (Hemoglobinopathy evaluation)   No results found for: LDH  No results found for: IRON, TIBC, IRONPCTSAT (Iron and TIBC)  No results found for: FERRITIN  Urinalysis    Component Value Date/Time   COLORURINE YELLOW 06/25/2014 1850   APPEARANCEUR CLEAR 06/25/2014 1850   LABSPEC 1.029 06/25/2014 1850   PHURINE 5.5 06/25/2014 1850   GLUCOSEU NEGATIVE 06/25/2014 1850   HGBUR TRACE (A) 06/25/2014 1850   BILIRUBINUR NEGATIVE 06/25/2014 1850   KETONESUR NEGATIVE 06/25/2014 1850   PROTEINUR NEGATIVE 06/25/2014 1850   UROBILINOGEN 0.2 06/25/2014 1850   NITRITE NEGATIVE 06/25/2014 1850   LEUKOCYTESUR SMALL (A) 06/25/2014 1850    STUDIES: CT CORONARY MORPH W/CTA COR W/SCORE W/CA W/CM &/OR WO/CM  Addendum Date: 04/21/2020   ADDENDUM REPORT: 04/21/2020 14:58 CLINICAL DATA:  This is a 55 year old female with chest pain. EXAM: Cardiac/Coronary  CTA TECHNIQUE: The patient was  scanned  on a [employer]. FINDINGS: A 100 kV prospective scan was triggered in the descending thoracic aorta at 111 HU's. Axial non-contrast 3 mm slices were carried out through the heart. The data set was analyzed on a dedicated work station and scored using the [place]. Gantry rotation speed was 250 msecs and collimation was .6 mm. No beta blockade and 0.8 mg of sl NTG was given. The 3D data set was reconstructed in 5% intervals of the 67-82 % of the R-R cycle. Diastolic phases were analyzed on a dedicated work station using MPR, MIP and VRT modes. The patient received 80 cc of contrast. Aorta:  Normal size (35.6 cm).  No calcifications.  No dissection. Aortic Valve:  Trileaflet.  No calcifications. Coronary Arteries:  Normal coronary origin.  Right dominance. RCA is a large dominant artery that gives rise to PDA and PLA. There is no plaque. Left main is a large artery that gives rise to LAD and LCX arteries. LAD is a large vessel that has no plaque. LCX is a non-dominant artery that gives rise to one large OM1 branch. There is no plaque. Other findings: Normal pulmonary vein drainage into the left atrium. Normal left atrial appendage without a thrombus. Normal size of the pulmonary artery. IMPRESSION: 1. Coronary calcium score of 0. This was 0 percentile for age and sex matched control. 2. Normal coronary origin with right dominance. 3. No evidence of CAD. CAD-RADS 0. No evidence of CAD (0%). Consider non-atherosclerotic causes of chest pain. Electronically Signed   By: Berniece Salines DO   On: 04/21/2020 14:58   Result Date: 04/21/2020 EXAM: OVER-READ INTERPRETATION  CT CHEST The following report is an over-read performed by radiologist Dr. Vinnie Langton of [facility], [place] on 04/21/2020. This over-read does not include interpretation of cardiac or coronary anatomy or pathology. The coronary calcium score/coronary CTA interpretation by the cardiologist is attached. COMPARISON:  None. FINDINGS:  Cluster of small 2-4 mm pulmonary nodules in the right middle lobe, likely to represent areas of mucoid impaction within terminal bronchioles. Within the visualized portions of the thorax there are no other larger more suspicious appearing pulmonary nodules or masses, there is no acute consolidative airspace disease, no pleural effusions, no pneumothorax and no lymphadenopathy. Visualized portions of the upper abdomen are unremarkable. There are no aggressive appearing lytic or blastic lesions noted in the visualized portions of the skeleton. IMPRESSION: 1. Small cluster of 2-4 mm pulmonary nodules in the right middle lobe, nonspecific, but statistically likely to represent areas of mucoid impaction within terminal bronchioles. No follow-up needed if patient is low-risk (and has no known or suspected primary neoplasm). Non-contrast chest CT can be considered in 12 months if patient is high-risk. This recommendation follows the consensus statement: Guidelines for Management of Incidental Pulmonary Nodules Detected on CT Images: From the Fleischner Society 2017; Radiology 2017; 284:228-243. Electronically Signed: By: Vinnie Langton M.D. On: 04/21/2020 14:02   CT ANGIO CHEST AORTA W/CM & OR WO/CM  Result Date: 04/22/2020 CLINICAL DATA:  55 year old female with thoracic aortic aneurysm EXAM: CT ANGIOGRAPHY CHEST WITH CONTRAST TECHNIQUE: Multidetector CT imaging of the chest was performed using the standard protocol during bolus administration of intravenous contrast. Multiplanar CT image reconstructions and MIPs were obtained to evaluate the vascular anatomy. CONTRAST:  49mL OMNIPAQUE IOHEXOL 350 MG/ML SOLN COMPARISON:  None FINDINGS: Cardiovascular: Fusiform ectasia of the ascending thoracic aorta with a maximal diameter of 3.7 cm. The aortic root is normal in caliber. No  significant atherosclerotic plaque. The heart is normal in size. No pericardial effusion. Normal caliber main pulmonary artery. Conventional 3  vessel arch anatomy. Mediastinum/Nodes: Unremarkable CT appearance of the thyroid gland. No suspicious mediastinal or hilar adenopathy. No soft tissue mediastinal mass. The thoracic esophagus is unremarkable. Lungs/Pleura: 4 mm subpleural nodule along the posterior aspect of the right upper lobe (image 27 series 7). 4 mm nodule in the right middle lobe (image 85 of series 7). 5 mm nodule in the inferior periphery of the lingula (image 109 series 7). The lungs are otherwise clear. No acute abnormality. Upper Abdomen: No acute abnormality. Musculoskeletal: No chest wall abnormality. No acute or significant osseous findings. Review of the MIP images confirms the above findings. IMPRESSION: 1. Fusiform ectasia of the ascending thoracic aorta with a maximal diameter of 3.7 cm. While not technically aneurysmal by size criteria (less than 4 cm), this does appear enlarged relative to the patient's body size and therefore imaging surveillance is recommended. Recommend follow-up CT a chest in 1-2 years. 2. Scattered small pulmonary nodules measuring no more than 5 mm. No follow-up needed if patient is low-risk. Non-contrast chest CT can be considered in 12 months if patient is high-risk. This recommendation follows the consensus statement: Guidelines for Management of Incidental Pulmonary Nodules Detected on CT Images: From the Fleischner Society 2017; Radiology 2017; 284:228-243. Signed, Criselda Peaches, MD, [facility] Electronically Signed   By: Jacqulynn Cadet M.D.   On: 04/22/2020 06:52     ELIGIBLE FOR AVAILABLE RESEARCH PROTOCOL: no  ASSESSMENT: 55 [facility]. [facility] woman status post bilateral lumpectomies 02/12/2020, showing  (a) in the right breast, an intraductal papilloma and atypical lobular hyperplasia  (b) in the left breast, a pT1a pNX, stage IA invasive ductal carcinoma, grade 2, with positive margins   (i) the invasive disease was  estrogen and progesterone receptor positive, HER-2 not amplified, with an MIB-1 of 2%   (ii) additional surgery 03/04/2020 clear margins   (iii) left axillary sentinel lymph node sampling 03/04/2020 removed 2 lymph nodes both negative  (1) adjuvant radiation completed 04/30/2020  (2) genetics testing 04/02/2020 through the [facility] Panel found no deleterious mutations in AIP, ALK, APC, ATM, AXIN2, BAP1, BARD1, BLM, BMPR1A, BRCA1, BRCA2, BRIP1, CDC73, CDH1, CDK4, CDKN1B, CDKN2A, CHEK2, CTNNA1, DICER1, FANCC, FH, FLCN, GALNT12, KIF1B, LZTR1, MAX, MEN1, MET, MLH1, MSH2, MSH3, MSH6, MUTYH, NBN, NF1, NF2, NTHL1, PALB2, PHOX2B, PMS2, POT1, PRKAR1A, PTCH1, PTEN, RAD51C, RAD51D, RB1, RECQL, RET, SDHA, SDHAF2, SDHB, SDHC, SDHD, SMAD4, SMARCA4, SMARCB1, SMARCE1, STK11, SUFU, TMEM127, TP53, TSC1, TSC2, VHL and XRCC2 (sequencing and deletion/duplication); EGFR, EGLN1, HOXB13, KIT, MITF, PDGFRA, POLD1, and POLE (sequencing only); EPCAM and GREM1 (deletion/duplication only).   (3) considering antiestrogens   PLAN: Josseline has completed local treatment for her very early stage breast cancer.  She is now ready to consider antiestrogens.  I gave her all the information regarding anastrozole and tamoxifen last visit and she has reviewed it.  She had some questions today.  The bottom line though is that she wants to try a very strict vegetarian diet and good exercise.  She wanted to know to what extent this would reduce her risk of breast cancer and I think if she does stick to it she may have a tumor or perhaps as much as 3% risk reduction but this is of course very different from the risk reduction from antiestrogens which basically cut the risk in half (from about 1%  a year to about half half a percent per year)  She is deterred by the possible toxicities and side effects of the antiestrogen agents so that although she still has not made a definitive decision she is strongly leaning  against.  Accordingly we are starting observation.  She will need Dr. Marlou Starks she tells me in August.  She will return to see me in November, and before that visit she will have her new baseline mammogram  He knows to call for any issues that may develop before then.  Total encounter time 35 minutes.Sarajane Jews C. Lenus Trauger, MD 05/18/2020 7:10 PM Medical Oncology and Hematology [facility] [place], [place] [address] Tel. [phone]    Fax. [phone]   This document serves as a record of services personally performed by Lurline Del, MD. It was created on his behalf by Wilburn Mylar, a trained medical scribe. The creation of this record is based on the scribe's personal observations and the provider's statements to them.   I, Lurline Del MD, have reviewed the above documentation for accuracy and completeness, and I agree with the above.   *Total Encounter Time as defined by the Centers for Medicare and Medicaid Services includes, in addition to the face-to-face time of a patient visit (documented in the note above) non-face-to-face time: obtaining and reviewing outside history, ordering and reviewing medications, tests or procedures, care coordination (communications with other health care professionals or caregivers) and documentation in the medical record.

## 2020-05-18 ENCOUNTER — Other Ambulatory Visit: Payer: Self-pay

## 2020-05-18 ENCOUNTER — Inpatient Hospital Stay: Payer: BC Managed Care – PPO | Attending: Oncology | Admitting: Oncology

## 2020-05-18 ENCOUNTER — Encounter: Payer: Self-pay | Admitting: Physical Therapy

## 2020-05-18 ENCOUNTER — Ambulatory Visit: Payer: BC Managed Care – PPO | Attending: General Surgery | Admitting: Physical Therapy

## 2020-05-18 VITALS — BP 122/80 | HR 84 | Temp 97.4°F | Resp 18 | Ht 66.0 in | Wt 138.4 lb

## 2020-05-18 DIAGNOSIS — Z8042 Family history of malignant neoplasm of prostate: Secondary | ICD-10-CM | POA: Diagnosis not present

## 2020-05-18 DIAGNOSIS — C50412 Malignant neoplasm of upper-outer quadrant of left female breast: Secondary | ICD-10-CM | POA: Diagnosis not present

## 2020-05-18 DIAGNOSIS — Z808 Family history of malignant neoplasm of other organs or systems: Secondary | ICD-10-CM | POA: Insufficient documentation

## 2020-05-18 DIAGNOSIS — R293 Abnormal posture: Secondary | ICD-10-CM | POA: Diagnosis not present

## 2020-05-18 DIAGNOSIS — R600 Localized edema: Secondary | ICD-10-CM | POA: Diagnosis not present

## 2020-05-18 DIAGNOSIS — Z8261 Family history of arthritis: Secondary | ICD-10-CM | POA: Diagnosis not present

## 2020-05-18 DIAGNOSIS — C50911 Malignant neoplasm of unspecified site of right female breast: Secondary | ICD-10-CM

## 2020-05-18 DIAGNOSIS — Z483 Aftercare following surgery for neoplasm: Secondary | ICD-10-CM | POA: Insufficient documentation

## 2020-05-18 DIAGNOSIS — Z8249 Family history of ischemic heart disease and other diseases of the circulatory system: Secondary | ICD-10-CM | POA: Diagnosis not present

## 2020-05-18 DIAGNOSIS — Z8379 Family history of other diseases of the digestive system: Secondary | ICD-10-CM | POA: Diagnosis not present

## 2020-05-18 DIAGNOSIS — N6041 Mammary duct ectasia of right breast: Secondary | ICD-10-CM | POA: Insufficient documentation

## 2020-05-18 DIAGNOSIS — C50812 Malignant neoplasm of overlapping sites of left female breast: Secondary | ICD-10-CM | POA: Diagnosis not present

## 2020-05-18 DIAGNOSIS — Z170 Estrogen receptor positive status [ER+]: Secondary | ICD-10-CM | POA: Diagnosis not present

## 2020-05-18 DIAGNOSIS — Z8052 Family history of malignant neoplasm of bladder: Secondary | ICD-10-CM | POA: Insufficient documentation

## 2020-05-18 DIAGNOSIS — Z8349 Family history of other endocrine, nutritional and metabolic diseases: Secondary | ICD-10-CM | POA: Diagnosis not present

## 2020-05-18 DIAGNOSIS — I2510 Atherosclerotic heart disease of native coronary artery without angina pectoris: Secondary | ICD-10-CM | POA: Diagnosis not present

## 2020-05-18 DIAGNOSIS — Z79899 Other long term (current) drug therapy: Secondary | ICD-10-CM | POA: Diagnosis not present

## 2020-05-18 DIAGNOSIS — Z9049 Acquired absence of other specified parts of digestive tract: Secondary | ICD-10-CM | POA: Diagnosis not present

## 2020-05-18 DIAGNOSIS — Z803 Family history of malignant neoplasm of breast: Secondary | ICD-10-CM | POA: Insufficient documentation

## 2020-05-18 DIAGNOSIS — Z923 Personal history of irradiation: Secondary | ICD-10-CM | POA: Insufficient documentation

## 2020-05-18 DIAGNOSIS — C50912 Malignant neoplasm of unspecified site of left female breast: Secondary | ICD-10-CM | POA: Diagnosis not present

## 2020-05-18 DIAGNOSIS — Z8041 Family history of malignant neoplasm of ovary: Secondary | ICD-10-CM | POA: Insufficient documentation

## 2020-05-18 DIAGNOSIS — Z818 Family history of other mental and behavioral disorders: Secondary | ICD-10-CM | POA: Insufficient documentation

## 2020-05-18 DIAGNOSIS — Z79811 Long term (current) use of aromatase inhibitors: Secondary | ICD-10-CM | POA: Insufficient documentation

## 2020-05-18 DIAGNOSIS — Z820 Family history of epilepsy and other diseases of the nervous system: Secondary | ICD-10-CM | POA: Diagnosis not present

## 2020-05-18 NOTE — Therapy (Signed)
[place], [place], [address] Phone: [phone]   Fax:  [phone]  Physical Therapy Treatment  Patient Details  Name: Cheyenne Garner MRN: [id number] Date of Birth: [birth year] Referring Provider (PT): Joaquin Courts Date: 05/18/2020   PT End of Session - 05/18/20 1447    Visit Number 6    Number of Visits 10    Date for PT Re-Evaluation 04/20/20    PT Start Time 4166    PT Stop Time 1435    PT Time Calculation (min) 30 min    Activity Tolerance Patient tolerated treatment well    Behavior During Therapy [facility] for tasks assessed/performed           Past Medical History:  Diagnosis Date  . Allergies   . Anxiety   . Asthma    cough variant asthma developed in last couple of years  . Bipolar 1 disorder ([place])   . Cervical cancer screening 09/12/2016  . Colon polyp 09/12/2016  . Family history of bladder cancer   . Family history of breast cancer   . Family history of ovarian cancer   . Family history of prostate cancer   . Hx of colonic polyp 09/12/2016   Colonoscopy 2017 with 1 small polyp, done by Dr [name], repeat colonoscopy in 5 years, 2022  . Hypothyroidism   . Preventative health care 07/23/2015  . Thyroid disease     Past Surgical History:  Procedure Laterality Date  . APPENDECTOMY    . BREAST LUMPECTOMY WITH RADIOACTIVE SEED LOCALIZATION Bilateral 02/12/2020   Procedure: BILATERAL BREAST LUMPECTOMY WITH RADIOACTIVE SEED LOCALIZATION;  Surgeon: Jovita Kussmaul, MD;  Location: [place];  Service: General;  Laterality: Bilateral;  . RE-EXCISION OF BREAST LUMPECTOMY Left 03/04/2020   Procedure: LEFT BREAST MARGIN REEXCISION;  Surgeon: Jovita Kussmaul, MD;  Location: [place];  Service: General;  Laterality: Left;  . SENTINEL NODE BIOPSY Left 03/04/2020   Procedure: SENTINEL NODE BIOPSY;  Surgeon: Jovita Kussmaul, MD;  Location: [place];   Service: General;  Laterality: Left;  . SKIN BIOPSY     back moles removed (precancer?)    There were no vitals filed for this visit.   Subjective Assessment - 05/18/20 1405    Subjective I am doing well since I finished my radiation. The last week of my treatment I got burns under my nipple. My nipple just healed about 3 or 4 days ago. I have just a little bit of swelling and tenderness now.    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 No/denies    Pain Score 0-No pain              [facility] PT Assessment - 05/18/20 0001      Observation/Other Assessments   Observations some slight swelling in area of lumpectomy scar that was targeted with radiation that pt reports is improving    Skin Integrity still scar tissue in area of lumpectomy scar but pt has been doing scar mobilization and feels proficient with this      AROM   Left Shoulder Flexion 180 Degrees  Left Shoulder ABduction 180 Degrees              L-DEX FLOWSHEETS - 05/18/20 1400      L-DEX LYMPHEDEMA SCREENING   Measurement Type Unilateral    L-DEX MEASUREMENT EXTREMITY Upper Extremity    POSITION  Standing    DOMINANT SIDE Left    At Risk Side Left    BASELINE SCORE (UNILATERAL) -1.7    L-DEX SCORE (UNILATERAL) -3.7    VALUE CHANGE (UNILAT) -2                             PT Education - 05/18/20 1450    Education Details instructed pt to continue with SOZO every 3 months, instructed pt to continue scar mobilization without using any cream or oil and to apply that after completing massage, no blood pressure or needle sticks on the L side from now on, answered pt's questions about pains in breast after radiation                PT Long Term Goals - 05/18/20 1420      PT LONG TERM GOAL #1   Title Pt will return to baseline shoulder ROM measurements and not demonstrate any signs or symptoms of lymphedema.    Time 4    Period Weeks    Status Achieved      PT LONG TERM GOAL #2   Title Pt will report a 75% improvement in discomfort just inferior to L axilla in area of serratus to allow improved comfort.    Baseline 04/06/20- 90% improvement    Time 4    Period Weeks    Status Achieved      PT LONG TERM GOAL #3   Title Pt will demonstrate a 50% improvement in scar tissue in area of bilateral lumpectomies to allow improved comfort.    Baseline 04/06/20- 50% improvment in scar tissue    Time 4    Period Weeks    Status Achieved      PT LONG TERM GOAL #4   Title Pt will have full shoulder ROM and not demonstrate any signs of scar tissue or swelling after completion of radiation.    Baseline 05/18/20- pt has full shoulder ROM and does have some scar tissue but reports she can do self massage for scar tissue at home    Time 6    Period Weeks    Status Achieved                 Plan - 05/18/20 1440    Clinical Impression Statement Pt returns to PT after completion of radiation. She reports she had radiation burns on her nipple but no where else. Pt reports her skin healed about 3 to 4 days ago. She has some very minimal edema in area of her scar that pt reports is improving. She does have scar tissue present in area of lumpectomy scar but pt is proficient in doing self scar mobilization. She reports she will continue doing this at home. She has full L shoulder ROM. L Dex measurements taken again today and was -3.7 which is a difference of -2 from baseline. Pt will continue to be monitored every 3 months for subclinical lymphedema using SOZO.    PT Frequency 2x / week    PT Duration 4 weeks    PT Treatment/Interventions ADLs/Self Care Home Management;Patient/family education;Therapeutic  exercise;Manual lymph drainage;Manual techniques;Scar mobilization  PT Next Visit Plan d/c this visit    PT Home Exercise Plan post op shoulder ROM; self scar tissue mobs    Consulted and Agree with Plan of Care Patient           Patient will benefit from skilled therapeutic intervention in order to improve the following deficits and impairments:  Postural dysfunction,Increased fascial restricitons,Decreased scar mobility,Increased edema  Visit Diagnosis: Aftercare following surgery for neoplasm  Localized edema  Abnormal posture  Malignant neoplasm of upper-outer quadrant of left breast in female, estrogen receptor positive ([place])     Problem List Patient Active Problem List   Diagnosis Date Noted  . 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 Genetic testing 04/07/2020  . Hypothyroidism   . Anxiety   . Allergies   . FH: thoracic aortic aneurysm 03/23/2020  . Family history of breast cancer   . Family history of ovarian cancer   . Family history of bladder cancer   . Family history of prostate cancer   . Breast cancer in female [facility]) 03/09/2020  . Bilateral dry eyes 01/27/2019  . Cervical cancer screening 09/12/2016  . Hx of colonic polyp 09/12/2016  . Colon polyp 09/12/2016  . Preventative health care 07/23/2015  . Asthma   . Thyroid disease   . Bipolar affective ([place])   . Urinary incontinence 12/25/2011    Allyson Sabal [facility] 05/18/2020, 2:54 PM  [place], [place], [address] Phone: [phone]   Fax:  [phone]  Name: Cheyenne Garner MRN: [id number] Date of Birth: 02-15-1965  PHYSICAL THERAPY DISCHARGE SUMMARY  Visits from Start of Care: 6  Current functional level related to goals / functional outcomes: All goals met   Remaining deficits: Still has some scar tissue but is able to do scar mobilization  independently    Education / Equipment: HEP , scar mobilization Plan: Patient agrees to discharge.  Patient goals were met. Patient is being discharged due to meeting the stated rehab goals.  ?????     Allyson Sabal [place], [place] 05/18/20 2:55 PM

## 2020-05-21 ENCOUNTER — Telehealth: Payer: Self-pay | Admitting: Oncology

## 2020-05-21 NOTE — Telephone Encounter (Signed)
Sch per 5/3 los, Patient aware.  

## 2020-05-24 ENCOUNTER — Ambulatory Visit: Payer: BC Managed Care – PPO

## 2020-06-24 ENCOUNTER — Other Ambulatory Visit: Payer: Self-pay

## 2020-06-24 ENCOUNTER — Other Ambulatory Visit (INDEPENDENT_AMBULATORY_CARE_PROVIDER_SITE_OTHER): Payer: BC Managed Care – PPO

## 2020-06-24 DIAGNOSIS — E079 Disorder of thyroid, unspecified: Secondary | ICD-10-CM | POA: Diagnosis not present

## 2020-06-24 LAB — T3, FREE: T3, Free: 2.7 pg/mL (ref 2.3–4.2)

## 2020-06-24 LAB — TSH: TSH: 1.67 u[IU]/mL (ref 0.35–4.50)

## 2020-06-30 DIAGNOSIS — F3111 Bipolar disorder, current episode manic without psychotic features, mild: Secondary | ICD-10-CM | POA: Diagnosis not present

## 2020-07-14 ENCOUNTER — Encounter: Payer: Self-pay | Admitting: Cardiology

## 2020-07-14 ENCOUNTER — Other Ambulatory Visit: Payer: Self-pay

## 2020-07-14 ENCOUNTER — Ambulatory Visit: Payer: BC Managed Care – PPO | Admitting: Cardiology

## 2020-07-14 VITALS — BP 100/70 | HR 79 | Ht 66.0 in | Wt 141.0 lb

## 2020-07-14 DIAGNOSIS — I77810 Thoracic aortic ectasia: Secondary | ICD-10-CM

## 2020-07-14 DIAGNOSIS — L720 Epidermal cyst: Secondary | ICD-10-CM | POA: Insufficient documentation

## 2020-07-14 NOTE — Progress Notes (Signed)
Cardiology Office Note:    Date:  07/14/2020   ID:  KASARAH SITTS, DOB 1965/07/23, MRN [id number]  PCP:  Mosie Lukes, MD  Cardiologist:  Berniece Salines, DO  Electrophysiologist:  None   Referring MD: Mosie Lukes, MD   No chief complaint on file. I am doing fine  History of Present Illness:    Cheyenne Garner is a 55 y.o. female with a hx of  left breast cancer recently diagnosed in February 2022, Hashimoto syndrome, bipolar disorder, anxiety and asthma is here today for follow-up visit.  The patient will April 08, 2019 at that time she reported that she had been experiencing some shortness of breath as well as was concerned that her sister had been diagnosed with aortic aneurysm.  At the conclusion of the visit we will get an echocardiogram and a coronary CTA to assess given her risk factors.  She was able to get her coronary CTA did not show any evidence of coronary artery disease however there is evidence of mild dilatation of the aorta as well as pulmonary nodules.  She offers no complaints today.  Past Medical History:  Diagnosis Date   Allergies    Anxiety    Asthma    cough variant asthma developed in last couple of years   Bipolar 1 disorder ([place])    Cervical cancer screening 09/12/2016   Colon polyp 09/12/2016   Family history of bladder cancer    Family history of breast cancer    Family history of ovarian cancer    Family history of prostate cancer    Hx of colonic polyp 09/12/2016   Colonoscopy 2017 with 1 small polyp, done by Dr [name], repeat colonoscopy in 5 years, 2022   Hypothyroidism    Preventative health care 07/23/2015   Thyroid disease     Past Surgical History:  Procedure Laterality Date   APPENDECTOMY     BREAST LUMPECTOMY WITH RADIOACTIVE SEED LOCALIZATION Bilateral 02/12/2020   Procedure: BILATERAL BREAST LUMPECTOMY WITH RADIOACTIVE SEED LOCALIZATION;  Surgeon: Jovita Kussmaul, MD;  Location: [place];  Service: General;   Laterality: Bilateral;   RE-EXCISION OF BREAST LUMPECTOMY Left 03/04/2020   Procedure: LEFT BREAST MARGIN REEXCISION;  Surgeon: Jovita Kussmaul, MD;  Location: [place];  Service: General;  Laterality: Left;   SENTINEL NODE BIOPSY Left 03/04/2020   Procedure: SENTINEL NODE BIOPSY;  Surgeon: [name], MD;  Location: [place];  Service: General;  Laterality: Left;   SKIN BIOPSY     back moles removed (precancer?)    Current Medications: Current Meds  Medication Sig   albuterol (VENTOLIN HFA) 108 (90 Base) MCG/ACT inhaler Inhale 2 puffs into the lungs every 6 (six) hours as needed for wheezing or shortness of breath.   Cholecalciferol (VITAMIN D-3) 125 MCG (5000 UT) TABS Take 1 tablet by mouth daily at 6 (six) AM.   cycloSPORINE (RESTASIS) 0.05 % ophthalmic emulsion Place 1 drop into both eyes 2 (two) times daily.   lithium 300 MG tablet Take 300 mg by mouth 2 (two) times daily.   Probiotic Product (PROBIOTIC-10 PO) Take 1 capsule by mouth daily.   thyroid (ARMOUR) 120 MG tablet Take 120 mg by mouth daily before breakfast. PATIENT TAKES 90MG  ON MON-WED-FRI AND 120MG  ON TUE-THURS-SAT   Zinc 50 MG TABS Take 50 mg by mouth daily.     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 No major dietary restrictions   Social Determinants of Radio broadcast assistant  Strain: Not on file  Food Insecurity: Not on file  Transportation Needs: Not on file  Physical Activity: Not on file  Stress: Not on file  Social Connections: Not on file     Family History: The patient's family history includes Aneurysm in her sister; Aortic aneurysm (age of onset: 60) in her sister; Arthritis in her brother and mother; Breast cancer (age of onset: 49) in her cousin; Cancer in her paternal grandfather; Cancer (age of onset: 85) in her niece; Cholelithiasis in her sister; Congestive Heart Failure in her paternal grandmother; Dementia in her maternal grandmother and mother; Heart disease in her father; Hypertension in her father; Macular degeneration in her maternal grandfather; Mental retardation in her sister; Neuropathy in her father; Obesity in her sister; Osteoarthritis in her maternal grandfather and maternal grandmother; Ovarian cancer (age of onset: 3) in her cousin; Prostate cancer in her paternal uncle.  ROS:   Review of Systems  Constitution: Negative for decreased appetite, fever and weight gain.  HENT: Negative for congestion, ear discharge, hoarse voice and sore throat.   Eyes: Negative for discharge, redness, vision loss in right eye and visual halos.  Cardiovascular: Negative for chest pain, dyspnea on exertion, leg swelling, orthopnea and palpitations.  Respiratory: Negative for cough, hemoptysis, shortness of breath and snoring.   Endocrine: Negative for heat intolerance and polyphagia.  Hematologic/Lymphatic: Negative for bleeding problem. Does not bruise/bleed easily.  Skin: Negative for flushing, nail changes, rash and suspicious lesions.  Musculoskeletal: Negative for arthritis, joint pain, muscle cramps, myalgias, neck pain and stiffness.  Gastrointestinal: Negative for abdominal pain, bowel incontinence, diarrhea and excessive appetite.  Genitourinary: Negative for decreased libido, genital sores and incomplete emptying.  Neurological: Negative for  brief paralysis, focal weakness, headaches and loss of balance.  Psychiatric/Behavioral: Negative for altered mental status, depression and suicidal ideas.  Allergic/Immunologic: Negative for HIV exposure and persistent infections.    EKGs/Labs/Other Studies Reviewed:    The following studies were reviewed today:   EKG:  None today  CCTA 04/21/2020   Aorta:  Normal size (35.6 cm).  No calcifications.  No dissection.   Aortic Valve:  Trileaflet.  No calcifications.   Coronary Arteries:  Normal coronary origin.  Right dominance.   RCA is a large dominant artery that gives rise to PDA and PLA. There is no plaque.   Left main is a large artery that gives rise to LAD and LCX arteries.   LAD is a large vessel that has no plaque.   LCX is a non-dominant artery that gives rise to one large OM1 branch. There is no plaque.   Other findings:   Normal pulmonary vein drainage into the left atrium.   Normal left atrial appendage without a  thrombus.   Normal size of the pulmonary artery.   IMPRESSION: 1. Coronary calcium score of 0. This was 0 percentile for age and sex matched control.   2. Normal coronary origin with right dominance.   3. No evidence of CAD. CAD-RADS 0. No evidence of CAD (0%). Consider non-atherosclerotic causes of chest pain.  TTE 03/2020 IMPRESSIONS     1. Global longitudinal strain is -21.5%. Left ventricular ejection  fraction, by estimation, is 60 to 65%. The left ventricle has normal  function. The left ventricle has no regional wall motion abnormalities.  Left ventricular diastolic parameters were  normal.   2. Right ventricular systolic function is normal. The right ventricular  size is normal. There is normal pulmonary artery systolic pressure.   3. The mitral valve is normal in structure. No evidence of mitral valve  regurgitation.   4. The aortic valve is normal in structure. Aortic valve regurgitation is  not visualized.   5. The inferior  vena cava is normal in size with greater than 50%  respiratory variability, suggesting right atrial pressure of 3 mmHg.   FINDINGS   Left Ventricle: Global longitudinal strain is -21.5%. Left ventricular  ejection fraction, by estimation, is 60 to 65%. The left ventricle has  normal function. The left ventricle has no regional wall motion  abnormalities. The left ventricular internal  cavity size was normal in size. There is no left ventricular hypertrophy.  Left ventricular diastolic parameters were normal.   Right Ventricle: The right ventricular size is normal. Right vetricular  wall thickness was not assessed. Right ventricular systolic function is  normal. There is normal pulmonary artery systolic pressure. The tricuspid  regurgitant velocity is 2.13 m/s,  and with an assumed right atrial pressure of 3 mmHg, the estimated right  ventricular systolic pressure is 71.2 mmHg.   Left Atrium: Left atrial size was normal in size.   Right Atrium: Right atrial size was normal in size.   Pericardium: There is no evidence of pericardial effusion.   Mitral Valve: The mitral valve is normal in structure. No evidence of  mitral valve regurgitation.   Tricuspid Valve: The tricuspid valve is normal in structure. Tricuspid  valve regurgitation is trivial.   Aortic Valve: The aortic valve is normal in structure. Aortic valve  regurgitation is not visualized.   Pulmonic Valve: The pulmonic valve was normal in structure. Pulmonic valve  regurgitation is not visualized.   Aorta: The aortic root and ascending aorta are structurally normal, with  no evidence of dilitation.   Venous: The inferior vena cava is normal in size with greater than 50%  respiratory variability, suggesting right atrial pressure of 3 mmHg.   IAS/Shunts: No atrial level shunt detected by color flow Doppler.     CTA chest 04/21/2020 FINDINGS: Cardiovascular: Fusiform ectasia of the ascending thoracic aorta with a  maximal diameter of 3.7 cm. The aortic root is normal in caliber. No significant atherosclerotic plaque. The heart is normal in size. No pericardial effusion. Normal caliber main pulmonary artery. Conventional 3 vessel arch anatomy.   Mediastinum/Nodes: Unremarkable CT appearance of the thyroid gland. No suspicious mediastinal or hilar adenopathy. No soft tissue mediastinal mass. The thoracic esophagus is unremarkable.   Lungs/Pleura: 4 mm subpleural nodule along the posterior aspect of the right upper lobe (image 27 series 7). 4 mm nodule in the right middle lobe (image 85 of series 7). 5 mm nodule in the inferior periphery of the lingula (image 109 series 7). The  lungs are otherwise clear. No acute abnormality.   Upper Abdomen: No acute abnormality.   Musculoskeletal: No chest wall abnormality. No acute or significant osseous findings.   Review of the MIP images confirms the above findings.   IMPRESSION: 1. Fusiform ectasia of the ascending thoracic aorta with a maximal diameter of 3.7 cm. While not technically aneurysmal by size criteria (less than 4 cm), this does appear enlarged relative to the patient's body size and therefore imaging surveillance is recommended. Recommend follow-up CT a chest in 1-2 years. 2. Scattered small pulmonary nodules measuring no more than 5 mm. No follow-up needed if patient is low-risk. Non-contrast chest CT can be considered in 12 months if patient is high-risk. This recommendation follows the consensus statement: Guidelines for Management of Incidental Pulmonary Nodules Detected on CT Images: From the Fleischner Society 2017; Radiology 2017; 284:228-243.   Signed,    Recent Labs: 03/16/2020: ALT 15; Hemoglobin 14.0; Platelet Count 145 04/21/2020: BUN 13; Creatinine, Ser 0.93; Magnesium 2.1; Potassium 4.8; Sodium 143 06/24/2020: TSH 1.67  Recent Lipid Panel    Component Value Date/Time   CHOL 153 03/18/2020 0738   TRIG 53.0 03/18/2020 0738    HDL 65.00 03/18/2020 0738   CHOLHDL 2 03/18/2020 0738   VLDL 10.6 03/18/2020 0738   LDLCALC 78 03/18/2020 0738    Physical Exam:    VS:  BP 100/70   Pulse 79   Ht 5\' 6"  (1.676 m)   Wt 141 lb (64 kg)   SpO2 98%   BMI 22.76 kg/m     Wt Readings from Last 3 Encounters:  07/14/20 141 lb (64 kg)  05/18/20 138 lb 6.4 oz (62.8 kg)  04/07/20 139 lb (63 kg)     GEN: Well nourished, well developed in no acute distress HEENT: Normal NECK: No JVD; No carotid bruits LYMPHATICS: No lymphadenopathy CARDIAC: S1S2 noted,RRR, no murmurs, rubs, gallops RESPIRATORY:  Clear to auscultation without rales, wheezing or rhonchi  ABDOMEN: Soft, non-tender, non-distended, +bowel sounds, no guarding. EXTREMITIES: No edema, No cyanosis, no clubbing MUSCULOSKELETAL:  No deformity  SKIN: Warm and dry NEUROLOGIC:  Alert and oriented x 3, non-focal PSYCHIATRIC:  Normal affect, good insight  ASSESSMENT:    1. Aortic root dilation (HCC)    PLAN:     We talked about her testing results.  At this time the recommendation is to repeat the CT scan in 1 year to reassess the aortic root dilatation.  We will discuss this more at her follow-up visit again.   The patient is in agreement with the above plan. The patient left the office in stable condition.  The patient will follow up in 9 months or sooner if needed   Medication Adjustments/Labs and Tests Ordered: Current medicines are reviewed at length with the patient today.  Concerns regarding medicines are outlined above.  No orders of the defined types were placed in this encounter.  No orders of the defined types were placed in this encounter.   Patient Instructions  Medication Instructions:  Your physician recommends that you continue on your current medications as directed. Please refer to the Current Medication list given to you today.  *If you need a refill on your cardiac medications before your next appointment, please call your  pharmacy*   Lab Work: None If you have labs (blood work) drawn today and your tests are completely normal, you will receive your results only by: [place] (if you have MyChart) OR A paper copy in the mail If  you have any lab test that is abnormal or we need to change your treatment, we will call you to review the results.   Testing/Procedures:  None   Follow-Up: At [facility], you and your health needs are our priority.  As part of our continuing mission to provide you with exceptional heart care, we have created designated Provider Care Teams.  These Care Teams include your primary Cardiologist (physician) and Advanced Practice Providers (APPs -  Physician Assistants and Nurse Practitioners) who all work together to provide you with the care you need, when you need it.  We recommend signing up for the patient portal called "MyChart".  Sign up information is provided on this After Visit Summary.  MyChart is used to connect with patients for Virtual Visits (Telemedicine).  Patients are able to view lab/test results, encounter notes, upcoming appointments, etc.  Non-urgent messages can be sent to your provider as well.   To learn more about what you can do with MyChart, go to [email or web address].    Your next appointment:   9 month(s)  The format for your next appointment:   In Person     Other Instructions    Adopting a Healthy Lifestyle.  Know what a healthy weight is for you (roughly BMI <25) and aim to maintain this   Aim for 7+ servings of fruits and vegetables daily   65-80+ fluid ounces of water or unsweet tea for healthy kidneys   Limit to max 1 drink of alcohol per day; avoid smoking/tobacco   Limit animal fats in diet for cholesterol and heart health - choose grass fed whenever available   Avoid highly processed foods, and foods high in saturated/trans fats   Aim for low stress - take time to unwind and care for your mental health   Aim for 150  min of moderate intensity exercise weekly for heart health, and weights twice weekly for bone health   Aim for 7-9 hours of sleep daily   When it comes to diets, agreement about the perfect plan isnt easy to find, even among the experts. Experts at the [employer] developed an idea known as the Healthy Eating Plate. Just imagine a plate divided into logical, healthy portions.   The emphasis is on diet quality:   Load up on vegetables and fruits - one-half of your plate: Aim for color and variety, and remember that potatoes dont count.   Go for whole grains - one-quarter of your plate: Whole wheat, barley, wheat berries, quinoa, oats, brown rice, and foods made with them. If you want pasta, go with whole wheat pasta.   Protein power - one-quarter of your plate: Fish, chicken, beans, and nuts are all healthy, versatile protein sources. Limit red meat.   The diet, however, does go beyond the plate, offering a few other suggestions.   Use healthy plant oils, such as olive, canola, soy, corn, sunflower and peanut. Check the labels, and avoid partially hydrogenated oil, which have unhealthy trans fats.   If youre thirsty, drink water. Coffee and tea are good in moderation, but skip sugary drinks and limit milk and dairy products to one or two daily servings.   The type of carbohydrate in the diet is more important than the amount. Some sources of carbohydrates, such as vegetables, fruits, whole grains, and beans-are healthier than others.   Finally, stay active  Signed, Berniece Salines, DO  07/14/2020 12:31 PM    [facility]

## 2020-07-14 NOTE — Patient Instructions (Addendum)
Medication Instructions:  Your physician recommends that you continue on your current medications as directed. Please refer to the Current Medication list given to you today.  *If you need a refill on your cardiac medications before your next appointment, please call your pharmacy*   Lab Work: None If you have labs (blood work) drawn today and your tests are completely normal, you will receive your results only by: [place] (if you have MyChart) OR A paper copy in the mail If you have any lab test that is abnormal or we need to change your treatment, we will call you to review the results.   Testing/Procedures:  None   Follow-Up: At [facility], you and your health needs are our priority.  As part of our continuing mission to provide you with exceptional heart care, we have created designated Provider Care Teams.  These Care Teams include your primary Cardiologist (physician) and Advanced Practice Providers (APPs -  Physician Assistants and Nurse Practitioners) who all work together to provide you with the care you need, when you need it.  We recommend signing up for the patient portal called "MyChart".  Sign up information is provided on this After Visit Summary.  MyChart is used to connect with patients for Virtual Visits (Telemedicine).  Patients are able to view lab/test results, encounter notes, upcoming appointments, etc.  Non-urgent messages can be sent to your provider as well.   To learn more about what you can do with MyChart, go to [email or web address].    Your next appointment:   9 month(s)  The format for your next appointment:   In Person     Other Instructions

## 2020-07-15 NOTE — Progress Notes (Signed)
    Radiation Oncology         [phone] ________________________________  Name: Cheyenne Garner MRN: [id number]  Date of Service: 07/26/2020  DOB: [birth year]  Post Treatment Telephone Note  Diagnosis:   Stage IA, pT1aN0M0 grade 2, ER/PR positive invasive ductal carcinoma of the left breast.  Interval Since Last Radiation:  13 weeks   04/05/2020 through 04/30/2020 Site Technique Total Dose (Gy) Dose per Fx (Gy) Completed Fx Beam Energies  Breast, Left: Breast_Lt 3D 42.56/42.56 2.66 16/16 6X  Breast, Left: Breast_Lt_Bst 3D 8/8 2 4/4 6X, 10X   Narrative:  The patient was contacted today for routine follow-up. During treatment she did very well with radiotherapy and did not have significant desquamation. She reports she is doing well but her nipple area was more irritated following the week after treatment but has improved. She does have a pinpoint area of erythema and at times drainage. She denies redness of the skin elsewhere but has had fullness of the breast since a recent flight.   Impression/Plan: 1. 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 The patient has been doing well since completion of radiotherapy. We discussed that we would be happy to continue to follow her as needed, but she will also continue to follow up with [name] in medical oncology. She was counseled on skin care as well as measures to avoid sun exposure to this area.  2. Survivorship. We discussed the importance of survivorship evaluation and encouraged her to attend her upcoming visit with that clinic. 3. Left breast swelling. I encouraged the patient to follow up with PT for evaluation of lymphedema.      Carola Rhine, PAC

## 2020-07-26 ENCOUNTER — Ambulatory Visit
Admission: RE | Admit: 2020-07-26 | Discharge: 2020-07-26 | Disposition: A | Discharge status: Home / Self Care | Payer: BC Managed Care – PPO | Source: Ambulatory Visit | Attending: Radiation Oncology | Admitting: Radiation Oncology

## 2020-07-26 DIAGNOSIS — C50912 Malignant neoplasm of unspecified site of left female breast: Secondary | ICD-10-CM | POA: Insufficient documentation

## 2020-07-26 DIAGNOSIS — C50911 Malignant neoplasm of unspecified site of right female breast: Secondary | ICD-10-CM

## 2020-07-27 ENCOUNTER — Other Ambulatory Visit: Payer: Self-pay | Admitting: *Deleted

## 2020-07-27 DIAGNOSIS — Z170 Estrogen receptor positive status [ER+]: Secondary | ICD-10-CM

## 2020-07-27 DIAGNOSIS — C50812 Malignant neoplasm of overlapping sites of left female breast: Secondary | ICD-10-CM

## 2020-07-29 ENCOUNTER — Other Ambulatory Visit: Payer: Self-pay

## 2020-07-29 ENCOUNTER — Ambulatory Visit: Payer: BC Managed Care – PPO | Attending: General Surgery | Admitting: Physical Therapy

## 2020-07-29 ENCOUNTER — Encounter: Payer: Self-pay | Admitting: Physical Therapy

## 2020-07-29 DIAGNOSIS — R293 Abnormal posture: Secondary | ICD-10-CM | POA: Diagnosis not present

## 2020-07-29 DIAGNOSIS — Z170 Estrogen receptor positive status [ER+]: Secondary | ICD-10-CM | POA: Insufficient documentation

## 2020-07-29 DIAGNOSIS — I890 Lymphedema, not elsewhere classified: Secondary | ICD-10-CM | POA: Diagnosis not present

## 2020-07-29 DIAGNOSIS — L599 Disorder of the skin and subcutaneous tissue related to radiation, unspecified: Secondary | ICD-10-CM

## 2020-07-29 DIAGNOSIS — C50412 Malignant neoplasm of upper-outer quadrant of left female breast: Secondary | ICD-10-CM | POA: Insufficient documentation

## 2020-07-29 DIAGNOSIS — Z9011 Acquired absence of right breast and nipple: Secondary | ICD-10-CM | POA: Diagnosis not present

## 2020-07-29 DIAGNOSIS — C50912 Malignant neoplasm of unspecified site of left female breast: Secondary | ICD-10-CM | POA: Diagnosis not present

## 2020-07-29 NOTE — Therapy (Signed)
[place], [place], [address] Phone: [phone]   Fax:  [phone]  Physical Therapy Evaluation  Patient Details  Name: Cheyenne Garner MRN: [id number] Date of Birth: [birth year] Referring Provider (PT): Shona Simpson   Encounter Date: 07/29/2020   PT End of Session - 07/29/20 1049     Visit Number 1    Number of Visits 9    Date for PT Re-Evaluation 08/26/20    PT Start Time 1012   pt arrived late   PT Stop Time 1048    PT Time Calculation (min) 36 min    Activity Tolerance Patient tolerated treatment well    Behavior During Therapy [facility] for tasks assessed/performed             Past Medical History:  Diagnosis Date   Allergies    Anxiety    Asthma    cough variant asthma developed in last couple of years   Bipolar 1 disorder ([place])    Cervical cancer screening 09/12/2016   Colon polyp 09/12/2016   Family history of bladder cancer    Family history of breast cancer    Family history of ovarian cancer    Family history of prostate cancer    Hx of colonic polyp 09/12/2016   Colonoscopy 2017 with 1 small polyp, done by Dr [name], repeat colonoscopy in 5 years, 2022   Hypothyroidism    Preventative health care 07/23/2015   Thyroid disease     Past Surgical History:  Procedure Laterality Date   APPENDECTOMY     BREAST LUMPECTOMY WITH RADIOACTIVE SEED LOCALIZATION Bilateral 02/12/2020   Procedure: BILATERAL BREAST LUMPECTOMY WITH RADIOACTIVE SEED LOCALIZATION;  Surgeon: Jovita Kussmaul, MD;  Location: [place];  Service: General;  Laterality: Bilateral;   RE-EXCISION OF BREAST LUMPECTOMY Left 03/04/2020   Procedure: LEFT BREAST MARGIN REEXCISION;  Surgeon: Jovita Kussmaul, MD;  Location: [place];  Service: General;  Laterality: Left;   SENTINEL NODE BIOPSY Left 03/04/2020   Procedure: SENTINEL NODE BIOPSY;  Surgeon: Jovita Kussmaul, MD;  Location: [place];  Service: General;  Laterality: Left;   SKIN BIOPSY     back moles removed (precancer?)    There were no vitals filed for this visit.    Subjective Assessment - 07/29/20 1024     Subjective The left breast became swollen after I took a flight. It is beginning to go down a little. I did not get a compression bra yet.    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 No/denies    Pain Score 0-No pain                [facility] PT Assessment - 07/29/20 0001       Assessment   Medical Diagnosis left breast cancer    Referring Provider (PT) Shona Simpson    Onset Date/Surgical Date 02/12/20    Hand Dominance Left    Prior Therapy none      Precautions   Precautions Other (comment)    Precaution Comments at risk for lymphedema      Restrictions   Weight  Bearing Restrictions No      Balance Screen   Has the patient fallen in the past 6 months No    Has the patient had a decrease in activity level because of a fear of falling?  No    Is the patient reluctant to leave their home because of a fear of falling?  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    Leisure pt exercises and does strength training, kick boxing      Cognition   Overall Cognitive Status Within Functional Limits for tasks assessed      Observation/Other Assessments   Observations left breast is about 1/3 larger than right breast       Posture/Postural Control   Posture/Postural Control Postural limitations    Postural Limitations Forward head      AROM   Left Shoulder Flexion 178 Degrees    Left Shoulder ABduction 180 Degrees               LYMPHEDEMA/ONCOLOGY QUESTIONNAIRE - 07/29/20 0001       Type   Cancer Type left breast cancer      Surgeries   Lumpectomy Date 02/12/20    Sentinel Lymph Node Biopsy Date 03/04/20      Lymphedema Assessments   Lymphedema Assessments Upper extremities      Left Upper Extremity Lymphedema   15 cm Proximal to Olecranon Process 27.5 cm    Olecranon Process 24.5 cm    15 cm Proximal to Ulnar Styloid Process 22.6 cm    Just Proximal to Ulnar Styloid Process 14.4 cm    Across Hand at [employer] 19 cm    At [place] of 2nd Digit 6.1 cm             L-DEX FLOWSHEETS - 07/29/20 1000       L-DEX LYMPHEDEMA SCREENING   L-DEX MEASUREMENT EXTREMITY Upper Extremity    POSITION  Standing    DOMINANT SIDE Left    At Risk Side Left    BASELINE SCORE (UNILATERAL) -1.7    L-DEX SCORE (UNILATERAL) -3.8    VALUE CHANGE (UNILAT) -2.1                    Objective measurements completed on examination: See above findings.                    PT Long Term Goals - 07/29/20 1053       PT LONG TERM GOAL #1   Title Pt will be independent in self MLD for long term management of lymphedema.    Time 4    Period Weeks    Status New    Target Date 08/26/20      PT LONG TERM GOAL #2   Title Pt will obtain appropriate compression bra for long term management of left breast lymphedema.    Time 4    Period Weeks    Status New    Target Date 08/26/20      PT LONG TERM GOAL #3   Title Pt will report a 50% decrease in swelling in L  breast to decrease risk of infection.    Time 4    Period Weeks    Status New    Target Date 08/26/20      PT LONG TERM GOAL #4   Title Pt will report no tightness across L chest with end range L shoulder ROM to allow  improved comfort.    Time 4    Period Weeks    Status New    Target Date 08/26/20                    Plan - 07/29/20 1049     Clinical Impression Statement Pt reports to PT with left breast lymphedema that began a few weeks ago after an injury when she slid while stepping out of a bathtub and she had a flight to [place]. Pt had not yet obtained her compression bra prior to her flight. She has an appointment to obtain her compression bra this afternoon. Pt's left breast is about 1/3 larger than her R breast with increased pore size noted on medial portion of breast. Remeasured SOZO and she had a difference of -2.1. SOZO only picks up on UE swelling so it does not take in to account the breast swelling. Pt would benefit from skilled PT services to decrease L breast lymphedema and assist pt with obtaining appropriate compression garments and in self MLD.    Clinical Decision Making Low    Rehab Potential Good    PT Frequency 2x / week    PT Duration 4 weeks    PT Treatment/Interventions ADLs/Self Care Home Management;Patient/family education;Therapeutic exercise;Manual lymph drainage;Manual techniques;Scar mobilization;Taping;Vasopneumatic Device    PT Next Visit Plan MLD to L breast and eventually instruct, did pt receive a compression bra?    Consulted and Agree with Plan of Care Patient             Patient will benefit from skilled therapeutic intervention in order to improve the following deficits and impairments:  Postural dysfunction, Increased fascial restricitons, Decreased scar mobility, Increased edema  Visit Diagnosis: Lymphedema, not elsewhere classified  Disorder of the skin and subcutaneous tissue related to radiation, unspecified  Abnormal posture  Malignant neoplasm of upper-outer quadrant of left breast in female, estrogen receptor positive ([place])     Problem List Patient Active Problem List   Diagnosis Date Noted   [place]ia 07/14/2020   Malignant  neoplasm of overlapping sites of left breast in female, estrogen receptor positive ([place]) 05/18/2020   Genetic testing 04/07/2020   Hypothyroidism    Anxiety    Allergies    FH: thoracic aortic aneurysm 03/23/2020   Family history of breast cancer    Family history of ovarian cancer    Family history of bladder cancer    Family history of prostate cancer    Breast cancer in female ([place]) 03/09/2020   Bilateral dry eyes 01/27/2019   Cervical cancer screening 09/12/2016   Hx of colonic polyp 09/12/2016   Colon polyp 09/12/2016   Preventative health care 07/23/2015   Asthma    Thyroid disease    Bipolar affective ([place])    Urinary incontinence 12/25/2011    Allyson Sabal [facility] 07/29/2020, 10:55 AM  [place], [place], [address] Phone: [phone]   Fax:  [phone]  Name: Cheyenne Garner MRN: [id number] Date of Birth: 13-Nov-1965  Manus Gunning, PT 07/29/20 10:55 AM

## 2020-08-04 ENCOUNTER — Other Ambulatory Visit: Payer: Self-pay

## 2020-08-04 ENCOUNTER — Ambulatory Visit: Payer: BC Managed Care – PPO

## 2020-08-04 DIAGNOSIS — L599 Disorder of the skin and subcutaneous tissue related to radiation, unspecified: Secondary | ICD-10-CM | POA: Diagnosis not present

## 2020-08-04 DIAGNOSIS — I890 Lymphedema, not elsewhere classified: Secondary | ICD-10-CM

## 2020-08-04 DIAGNOSIS — R293 Abnormal posture: Secondary | ICD-10-CM

## 2020-08-04 DIAGNOSIS — Z170 Estrogen receptor positive status [ER+]: Secondary | ICD-10-CM | POA: Diagnosis not present

## 2020-08-04 DIAGNOSIS — C50412 Malignant neoplasm of upper-outer quadrant of left female breast: Secondary | ICD-10-CM

## 2020-08-04 NOTE — Patient Instructions (Addendum)
Self manual lymph drainage: Perform this sequence once a day.  Only give enough pressure no your skin to make the skin move.  Hug yourself.  Do circles at your neck just above your collarbones.  Repeat this 10 times.  Diaphragmatic - Supine   Inhale through nose making navel move out toward hands. Exhale through puckered lips, hands follow navel in. Repeat _5__ times. Rest _10__ seconds between repeats.   Axilla - One at a Time   Using full weight of flat hand and fingers at center of uninvolved armpit, make _10__ in-place circles.   LEG: Inguinal Nodes Stimulation   With small finger side of hand against hip crease on involved side, gently perform circles at the crease. Repeat __10_ times.   Axilla to Inguinal Nodes - Sweep   On involved side, sweep _4__ times from armpit along side of trunk to hip crease.  Now gently stretch skin from the involved side to the uninvolved side across the chest at the shoulder line.  Repeat that 4 times.  Draw an imaginary diagonal line from upper outer breast through the nipple area toward lower inner breast.  Direct fluid upward and inward from this line toward the pathway across your upper chest .  Do this in three rows to treat all of the upper inner breast tissue, and do each row 3-4x.      Direct fluid to treat all of lower outer breast tissue downward and outward toward pathway that is aimed at the left groin. Do this in Rt S/L for better access to Lt lateral breast and work fluid towards side/waist pathway. Then finish in supine.   Finish by doing the pathways as described above going from your involved armpit to the same side groin and going across your upper chest from the involved shoulder to the uninvolved shoulder.  Repeat the steps above where you do circles in your left groin and right armpit. Copyright  [employer]. All rights reserved.

## 2020-08-04 NOTE — Therapy (Signed)
[place], [place], [address] Phone: [phone]   Fax:  [phone]  Physical Therapy Treatment  Patient Details  Name: Cheyenne Garner MRN: [id number] Date of Birth: July 07, 1965 Referring Provider (PT): Shona Simpson   Encounter Date: 08/04/2020   PT End of Session - 08/04/20 1152     Visit Number 2    Number of Visits 9    Date for PT Re-Evaluation 08/26/20    PT Start Time 0811   pt arrived late   PT Stop Time 0906    PT Time Calculation (min) 55 min    Activity Tolerance Patient tolerated treatment well    Behavior During Therapy [facility] for tasks assessed/performed             Past Medical History:  Diagnosis Date   Allergies    Anxiety    Asthma    cough variant asthma developed in last couple of years   Bipolar 1 disorder ([place])    Cervical cancer screening 09/12/2016   Colon polyp 09/12/2016   Family history of bladder cancer    Family history of breast cancer    Family history of ovarian cancer    Family history of prostate cancer    Hx of colonic polyp 09/12/2016   Colonoscopy 2017 with 1 small polyp, done by Dr [name], repeat colonoscopy in 5 years, 2022   Hypothyroidism    Preventative health care 07/23/2015   Thyroid disease     Past Surgical History:  Procedure Laterality Date   APPENDECTOMY     BREAST LUMPECTOMY WITH RADIOACTIVE SEED LOCALIZATION Bilateral 02/12/2020   Procedure: BILATERAL BREAST LUMPECTOMY WITH RADIOACTIVE SEED LOCALIZATION;  Surgeon: Jovita Kussmaul, MD;  Location: [place];  Service: General;  Laterality: Bilateral;   RE-EXCISION OF BREAST LUMPECTOMY Left 03/04/2020   Procedure: LEFT BREAST MARGIN REEXCISION;  Surgeon: Jovita Kussmaul, MD;  Location: [place];  Service: General;  Laterality: Left;   SENTINEL NODE BIOPSY Left 03/04/2020   Procedure: SENTINEL NODE BIOPSY;  Surgeon: Jovita Kussmaul, MD;  Location: [place];  Service: General;  Laterality: Left;   SKIN BIOPSY     back moles removed (precancer?)    There were no vitals filed for this visit.   Subjective Assessment - 08/04/20 0816     Subjective I got my compression bra and Blaire told me to wear it 24/7 so I've been trying that and I can already tell an improvement with the swelling.    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 No/denies   just tender to touch at Lt lateral breast                              [facility] Adult PT Treatment/Exercise - 08/04/20 0001       Manual Therapy   Manual Therapy Manual Lymphatic Drainage (MLD);Edema management    Edema Management Assessed pts new compression bra which appears to be a good fit but doesn't fully cover towards anterior axilla so pt plans to try on larger size (medium) at  [facility] [place] ASAP to see if this will give her better coverage.    Manual Lymphatic Drainage (MLD) In Supine: Short neck, 5 diaphragmatic breaths, Rt axillary and Lt inguinal nodes, anterior inter-axillary and Lt axillo-inguinal anastomosis, then focued on superior aspect of Lt breast redirecting towards anterior anastomosis, then into Rt S/L for further work to lateral breast redirecting towards axillo-inguinal anastomosis, then finished retracing all steps in supine and began instructing pt in same having her return demo of each step.                    PT Education - 08/04/20 0825     Education Details Self MLD    Person(s) Educated Patient    Methods Explanation;Demonstration;Handout    Comprehension Verbalized understanding;Returned demonstration;Verbal cues required;Tactile cues required;Need  further instruction                 PT Long Term Goals - 07/29/20 1053       PT LONG TERM GOAL #1   Title Pt will be independent in self MLD for long term management of lymphedema.    Time 4    Period Weeks    Status New    Target Date 08/26/20      PT LONG TERM GOAL #2   Title Pt will obtain appropriate compression bra for long term management of left breast lymphedema.    Time 4    Period Weeks    Status New    Target Date 08/26/20      PT LONG TERM GOAL #3   Title Pt will report a 50% decrease in swelling in L breast to decrease risk of infection.    Time 4    Period Weeks    Status New    Target Date 08/26/20      PT LONG TERM GOAL #4   Title Pt will report no tightness across L chest with end range L shoulder ROM to allow improved comfort.    Time 4    Period Weeks    Status New    Target Date 08/26/20                   Plan - 08/04/20 1153     Clinical Impression Statement Began manual lymph drainage today and instructing pt in same while performing. Also educated her in basics of anatomy of lymphatic system and principles of MLD. Had her return all steps of entire sequence and after hand over hand cuing for correct pressure with no sliding she was able to return good demo and verbalize good understanding of sequence. Also assessed pts compression bra and her current one doesn't fully cover at anterior axilla so she plans to call [facility] [place] to try on other size.    Stability/Clinical Decision Making Stable/Uncomplicated    Rehab Potential Good    PT Frequency 2x / week    PT Duration 4 weeks    PT Treatment/Interventions ADLs/Self Care Home Management;Patient/family education;Therapeutic exercise;Manual lymph drainage;Manual techniques;Scar mobilization;Taping;Vasopneumatic Device    PT Next Visit Plan Cont MLD to Lt breast  and assess pts technique    PT Home Exercise Plan Self MLD    Consulted and Agree with Plan of Care Patient              Patient will benefit from skilled therapeutic intervention in order to improve the following deficits and impairments:  Postural dysfunction, Increased fascial restricitons, Decreased scar mobility, Increased edema  Visit Diagnosis: Lymphedema, not elsewhere classified  Disorder of the skin and subcutaneous tissue related to radiation, unspecified  Abnormal posture  Malignant neoplasm of upper-outer quadrant of left breast in female, estrogen receptor positive ([place])     Problem List Patient Active Problem List   Diagnosis Date Noted   [place] 07/14/2020   Malignant neoplasm of overlapping sites of left breast in female, estrogen receptor positive ([place]) 05/18/2020   Genetic testing 04/07/2020   Hypothyroidism    Anxiety    Allergies    FH: thoracic aortic aneurysm 03/23/2020   Family history of breast cancer    Family history of ovarian cancer    Family history of bladder cancer    Family history of prostate cancer    Breast cancer in female ([place]) 03/09/2020   Bilateral dry eyes 01/27/2019   Cervical cancer screening 09/12/2016   Hx of colonic polyp 09/12/2016   Colon polyp 09/12/2016   Preventative health care 07/23/2015   Asthma    Thyroid disease    Bipolar affective ([place])    Urinary incontinence 12/25/2011    Otelia Limes, PTA 08/04/2020, 11:59 AM  [place], [place], [address] Phone: [phone]   Fax:  [phone]  Name: Cheyenne Garner MRN: [id number] Date of Birth: [birth year]

## 2020-08-05 ENCOUNTER — Ambulatory Visit: Payer: BC Managed Care – PPO | Admitting: Physical Therapy

## 2020-08-05 ENCOUNTER — Other Ambulatory Visit: Payer: Self-pay | Admitting: Family Medicine

## 2020-08-05 ENCOUNTER — Encounter: Payer: Self-pay | Admitting: Physical Therapy

## 2020-08-05 ENCOUNTER — Encounter: Payer: Self-pay | Admitting: Family Medicine

## 2020-08-05 DIAGNOSIS — C50412 Malignant neoplasm of upper-outer quadrant of left female breast: Secondary | ICD-10-CM

## 2020-08-05 DIAGNOSIS — Z170 Estrogen receptor positive status [ER+]: Secondary | ICD-10-CM

## 2020-08-05 DIAGNOSIS — I890 Lymphedema, not elsewhere classified: Secondary | ICD-10-CM | POA: Diagnosis not present

## 2020-08-05 DIAGNOSIS — R293 Abnormal posture: Secondary | ICD-10-CM | POA: Diagnosis not present

## 2020-08-05 DIAGNOSIS — E079 Disorder of thyroid, unspecified: Secondary | ICD-10-CM

## 2020-08-05 DIAGNOSIS — L599 Disorder of the skin and subcutaneous tissue related to radiation, unspecified: Secondary | ICD-10-CM | POA: Diagnosis not present

## 2020-08-05 NOTE — Therapy (Signed)
[place], [place], [address] Phone: [phone]   Fax:  [phone]  Physical Therapy Treatment  Patient Details  Name: Cheyenne Garner MRN: [id number] Date of Birth: [birth year] Referring Provider (PT): Shona Simpson   Encounter Date: 08/05/2020   PT End of Session - 08/05/20 0956     Visit Number 3    Number of Visits 9    Date for PT Re-Evaluation 08/26/20    PT Start Time 0907    PT Stop Time 0956    PT Time Calculation (min) 49 min    Activity Tolerance Patient tolerated treatment well    Behavior During Therapy [facility] for tasks assessed/performed             Past Medical History:  Diagnosis Date   Allergies    Anxiety    Asthma    cough variant asthma developed in last couple of years   Bipolar 1 disorder ([place])    Cervical cancer screening 09/12/2016   Colon polyp 09/12/2016   Family history of bladder cancer    Family history of breast cancer    Family history of ovarian cancer    Family history of prostate cancer    Hx of colonic polyp 09/12/2016   Colonoscopy 2017 with 1 small polyp, done by Dr [name], repeat colonoscopy in 5 years, 2022   Hypothyroidism    Preventative health care 07/23/2015   Thyroid disease     Past Surgical History:  Procedure Laterality Date   APPENDECTOMY     BREAST LUMPECTOMY WITH RADIOACTIVE SEED LOCALIZATION Bilateral 02/12/2020   Procedure: BILATERAL BREAST LUMPECTOMY WITH RADIOACTIVE SEED LOCALIZATION;  Surgeon: Jovita Kussmaul, MD;  Location: [place];  Service: General;  Laterality: Bilateral;   RE-EXCISION OF BREAST LUMPECTOMY Left 03/04/2020   Procedure: LEFT BREAST MARGIN REEXCISION;  Surgeon: Jovita Kussmaul, MD;  Location: [place];  Service: General;  Laterality: Left;   SENTINEL NODE BIOPSY Left 03/04/2020   Procedure: SENTINEL NODE BIOPSY;  Surgeon: Jovita Kussmaul, MD;  Location: [place];  Service:  General;  Laterality: Left;   SKIN BIOPSY     back moles removed (precancer?)    There were no vitals filed for this visit.   Subjective Assessment - 08/05/20 0908     Subjective I have been trying to self MLD and I learned it is much lighter. I can tell the compression bra is making a big difference.    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 No/denies    Pain Score 0-No pain                               [facility] Adult PT Treatment/Exercise - 08/05/20 0001       Manual Therapy   Edema Management assessed compression bra and educated pt to try on medium to see if it has better coverage at anterior axilla    Manual Lymphatic Drainage (MLD) In Supine: Short neck, 5 diaphragmatic breaths, Rt axillary and Lt inguinal nodes, anterior inter-axillary and Lt axillo-inguinal anastomosis, then focued  on superior aspect of Lt breast redirecting towards anterior anastomosis, then into Rt S/L for further work to lateral breast redirecting towards axillo-inguinal anastomosis, then finished retracing all steps in supine and continued instructing pt in same having her return demo of each step                         PT Long Term Goals - 07/29/20 1053       PT LONG TERM GOAL #1   Title Pt will be independent in self MLD for long term management of lymphedema.    Time 4    Period Weeks    Status New    Target Date 08/26/20      PT LONG TERM GOAL #2   Title Pt will obtain appropriate compression bra for long term management of left breast lymphedema.    Time 4    Period Weeks    Status New    Target Date 08/26/20      PT LONG TERM GOAL #3   Title Pt will report a 50%  decrease in swelling in L breast to decrease risk of infection.    Time 4    Period Weeks    Status New    Target Date 08/26/20      PT LONG TERM GOAL #4   Title Pt will report no tightness across L chest with end range L shoulder ROM to allow improved comfort.    Time 4    Period Weeks    Status New    Target Date 08/26/20                   Plan - 08/05/20 0957     Clinical Impression Statement Pt returns to PT today. She reports the compression bra is really helping but she is going back today to try on a medium since the small is not adequately covering her anterior axilla. Continued instructing pt in self MLD and had pt return demonstrate proper technique. Pt required cueing to slow down speed and to not glide hands over skin but rather stretch skin gently. Pt required less verbal cueing by end of session.    PT Frequency 2x / week    PT Duration 4 weeks    PT Treatment/Interventions ADLs/Self Care Home Management;Patient/family education;Therapeutic exercise;Manual lymph drainage;Manual techniques;Scar mobilization;Taping;Vasopneumatic Device    PT Next Visit Plan Cont MLD to Lt breast  and assess pts technique, did medium bra work?    PT Home Exercise Plan Self MLD    Consulted and Agree with Plan of Care Patient             Patient will benefit from skilled therapeutic intervention in order to improve the following deficits and impairments:  Postural dysfunction, Increased fascial restricitons, Decreased scar mobility, Increased edema  Visit Diagnosis: Lymphedema, not elsewhere classified  Disorder of the skin and subcutaneous tissue related to radiation, unspecified  Abnormal posture  Malignant neoplasm of upper-outer quadrant of left breast in female, estrogen receptor positive ([place])     Problem List Patient Active Problem List   Diagnosis Date Noted   [place] 07/14/2020   Malignant neoplasm of overlapping sites of left breast in female, estrogen  receptor positive ([place]) 05/18/2020   Genetic testing 04/07/2020   Hypothyroidism    Anxiety    Allergies    FH: thoracic aortic aneurysm 03/23/2020   Family history of breast cancer    Family history of  ovarian cancer    Family history of bladder cancer    Family history of prostate cancer    Breast cancer in female [facility]) 03/09/2020   Bilateral dry eyes 01/27/2019   Cervical cancer screening 09/12/2016   Hx of colonic polyp 09/12/2016   Colon polyp 09/12/2016   Preventative health care 07/23/2015   Asthma    Thyroid disease    Bipolar affective ([place])    Urinary incontinence 12/25/2011    Allyson Sabal [facility] 08/05/2020, 10:06 AM  [place], [place], [address] Phone: [phone]   Fax:  [phone]  Name: OLUKEMI PANCHAL MRN: [id number] Date of Birth: [birth year]  Manus Gunning, PT 08/05/20 10:07 AM

## 2020-08-09 ENCOUNTER — Encounter: Payer: BC Managed Care – PPO | Admitting: Rehabilitation

## 2020-08-09 ENCOUNTER — Encounter: Payer: Self-pay | Admitting: Family Medicine

## 2020-08-09 ENCOUNTER — Telehealth: Payer: BC Managed Care – PPO | Admitting: Family Medicine

## 2020-08-10 ENCOUNTER — Other Ambulatory Visit (INDEPENDENT_AMBULATORY_CARE_PROVIDER_SITE_OTHER): Payer: BC Managed Care – PPO

## 2020-08-10 ENCOUNTER — Other Ambulatory Visit: Payer: Self-pay

## 2020-08-10 DIAGNOSIS — E079 Disorder of thyroid, unspecified: Secondary | ICD-10-CM | POA: Diagnosis not present

## 2020-08-10 LAB — TSH: TSH: 0.78 u[IU]/mL (ref 0.35–5.50)

## 2020-08-12 ENCOUNTER — Encounter: Payer: Self-pay | Admitting: Family Medicine

## 2020-08-12 ENCOUNTER — Telehealth: Payer: Self-pay | Admitting: Radiation Oncology

## 2020-08-12 ENCOUNTER — Ambulatory Visit (HOSPITAL_BASED_OUTPATIENT_CLINIC_OR_DEPARTMENT_OTHER)
Admission: RE | Admit: 2020-08-12 | Discharge: 2020-08-12 | Disposition: A | Discharge status: Home / Self Care | Payer: BC Managed Care – PPO | Source: Ambulatory Visit | Attending: Family Medicine | Admitting: Family Medicine

## 2020-08-12 ENCOUNTER — Ambulatory Visit: Payer: BC Managed Care – PPO | Admitting: Family Medicine

## 2020-08-12 ENCOUNTER — Other Ambulatory Visit: Payer: Self-pay | Admitting: Family Medicine

## 2020-08-12 ENCOUNTER — Telehealth: Payer: Self-pay | Admitting: *Deleted

## 2020-08-12 ENCOUNTER — Other Ambulatory Visit: Payer: Self-pay

## 2020-08-12 VITALS — BP 118/70 | HR 90 | Resp 16 | Wt 135.8 lb

## 2020-08-12 DIAGNOSIS — R002 Palpitations: Secondary | ICD-10-CM

## 2020-08-12 DIAGNOSIS — E039 Hypothyroidism, unspecified: Secondary | ICD-10-CM

## 2020-08-12 DIAGNOSIS — J189 Pneumonia, unspecified organism: Secondary | ICD-10-CM

## 2020-08-12 DIAGNOSIS — R5383 Other fatigue: Secondary | ICD-10-CM

## 2020-08-12 DIAGNOSIS — C50911 Malignant neoplasm of unspecified site of right female breast: Secondary | ICD-10-CM

## 2020-08-12 DIAGNOSIS — R059 Cough, unspecified: Secondary | ICD-10-CM

## 2020-08-12 DIAGNOSIS — R062 Wheezing: Secondary | ICD-10-CM

## 2020-08-12 DIAGNOSIS — C50912 Malignant neoplasm of unspecified site of left female breast: Secondary | ICD-10-CM

## 2020-08-12 DIAGNOSIS — F319 Bipolar disorder, unspecified: Secondary | ICD-10-CM

## 2020-08-12 LAB — COMPREHENSIVE METABOLIC PANEL
ALT: 9 U/L (ref 0–35)
AST: 11 U/L (ref 0–37)
Albumin: 3.7 g/dL (ref 3.5–5.2)
Alkaline Phosphatase: 69 U/L (ref 39–117)
BUN: 15 mg/dL (ref 6–23)
CO2: 31 mEq/L (ref 19–32)
Calcium: 9.4 mg/dL (ref 8.4–10.5)
Chloride: 103 mEq/L (ref 96–112)
Creatinine, Ser: 0.85 mg/dL (ref 0.40–1.20)
GFR: 77.1 mL/min (ref >60.00)
Glucose, Bld: 103 mg/dL — ABNORMAL HIGH (ref 70–99)
Potassium: 4.1 mEq/L (ref 3.5–5.1)
Sodium: 141 mEq/L (ref 135–145)
Total Bilirubin: 0.4 mg/dL (ref 0.2–1.2)
Total Protein: 6.5 g/dL (ref 6.0–8.3)

## 2020-08-12 LAB — CBC WITH DIFFERENTIAL/PLATELET
Basophils Absolute: 0 10*3/uL (ref 0.0–0.1)
Basophils Relative: 0.6 % (ref 0.0–3.0)
Eosinophils Absolute: 0.1 10*3/uL (ref 0.0–0.7)
Eosinophils Relative: 1.5 % (ref 0.0–5.0)
HCT: 37.4 % (ref 36.0–46.0)
Hemoglobin: 12.5 g/dL (ref 12.0–15.0)
Lymphocytes Relative: 11 % — ABNORMAL LOW (ref 12.0–46.0)
Lymphs Abs: 0.6 10*3/uL — ABNORMAL LOW (ref 0.7–4.0)
MCHC: 33.4 g/dL (ref 30.0–36.0)
MCV: 91.3 fl (ref 78.0–100.0)
Monocytes Absolute: 0.6 10*3/uL (ref 0.1–1.0)
Monocytes Relative: 10.9 % (ref 3.0–12.0)
Neutro Abs: 3.9 10*3/uL (ref 1.4–7.7)
Neutrophils Relative %: 76 % (ref 43.0–77.0)
Platelets: 153 10*3/uL (ref 150.0–400.0)
RBC: 4.09 Mil/uL (ref 3.87–5.11)
RDW: 11.6 % (ref 11.5–15.5)
WBC: 5.1 10*3/uL (ref 4.0–10.5)

## 2020-08-12 LAB — SEDIMENTATION RATE: Sed Rate: 68 mm/hr — ABNORMAL HIGH (ref 0–30)

## 2020-08-12 MED ORDER — AMOXICILLIN-POT CLAVULANATE 875-125 MG PO TABS: 1.0000 | ORAL_TABLET | Freq: Two times a day (BID) | ORAL | 0 refills | Status: DC

## 2020-08-12 MED ORDER — ALBUTEROL SULFATE (2.5 MG/3ML) 0.083% IN NEBU
2.5000 mg | INHALATION_SOLUTION | Freq: Four times a day (QID) | RESPIRATORY_TRACT | 1 refills | Status: DC | PRN
Start: 2020-08-12 — End: 2020-11-08

## 2020-08-12 NOTE — Telephone Encounter (Signed)
please let Cheyenne Garner know oncology wants [place] to repeat her CXR in 2-3 weeks to confirm resolution so I have placed the order for 8/11 and she can go any time in that next week.

## 2020-08-12 NOTE — Patient Instructions (Signed)

## 2020-08-12 NOTE — Telephone Encounter (Signed)
I spoke with the patient and she has been diagnosed with a LUL pneumonia after about 2 weeks of progressive weakness, recent weezing, and fever. She has tested negative with rapid and PCR testing and remains negative for covid. She is also having blood work done by her PCP Dr. Charlett Blake. A CXR identified the opacity in the LUL and she was started on antibiotics. I will review with Dr. Tammi Klippel as well.

## 2020-08-13 LAB — CMV ABS, IGG+IGM (CYTOMEGALOVIRUS)
CMV IgM: <30 AU/mL
Cytomegalovirus Ab-IgG: <0.6 U/mL

## 2020-08-13 LAB — EPSTEIN-BARR VIRUS NUCLEAR ANTIGEN ANTIBODY, IGG: EBV NA IgG: <18 U/mL

## 2020-08-13 LAB — SARS-COV-2 ANTIBODIES: SARS-CoV-2 Antibodies: NEGATIVE

## 2020-08-13 NOTE — Telephone Encounter (Signed)
See my chart message

## 2020-08-15 DIAGNOSIS — J189 Pneumonia, unspecified organism: Secondary | ICD-10-CM | POA: Insufficient documentation

## 2020-08-15 NOTE — Assessment & Plan Note (Addendum)
She presented with heavy fatigue for several weeks and has now developed some wheezing and fever. Xray confirms pneumonia so she is treated with Augmentin and will repeat CXR in 2-3 weeks. Oncology will consider pneumonitiis if she does not improve. Labwork was largely negative except for elevated sed rate. Spent 20 minutes in review of patient history, symptoms and plan of care.

## 2020-08-15 NOTE — Progress Notes (Signed)
Subjective:    Patient ID: Cheyenne Garner, female    DOB: 1965-08-03, 55 y.o.   MRN: [id number]  Chief Complaint  Patient presents with   Fatigue   Wheezing   Diabetes   Hyperlipidemia   Osteoarthritis    HPI Patient is in today for evaluation of persistent fatigue. She has been feeling poorly and excessively fatigued for several weeks and more recently has developed wheezing, fever and cough. She had noticed some swelling in her left breast also but has that evaluated by oncology. She also notes some sneezing and congestion. Denies CP/palp/SOB/HA/GI or GU c/o. Taking meds as prescribed   Past Medical History:  Diagnosis Date   Allergies    Anxiety    Asthma    cough variant asthma developed in last couple of years   Bipolar 1 disorder ([place])    Cervical cancer screening 09/12/2016   Colon polyp 09/12/2016   Family history of bladder cancer    Family history of breast cancer    Family history of ovarian cancer    Family history of prostate cancer    Hx of colonic polyp 09/12/2016   Colonoscopy 2017 with 1 small polyp, done by Dr [name], repeat colonoscopy in 5 years, 2022   Hypothyroidism    Preventative health care 07/23/2015   Thyroid disease     Past Surgical History:  Procedure Laterality Date   APPENDECTOMY     BREAST LUMPECTOMY WITH RADIOACTIVE SEED LOCALIZATION Bilateral 02/12/2020   Procedure: BILATERAL BREAST LUMPECTOMY WITH RADIOACTIVE SEED LOCALIZATION;  Surgeon: Jovita Kussmaul, MD;  Location: [facility];  Service: General;  Laterality: Bilateral;   RE-EXCISION OF BREAST LUMPECTOMY Left 03/04/2020   Procedure: LEFT BREAST MARGIN REEXCISION;  Surgeon: Jovita Kussmaul, MD;  Location: [facility];  Service: General;  Laterality: Left;   SENTINEL NODE BIOPSY Left 03/04/2020   Procedure: SENTINEL NODE BIOPSY;  Surgeon: Jovita Kussmaul, MD;  Location: [facility];  Service: General;  Laterality: Left;   SKIN BIOPSY     back moles  removed (precancer?)    Family History  Problem Relation Age of Onset   Arthritis Mother    Dementia Mother    Hypertension Father    Heart disease Father        MI, s/p triple bypass at age 36   Neuropathy Father        toxic peripheral    Cancer Paternal Grandfather        young, lung cancer?, heavy smoker   Cholelithiasis Sister    Aortic aneurysm Sister 6   Aneurysm Sister        descending aortic    Mental retardation Sister        ADD, schizoaffective d/o   Obesity Sister    Arthritis Brother        back disease   Ovarian cancer Cousin 85       maternal second cousin (MGF's great-niece)   Dementia Maternal Grandmother    Osteoarthritis Maternal Grandmother    Macular degeneration Maternal Grandfather    Osteoarthritis Maternal Grandfather    Congestive Heart Failure Paternal Grandmother    Breast cancer Cousin 21       paternal first cousin   Prostate cancer Paternal Uncle        spread to bladder, d. 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 No major dietary restrictions   Social Determinants of Radio broadcast assistant Strain: Not on file  Food Insecurity: Not on file  Transportation Needs: Not on file  Physical Activity: Not on file  Stress: Not on file  Social Connections: Not on file  Intimate Partner Violence: Not on file    Outpatient  Medications Prior to Visit  Medication Sig Dispense Refill   albuterol (VENTOLIN HFA) 108 (90 Base) MCG/ACT inhaler Inhale 2 puffs into the lungs every 6 (six) hours as needed for wheezing or shortness of breath. 1 Inhaler 2   Cholecalciferol (VITAMIN D-3) 125 MCG (5000 UT) TABS Take 1 tablet by mouth daily at 6 (six) AM.     cycloSPORINE (RESTASIS) 0.05 % ophthalmic emulsion Place 1 drop into both eyes 2 (two) times daily.     lithium 300 MG tablet Take 300 mg by mouth 2 (two) times daily.     Probiotic Product (PROBIOTIC-10 PO) Take 1 capsule by mouth daily.     thyroid (ARMOUR) 120 MG tablet Take 120 mg by mouth daily before breakfast. PATIENT TAKES 90MG ON MON-WED-FRI AND 120MG ON TUE-THURS-SAT     Zinc 50 MG TABS Take 50 mg by mouth daily.     No facility-administered medications prior to visit.    Allergies  Allergen Reactions   Gentamycin [Gentamicin]     Reacted to eye ointment, swelling red painful eyes    Review of Systems  Constitutional:  Positive for fever and malaise/fatigue. Negative for chills.  HENT:  Positive for congestion.   Eyes:  Negative for blurred vision.  Respiratory:  Positive for cough and wheezing. Negative for shortness of breath.   Cardiovascular:  Negative for chest pain, palpitations and leg swelling.  Gastrointestinal:  Negative for abdominal pain, blood in stool and nausea.  Genitourinary:  Negative for dysuria and frequency.  Musculoskeletal:  Negative for falls.  Skin:  Negative for rash.  Neurological:  Negative for dizziness, loss of consciousness and headaches.  Endo/Heme/Allergies:  Negative for environmental allergies.  Psychiatric/Behavioral:  Negative for depression. The patient is not nervous/anxious.       Objective:    Physical Exam Constitutional:      General: She is not in acute distress.    Appearance: She is well-developed.  HENT:     Head: Normocephalic and atraumatic.  Eyes:     Conjunctiva/sclera: Conjunctivae normal.   Neck:     Thyroid: No thyromegaly.  Cardiovascular:     Rate and Rhythm: Normal rate and regular rhythm.     Heart sounds: Normal heart sounds. No murmur heard. Pulmonary:     Effort: Pulmonary effort is normal. No respiratory distress.     Breath sounds: Normal breath sounds.  Abdominal:     General: Bowel sounds are normal. There is no distension.  Palpations: Abdomen is soft. There is no mass.     Tenderness: There is no abdominal tenderness.  Musculoskeletal:     Cervical back: Neck supple.  Lymphadenopathy:     Cervical: No cervical adenopathy.  Skin:    General: Skin is warm and dry.  Neurological:     Mental Status: She is alert and oriented to person, place, and time.  Psychiatric:        Behavior: Behavior normal.    BP 118/70   Pulse 90   Resp 16   Wt 135 lb 12.8 oz (61.6 kg)   SpO2 99%   BMI 21.92 kg/m  Wt Readings from Last 3 Encounters:  08/12/20 135 lb 12.8 oz (61.6 kg)  07/14/20 141 lb (64 kg)  05/18/20 138 lb 6.4 oz (62.8 kg)    Diabetic Foot Exam - Simple   No data filed    Lab Results  Component Value Date   WBC 5.1 08/12/2020   HGB 12.5 08/12/2020   HCT 37.4 08/12/2020   PLT 153.0 08/12/2020   GLUCOSE 103 (H) 08/12/2020   CHOL 153 03/18/2020   TRIG 53.0 03/18/2020   HDL 65.00 03/18/2020   LDLCALC 78 03/18/2020   ALT 9 08/12/2020   AST 11 08/12/2020   NA 141 08/12/2020   K 4.1 08/12/2020   CL 103 08/12/2020   CREATININE 0.85 08/12/2020   BUN 15 08/12/2020   CO2 31 08/12/2020   TSH 0.78 08/10/2020    Lab Results  Component Value Date   TSH 0.78 08/10/2020   Lab Results  Component Value Date   WBC 5.1 08/12/2020   HGB 12.5 08/12/2020   HCT 37.4 08/12/2020   MCV 91.3 08/12/2020   PLT 153.0 08/12/2020   Lab Results  Component Value Date   NA 141 08/12/2020   K 4.1 08/12/2020   CO2 31 08/12/2020   GLUCOSE 103 (H) 08/12/2020   BUN 15 08/12/2020   CREATININE 0.85 08/12/2020   BILITOT 0.4 08/12/2020   ALKPHOS 69  08/12/2020   AST 11 08/12/2020   ALT 9 08/12/2020   PROT 6.5 08/12/2020   ALBUMIN 3.7 08/12/2020   CALCIUM 9.4 08/12/2020   ANIONGAP 10 03/16/2020   EGFR 73 04/21/2020   GFR 77.10 08/12/2020   Lab Results  Component Value Date   CHOL 153 03/18/2020   Lab Results  Component Value Date   HDL 65.00 03/18/2020   Lab Results  Component Value Date   LDLCALC 78 03/18/2020   Lab Results  Component Value Date   TRIG 53.0 03/18/2020   Lab Results  Component Value Date   CHOLHDL 2 03/18/2020   No results found for: HGBA1C     Assessment & Plan:   Problem List Items Addressed This Visit     Bipolar affective ([place])    Stable on current dose of Lithium       Breast cancer in female [facility])    She is following closely with oncology and has been tolerated her treatments well       Hypothyroidism    On Levothyroxine, continue to monitor       Left upper lobe pneumonia    She presented with heavy fatigue for several weeks and has now developed some wheezing and fever. Xray confirms pneumonia so she is treated with Augmentin and will repeat CXR in 2-3 weeks. Oncology will consider pneumonitiis if she does not improve. Labwork was largely negative except for elevated sed rate. Spent 20 minutes  in review of patient history, symptoms and plan of care.       Relevant Medications   albuterol (PROVENTIL) (2.5 MG/3ML) 0.083% nebulizer solution   Other Visit Diagnoses     Fatigue, unspecified type    -  Primary   Relevant Orders   CMV abs, IgG+IgM (cytomegalovirus) (Completed)   SARS-CoV-2 Antibodies (Completed)   CBC with Differential/Platelet (Completed)   Comprehensive metabolic panel (Completed)   Sedimentation rate (Completed)   DG Chest 2 View (Completed)   Epstein-Barr virus nuclear antigen antibody, IgG (Completed)   Wheezing       Relevant Orders   CMV abs, IgG+IgM (cytomegalovirus) (Completed)   SARS-CoV-2 Antibodies (Completed)   CBC with Differential/Platelet  (Completed)   Comprehensive metabolic panel (Completed)   Sedimentation rate (Completed)   DG Chest 2 View (Completed)   Epstein-Barr virus nuclear antigen antibody, IgG (Completed)   Palpitations       Relevant Orders   CBC with Differential/Platelet (Completed)   Comprehensive metabolic panel (Completed)   Sedimentation rate (Completed)   DG Chest 2 View (Completed)   EKG 12-Lead (Completed)   Epstein-Barr virus nuclear antigen antibody, IgG (Completed)   Cough       Relevant Orders   DG Chest 2 View (Completed)   Epstein-Barr virus nuclear antigen antibody, IgG (Completed)       I am having Cheyenne Garner start on albuterol. I am also having her maintain her lithium, Probiotic Product (PROBIOTIC-10 PO), albuterol, thyroid, cycloSPORINE, Vitamin D-3, and Zinc.  Meds ordered this encounter  Medications   albuterol (PROVENTIL) (2.5 MG/3ML) 0.083% nebulizer solution    Sig: Take 3 mLs (2.5 mg total) by nebulization every 6 (six) hours as needed for wheezing or shortness of breath.    Dispense:  150 mL    Refill:  1     Penni Homans, MD

## 2020-08-15 NOTE — Assessment & Plan Note (Signed)
On Levothyroxine, continue to monitor 

## 2020-08-15 NOTE — Assessment & Plan Note (Signed)
She is following closely with oncology and has been tolerated her treatments well

## 2020-08-15 NOTE — Assessment & Plan Note (Signed)
Stable on current dose of Lithium

## 2020-08-16 DIAGNOSIS — E039 Hypothyroidism, unspecified: Secondary | ICD-10-CM | POA: Diagnosis not present

## 2020-08-16 DIAGNOSIS — M8580 Other specified disorders of bone density and structure, unspecified site: Secondary | ICD-10-CM | POA: Diagnosis not present

## 2020-08-16 DIAGNOSIS — J189 Pneumonia, unspecified organism: Secondary | ICD-10-CM | POA: Diagnosis not present

## 2020-08-16 DIAGNOSIS — J45909 Unspecified asthma, uncomplicated: Secondary | ICD-10-CM | POA: Diagnosis not present

## 2020-08-16 MED ORDER — QVAR REDIHALER 80 MCG/ACT IN AERB: 1.0000 | INHALATION_SPRAY | Freq: Two times a day (BID) | RESPIRATORY_TRACT | 0 refills | Status: DC

## 2020-08-16 NOTE — Addendum Note (Signed)
Addended by: Lamar Blinks C on: 08/16/2020 06:04 PM   Modules accepted: Orders

## 2020-08-18 ENCOUNTER — Telehealth: Payer: Self-pay | Admitting: *Deleted

## 2020-08-18 ENCOUNTER — Ambulatory Visit: Payer: BC Managed Care – PPO | Admitting: Physical Therapy

## 2020-08-18 ENCOUNTER — Ambulatory Visit: Payer: BC Managed Care – PPO | Admitting: Family Medicine

## 2020-08-18 ENCOUNTER — Encounter: Payer: Self-pay | Admitting: Family Medicine

## 2020-08-18 ENCOUNTER — Other Ambulatory Visit: Payer: Self-pay

## 2020-08-18 VITALS — BP 118/82 | HR 94 | Temp 98.2°F | Resp 17 | Ht 66.0 in | Wt 136.0 lb

## 2020-08-18 DIAGNOSIS — J189 Pneumonia, unspecified organism: Secondary | ICD-10-CM | POA: Diagnosis not present

## 2020-08-18 MED ORDER — AMOXICILLIN-POT CLAVULANATE 875-125 MG PO TABS: 1.0000 | ORAL_TABLET | Freq: Two times a day (BID) | ORAL | 0 refills | Status: AC

## 2020-08-18 MED ORDER — PREDNISONE 20 MG PO TABS: ORAL_TABLET | ORAL | 0 refills | Status: DC

## 2020-08-18 NOTE — Telephone Encounter (Signed)
Spoke with Mrs. Liendo to see if she was feeling better.  She reports she is feeling a little better today.  She has not has fever in 24 hours, she has occasional productive cough with clear phlegm, and continues to have fatigue.  She is taking Augmentin and feels this is not really working.  She was started on beclomethasone inhaler 8/1.  She is going for repeat chest xray on 08/30/2020.  We will check in with her when she returns from vacation and we have results from her chest xray.  Gloriajean Dell. Leonie Green, BSN

## 2020-08-18 NOTE — Patient Instructions (Signed)
Good to see you today- take care and enjoy your vacation Let's have you take 20- 40 mg of prednisone for up to 5 days.  If any sign of mania stop medication!  Better to take in the am so it does not keep you up at night Continue the augmentin (antibiotic) for an extra 3 days -longer if you like Let us know if you are not improving

## 2020-08-18 NOTE — Progress Notes (Signed)
[place] at [facility] [address] [place], [place] [address] [phone]  Date:  08/18/2020   Name:  Cheyenne Garner   DOB:  04-10-1965   MRN:  [id number]  PCP:  Mosie Lukes, MD    Chief Complaint: Cough (Wheezing, cough, headache, hx pneumonia/)   History of Present Illness:  Cheyenne Garner is a 55 y.o. very pleasant female patient who presents with the following:  Pt of Dr Charlett Blake- recently dx with pneumonia on 7/28 She was treated with augmentin   CXR 7/28:  CHEST - 2 VIEW COMPARISON:  03/23/2020 FINDINGS: Airspace disease in the left upper lobe is new since prior study compatible with pneumonia. Right lung clear. Heart is normal size. No effusions. No acute bony abnormality. IMPRESSION: Left upper lobe opacity compatible with pneumonia.   Pt notes that she is feeling about the same -she got concerned as she is not feeling that much better yet She feels tired and weak  She has some cough- "all the time" but perhaps less intense than it was  Never had pneumonia in the past  She did have a fever up until yesterday Her temp might be up to 100.6 the last few days -was higher at the start of illness.  Fever seems to have finally resolved  She was dx with L breast cancer just in February; so far she had lumpectomy x 2 She does have aortic root dilatation  She has radiation x20- she finished this late April   No mania in about 13 years -she is stable on lithium    Patient Active Problem List   Diagnosis Date Noted   Left upper lobe pneumonia 08/15/2020   [place] 07/14/2020   Malignant neoplasm of overlapping sites of left breast in female, estrogen receptor positive ([place]) 05/18/2020   Genetic testing 04/07/2020   Hypothyroidism    Anxiety    Allergies    FH: thoracic aortic aneurysm 03/23/2020   Family history of breast cancer    Family history of ovarian cancer    Family history of bladder cancer    Family  history of prostate cancer    Breast cancer in female ([place]) 03/09/2020   Bilateral dry eyes 01/27/2019   Cervical cancer screening 09/12/2016   Hx of colonic polyp 09/12/2016   Colon polyp 09/12/2016   Preventative health care 07/23/2015   Asthma    Thyroid disease    Bipolar affective ([place])    Urinary incontinence 12/25/2011    Past Medical History:  Diagnosis Date   Allergies    Anxiety    Asthma    cough variant asthma developed in last couple of years   Bipolar 1 disorder ([place])    Cervical cancer screening 09/12/2016   Colon polyp 09/12/2016   Family history of bladder cancer    Family history of breast cancer    Family history of ovarian cancer    Family history of prostate cancer    Hx of colonic polyp 09/12/2016   Colonoscopy 2017 with 1 small polyp, done by Dr [name], repeat colonoscopy in 5 years, 2022   Hypothyroidism    Preventative health care 07/23/2015   Thyroid disease     Past Surgical History:  Procedure Laterality Date   APPENDECTOMY     BREAST LUMPECTOMY WITH RADIOACTIVE SEED LOCALIZATION Bilateral 02/12/2020   Procedure: BILATERAL BREAST LUMPECTOMY WITH RADIOACTIVE SEED LOCALIZATION;  Surgeon: Jovita Kussmaul, MD;  Location:  [facility];  Service: General;  Laterality: Bilateral;   RE-EXCISION OF BREAST LUMPECTOMY Left 03/04/2020   Procedure: LEFT BREAST MARGIN REEXCISION;  Surgeon: Jovita Kussmaul, MD;  Location: [facility];  Service: General;  Laterality: Left;   SENTINEL NODE BIOPSY Left 03/04/2020   Procedure: SENTINEL NODE BIOPSY;  Surgeon: Jovita Kussmaul, MD;  Location: [facility];  Service: General;  Laterality: Left;   SKIN BIOPSY     back moles removed (precancer?)    Social History   Tobacco Use   Smoking status: Never   Smokeless tobacco: Never  Substance Use Topics   Alcohol use: Yes    Comment: social   Drug use: No    Family History  Problem Relation Age of Onset   Arthritis Mother     Dementia Mother    Hypertension Father    Heart disease Father        MI, s/p triple bypass at age 58   Neuropathy Father        toxic peripheral    Cancer Paternal Grandfather        young, lung cancer?, heavy smoker   Cholelithiasis Sister    Aortic aneurysm Sister 64   Aneurysm Sister        descending aortic    Mental retardation Sister        ADD, schizoaffective d/o   Obesity Sister    Arthritis Brother        back disease   Ovarian cancer Cousin 55       maternal second cousin (MGF's great-niece)   Dementia Maternal Grandmother    Osteoarthritis Maternal Grandmother    Macular degeneration Maternal Grandfather    Osteoarthritis Maternal Grandfather    Congestive Heart Failure Paternal Grandmother    Breast cancer Cousin 54       paternal first cousin   Prostate cancer Paternal Uncle        spread to bladder, d. 62s/70s   Cancer Niece 11       soft tissue sarcoma    Allergies  Allergen Reactions   Gentamycin [Gentamicin]     Reacted to eye ointment, swelling red painful eyes    Medication list has been reviewed and updated.  Current Outpatient Medications on File Prior to Visit  Medication Sig Dispense Refill   albuterol (PROVENTIL) (2.5 MG/3ML) 0.083% nebulizer solution Take 3 mLs (2.5 mg total) by nebulization every 6 (six) hours as needed for wheezing or shortness of breath. 150 mL 1   albuterol (VENTOLIN HFA) 108 (90 Base) MCG/ACT inhaler Inhale 2 puffs into the lungs every 6 (six) hours as needed for wheezing or shortness of breath. 1 Inhaler 2   beclomethasone (QVAR REDIHALER) 80 MCG/ACT inhaler Inhale 1 puff into the lungs 2 (two) times daily. 1 each 0   Cholecalciferol (VITAMIN D-3) 125 MCG (5000 UT) TABS Take 1 tablet by mouth daily at 6 (six) AM.     cycloSPORINE (RESTASIS) 0.05 % ophthalmic emulsion Place 1 drop into both eyes 2 (two) times daily.     lithium 300 MG tablet Take 300 mg by mouth 2 (two) times daily.     Probiotic Product (PROBIOTIC-10  PO) Take 1 capsule by mouth daily.     thyroid (NP THYROID) 90 MG tablet Take 90 mg by mouth daily.     Zinc 50 MG TABS Take 50 mg by mouth daily.     No current facility-administered medications on file prior  to visit.    Review of Systems:  As per HPI- otherwise negative.   Physical Examination: Vitals:   08/18/20 1344  BP: 118/82  Pulse: 94  Resp: 17  Temp: 98.2 F (36.8 C)  SpO2: 97%   Vitals:   08/18/20 1344  Weight: 136 lb (61.7 kg)  Height: '5\' 6"'$  (1.676 m)   Body mass index is 21.95 kg/m. Ideal Body Weight: Weight in (lb) to have BMI = 25: 154.6  GEN: no acute distress.  Normal weight, looks well HEENT: Atraumatic, Normocephalic.  Ears and Nose: No external deformity. CV: RRR, No M/G/R. No JVD. No thrill. No extra heart sounds. PULM: CTA B, no wheezes, crackles, rhonchi. No retractions. No resp. distress. No accessory muscle use. ABD: S, NT, ND, +BS. No rebound. No HSM. EXTR: No c/c/e PSYCH: Normally interactive. Conversant.    Assessment and Plan: Community acquired pneumonia of left upper lobe of lung - Plan: predniSONE (DELTASONE) 20 MG tablet, amoxicillin-clavulanate (AUGMENTIN) 875-125 MG tablet  Patient seen today for follow-up of community-acquired pneumonia of the left lung.  She was started on Augmentin almost a week ago and feels that she has not made much progress.  I suggested that we repeat a chest x-ray and potentially change antibiotics.  At this time she does not feel comfortable doing either of these things.  She would really like to try an oral steroid.  I advised that we can do this, but we will need to watch her carefully for any signs of mania given her bipolar disorder.  She agrees and will monitor herself closely-if any signs or symptoms of mania she will stop the prednisone and seek care immediately. We also decided to extend her Augmentin for least 3 days, I gave her a week of medication in case she wants to take it longer  She will keep  [place] posted about her symptoms  This visit occurred during the SARS-CoV-2 public health emergency.  Safety protocols were in place, including screening questions prior to the visit, additional usage of staff PPE, and extensive cleaning of exam room while observing appropriate contact time as indicated for disinfecting solutions.   Signed Lamar Blinks, MD

## 2020-08-30 ENCOUNTER — Ambulatory Visit: Payer: BC Managed Care – PPO | Attending: General Surgery | Admitting: Physical Therapy

## 2020-08-30 ENCOUNTER — Ambulatory Visit (HOSPITAL_BASED_OUTPATIENT_CLINIC_OR_DEPARTMENT_OTHER)
Admission: RE | Admit: 2020-08-30 | Discharge: 2020-08-30 | Disposition: A | Discharge status: Home / Self Care | Payer: BC Managed Care – PPO | Source: Ambulatory Visit | Attending: Family Medicine | Admitting: Family Medicine

## 2020-08-30 ENCOUNTER — Other Ambulatory Visit: Payer: Self-pay

## 2020-08-30 ENCOUNTER — Encounter: Payer: Self-pay | Admitting: Physical Therapy

## 2020-08-30 DIAGNOSIS — R293 Abnormal posture: Secondary | ICD-10-CM

## 2020-08-30 DIAGNOSIS — Z170 Estrogen receptor positive status [ER+]: Secondary | ICD-10-CM | POA: Diagnosis not present

## 2020-08-30 DIAGNOSIS — I890 Lymphedema, not elsewhere classified: Secondary | ICD-10-CM | POA: Diagnosis not present

## 2020-08-30 DIAGNOSIS — C50412 Malignant neoplasm of upper-outer quadrant of left female breast: Secondary | ICD-10-CM | POA: Diagnosis not present

## 2020-08-30 DIAGNOSIS — R918 Other nonspecific abnormal finding of lung field: Secondary | ICD-10-CM | POA: Diagnosis not present

## 2020-08-30 DIAGNOSIS — J189 Pneumonia, unspecified organism: Secondary | ICD-10-CM | POA: Diagnosis not present

## 2020-08-30 DIAGNOSIS — Z483 Aftercare following surgery for neoplasm: Secondary | ICD-10-CM | POA: Diagnosis not present

## 2020-08-30 DIAGNOSIS — M25612 Stiffness of left shoulder, not elsewhere classified: Secondary | ICD-10-CM

## 2020-08-30 DIAGNOSIS — L599 Disorder of the skin and subcutaneous tissue related to radiation, unspecified: Secondary | ICD-10-CM

## 2020-08-30 NOTE — Therapy (Signed)
[place], [place], [address] Phone: [phone]   Fax:  [phone]  Physical Therapy Treatment  Patient Details  Name: Cheyenne Garner MRN: [id number] Date of Birth: November 08, 1965 Referring Provider (PT): Shona Simpson   Encounter Date: 08/30/2020   PT End of Session - 08/30/20 0821     Visit Number 4    Number of Visits 12    Date for PT Re-Evaluation 09/27/20    PT Start Time 0810   pt arrived late   PT Stop Time 0854    PT Time Calculation (min) 44 min    Activity Tolerance Patient tolerated treatment well    Behavior During Therapy [facility] for tasks assessed/performed             Past Medical History:  Diagnosis Date   Allergies    Anxiety    Asthma    cough variant asthma developed in last couple of years   Bipolar 1 disorder ([place])    Cervical cancer screening 09/12/2016   Colon polyp 09/12/2016   Family history of bladder cancer    Family history of breast cancer    Family history of ovarian cancer    Family history of prostate cancer    Hx of colonic polyp 09/12/2016   Colonoscopy 2017 with 1 small polyp, done by Dr [name], repeat colonoscopy in 5 years, 2022   Hypothyroidism    Preventative health care 07/23/2015   Thyroid disease     Past Surgical History:  Procedure Laterality Date   APPENDECTOMY     BREAST LUMPECTOMY WITH RADIOACTIVE SEED LOCALIZATION Bilateral 02/12/2020   Procedure: BILATERAL BREAST LUMPECTOMY WITH RADIOACTIVE SEED LOCALIZATION;  Surgeon: Jovita Kussmaul, MD;  Location: [place];  Service: General;  Laterality: Bilateral;   RE-EXCISION OF BREAST LUMPECTOMY Left 03/04/2020   Procedure: LEFT BREAST MARGIN REEXCISION;  Surgeon: Jovita Kussmaul, MD;  Location: [place];  Service: General;  Laterality: Left;   SENTINEL NODE BIOPSY Left 03/04/2020   Procedure: SENTINEL NODE BIOPSY;  Surgeon: Jovita Kussmaul, MD;  Location: [place];  Service: General;  Laterality: Left;   SKIN BIOPSY     back moles removed (precancer?)    There were no vitals filed for this visit.   Subjective Assessment - 08/30/20 0813     Subjective I had pnuemonia and I felt terrible. I had a fever for a week. I finally started feeling better after taking prednisone. The swelling has gone down and I have not done any massage and I could not wear my compression bra because I had trouble breathing.    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 No/denies    Pain Score 0-No pain                               [facility] Adult PT Treatment/Exercise - 08/30/20 0001       Manual Therapy   Myofascial Release to cording in L axilla which is palpable from axilla to upper arm  PT Long Term Goals - 08/30/20 0819       PT LONG TERM GOAL #1   Title Pt will be independent in self MLD for long term management of lymphedema.    Baseline 08/30/20- pt is independent    Time 4    Period Weeks    Status Achieved      PT LONG TERM GOAL #2   Title Pt will obtain appropriate compression bra for long term management of left breast lymphedema.    Baseline 08/30/20- pt has received a compression bra    Time 4    Period Weeks    Status Achieved      PT LONG TERM GOAL #3   Title Pt will report a 50% decrease in swelling in L breast to decrease risk of infection.    Baseline 08/30/20- 75% decrease    Time 4    Period Weeks    Status Achieved      PT LONG TERM GOAL #4   Title Pt will report no tightness across L chest with end range L shoulder ROM to allow improved comfort.    Baseline 08/30/20- pt is continuing to  have this    Time 4    Period Weeks    Status On-going                   Plan - 08/30/20 0905     Clinical Impression Statement Pt retuns after having to cancel numerous appointments due to pneumonia. She is feeling better now and reports that her breast swelling is improving and has gone down at least 75%. Pt has met all of her goals for therapy except the goal to decrease L axillary tightness at end range L shoulder ROM. Pt fell prior to developing pneumonia when stepping out of the tub and injured her LUE which is most likely what led to the cording. Pt would benefit from myofascial release to L axilla to decrease cording and decrease tightness at end range.    PT Frequency 2x / week    PT Duration 4 weeks    PT Treatment/Interventions ADLs/Self Care Home Management;Patient/family education;Therapeutic exercise;Manual lymph drainage;Manual techniques;Scar mobilization;Taping;Vasopneumatic Device    PT Next Visit Plan MFR to cording in L axilla, MLD PRN to L breast    PT Home Exercise Plan Self MLD    Consulted and Agree with Plan of Care Patient             Patient will benefit from skilled therapeutic intervention in order to improve the following deficits and impairments:  Postural dysfunction, Increased fascial restricitons, Decreased scar mobility, Increased edema  Visit Diagnosis: Stiffness of left shoulder, not elsewhere classified  Lymphedema, not elsewhere classified  Disorder of the skin and subcutaneous tissue related to radiation, unspecified  Abnormal posture  Aftercare following surgery for neoplasm  Malignant neoplasm of upper-outer quadrant of left breast in female, estrogen receptor positive ([place])     Problem List Patient Active Problem List   Diagnosis Date Noted   Left upper lobe pneumonia 08/15/2020   [place] 07/14/2020   Malignant neoplasm of overlapping sites of left breast in female, estrogen receptor positive ([place]) 05/18/2020   Genetic  testing 04/07/2020   Hypothyroidism    Anxiety    Allergies    FH: thoracic aortic aneurysm 03/23/2020   Family history of breast cancer    Family history of ovarian cancer    Family history of bladder cancer    Family history  of prostate cancer    Breast cancer in female [facility]) 03/09/2020   Bilateral dry eyes 01/27/2019   Cervical cancer screening 09/12/2016   Hx of colonic polyp 09/12/2016   Colon polyp 09/12/2016   Preventative health care 07/23/2015   Asthma    Thyroid disease    Bipolar affective ([place])    Urinary incontinence 12/25/2011    Allyson Sabal [facility] 08/30/2020, 9:07 AM  [place], [place], [address] Phone: [phone]   Fax:  [phone]  Name: Cheyenne Garner MRN: [id number] Date of Birth: 26-Oct-1965   Manus Gunning, PT 08/30/20 9:07 AM

## 2020-09-01 ENCOUNTER — Other Ambulatory Visit: Payer: Self-pay

## 2020-09-01 ENCOUNTER — Ambulatory Visit: Payer: BC Managed Care – PPO | Admitting: Physical Therapy

## 2020-09-01 ENCOUNTER — Encounter: Payer: Self-pay | Admitting: Physical Therapy

## 2020-09-01 DIAGNOSIS — Z170 Estrogen receptor positive status [ER+]: Secondary | ICD-10-CM

## 2020-09-01 DIAGNOSIS — M25612 Stiffness of left shoulder, not elsewhere classified: Secondary | ICD-10-CM

## 2020-09-01 DIAGNOSIS — C50412 Malignant neoplasm of upper-outer quadrant of left female breast: Secondary | ICD-10-CM

## 2020-09-01 DIAGNOSIS — I890 Lymphedema, not elsewhere classified: Secondary | ICD-10-CM | POA: Diagnosis not present

## 2020-09-01 DIAGNOSIS — Z483 Aftercare following surgery for neoplasm: Secondary | ICD-10-CM

## 2020-09-01 DIAGNOSIS — R293 Abnormal posture: Secondary | ICD-10-CM | POA: Diagnosis not present

## 2020-09-01 DIAGNOSIS — L599 Disorder of the skin and subcutaneous tissue related to radiation, unspecified: Secondary | ICD-10-CM | POA: Diagnosis not present

## 2020-09-01 NOTE — Therapy (Signed)
[facility] [address] [place], [place], [address] Phone: [phone]   Fax:  [phone]  Physical Therapy Treatment  Patient Details  Name: Cheyenne Garner MRN: [id number] Date of Birth: [birth year] Referring Provider (PT): Laurence Aly   Encounter Date: 09/01/2020   PT End of Session - 09/01/20 0852     Visit Number 5    Number of Visits 12    Date for PT Re-Evaluation 09/27/20    PT Start Time 0805    PT Stop Time 0853    PT Time Calculation (min) 48 min    Activity Tolerance Patient tolerated treatment well    Behavior During Therapy [facility] for tasks assessed/performed             Past Medical History:  Diagnosis Date   Allergies    Anxiety    Asthma    cough variant asthma developed in last couple of years   Bipolar 1 disorder ([facility])    Cervical cancer screening 09/12/2016   Colon polyp 09/12/2016   Family history of bladder cancer    Family history of breast cancer    Family history of ovarian cancer    Family history of prostate cancer    Hx of colonic polyp 09/12/2016   Colonoscopy 2017 with 1 small polyp, done by Dr Dulce Sellar, repeat colonoscopy in 5 years, 2022   Hypothyroidism    Preventative health care 07/23/2015   Thyroid disease     Past Surgical History:  Procedure Laterality Date   APPENDECTOMY     BREAST LUMPECTOMY WITH RADIOACTIVE SEED LOCALIZATION Bilateral 02/12/2020   Procedure: BILATERAL BREAST LUMPECTOMY WITH RADIOACTIVE SEED LOCALIZATION;  Surgeon: Griselda Miner, MD;  Location: [facility];  Service: General;  Laterality: Bilateral;   RE-EXCISION OF BREAST LUMPECTOMY Left 03/04/2020   Procedure: LEFT BREAST MARGIN REEXCISION;  Surgeon: Griselda Miner, MD;  Location: [facility];  Service: General;  Laterality: Left;   SENTINEL NODE BIOPSY Left 03/04/2020   Procedure: SENTINEL NODE BIOPSY;  Surgeon: Griselda Miner, MD;  Location: [facility];   Service: General;  Laterality: Left;   SKIN BIOPSY     back moles removed (precancer?)    There were no vitals filed for this visit.   Subjective Assessment - 09/01/20 0805     Subjective I was sore after PT. Yesterday at work I had zingers. My chest x-ray was improving.    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 No/denies    Pain Score 0-No pain                               [facility] Adult PT Treatment/Exercise - 09/01/20 0001       Manual Therapy   Myofascial Release to cording in L axilla which is palpable from axilla to upper arm                         PT Long Term Goals - 08/30/20 0819       PT LONG TERM GOAL #1  Title Pt will be independent in self MLD for long term management of lymphedema.    Baseline 08/30/20- pt is independent    Time 4    Period Weeks    Status Achieved      PT LONG TERM GOAL #2   Title Pt will obtain appropriate compression bra for long term management of left breast lymphedema.    Baseline 08/30/20- pt has received a compression bra    Time 4    Period Weeks    Status Achieved      PT LONG TERM GOAL #3   Title Pt will report a 50% decrease in swelling in L breast to decrease risk of infection.    Baseline 08/30/20- 75% decrease    Time 4    Period Weeks    Status Achieved      PT LONG TERM GOAL #4   Title Pt will report no tightness across L chest with end range L shoulder ROM to allow improved comfort.    Baseline 08/30/20- pt is continuing to have this    Time 4    Period Weeks    Status On-going                   Plan - 09/01/20 9326     Clinical Impression Statement Continued with myofascial release  to L axilla. At least 2 cords palpable today and pt could feel a really good stretch to them with myofascial release. Pt still having tightness at end range due to cording. Will continue to focus on myofascial release to reduce cording.    PT Frequency 2x / week    PT Duration 4 weeks    PT Treatment/Interventions ADLs/Self Care Home Management;Patient/family education;Therapeutic exercise;Manual lymph drainage;Manual techniques;Scar mobilization;Taping;Vasopneumatic Device    PT Next Visit Plan MFR to cording in L axilla, MLD PRN to L breast    PT Home Exercise Plan Self MLD    Consulted and Agree with Plan of Care Patient             Patient will benefit from skilled therapeutic intervention in order to improve the following deficits and impairments:  Postural dysfunction, Increased fascial restricitons, Decreased scar mobility, Increased edema  Visit Diagnosis: Stiffness of left shoulder, not elsewhere classified  Disorder of the skin and subcutaneous tissue related to radiation, unspecified  Abnormal posture  Lymphedema, not elsewhere classified  Aftercare following surgery for neoplasm  Malignant neoplasm of upper-outer quadrant of left breast in female, estrogen receptor positive ([place])     Problem List Patient Active Problem List   Diagnosis Date Noted   Left upper lobe pneumonia 08/15/2020   [place] 07/14/2020   Malignant neoplasm of overlapping sites of left breast in female, estrogen receptor positive ([place]) 05/18/2020   Genetic testing 04/07/2020   Hypothyroidism    Anxiety    Allergies    FH: thoracic aortic aneurysm 03/23/2020   Family history of breast cancer    Family history of ovarian cancer    Family history of bladder cancer    Family history of prostate cancer    Breast cancer in female ([place]) 03/09/2020   Bilateral dry eyes 01/27/2019   Cervical cancer screening 09/12/2016   Hx of colonic polyp 09/12/2016   Colon polyp 09/12/2016   Preventative  health care 07/23/2015   Asthma    Thyroid disease    Bipolar affective ([place])    Urinary incontinence 12/25/2011    Allyson Sabal [place] 09/01/2020, 9:00 AM  [place], [place], [address] Phone: [phone]   Fax:  [phone]  Name: Cheyenne Garner MRN: [id number] Date of Birth: 06-24-1965   Manus Gunning, PT 09/01/20 9:00 AM

## 2020-09-06 ENCOUNTER — Ambulatory Visit: Payer: BC Managed Care – PPO

## 2020-09-06 ENCOUNTER — Other Ambulatory Visit: Payer: Self-pay

## 2020-09-06 DIAGNOSIS — C50412 Malignant neoplasm of upper-outer quadrant of left female breast: Secondary | ICD-10-CM | POA: Diagnosis not present

## 2020-09-06 DIAGNOSIS — I890 Lymphedema, not elsewhere classified: Secondary | ICD-10-CM | POA: Diagnosis not present

## 2020-09-06 DIAGNOSIS — L599 Disorder of the skin and subcutaneous tissue related to radiation, unspecified: Secondary | ICD-10-CM | POA: Diagnosis not present

## 2020-09-06 DIAGNOSIS — R293 Abnormal posture: Secondary | ICD-10-CM

## 2020-09-06 DIAGNOSIS — M25612 Stiffness of left shoulder, not elsewhere classified: Secondary | ICD-10-CM | POA: Diagnosis not present

## 2020-09-06 DIAGNOSIS — Z170 Estrogen receptor positive status [ER+]: Secondary | ICD-10-CM

## 2020-09-06 DIAGNOSIS — Z483 Aftercare following surgery for neoplasm: Secondary | ICD-10-CM | POA: Diagnosis not present

## 2020-09-06 NOTE — Therapy (Signed)
[place], [place], [address] Phone: [phone]   Fax:  [phone]  Physical Therapy Treatment  Patient Details  Name: Cheyenne Garner MRN: [id number] Date of Birth: 1965-08-06 Referring Provider (PT): Shona Simpson   Encounter Date: 09/06/2020   PT End of Session - 09/06/20 1214     Visit Number 6    Number of Visits 12    Date for PT Re-Evaluation 09/27/20    PT Start Time 1103    PT Stop Time 1202    PT Time Calculation (min) 59 min    Activity Tolerance Patient tolerated treatment well    Behavior During Therapy [facility] for tasks assessed/performed             Past Medical History:  Diagnosis Date   Allergies    Anxiety    Asthma    cough variant asthma developed in last couple of years   Bipolar 1 disorder ([place])    Cervical cancer screening 09/12/2016   Colon polyp 09/12/2016   Family history of bladder cancer    Family history of breast cancer    Family history of ovarian cancer    Family history of prostate cancer    Hx of colonic polyp 09/12/2016   Colonoscopy 2017 with 1 small polyp, done by Dr [name], repeat colonoscopy in 5 years, 2022   Hypothyroidism    Preventative health care 07/23/2015   Thyroid disease     Past Surgical History:  Procedure Laterality Date   APPENDECTOMY     BREAST LUMPECTOMY WITH RADIOACTIVE SEED LOCALIZATION Bilateral 02/12/2020   Procedure: BILATERAL BREAST LUMPECTOMY WITH RADIOACTIVE SEED LOCALIZATION;  Surgeon: Jovita Kussmaul, MD;  Location: [place];  Service: General;  Laterality: Bilateral;   RE-EXCISION OF BREAST LUMPECTOMY Left 03/04/2020   Procedure: LEFT BREAST MARGIN REEXCISION;  Surgeon: Jovita Kussmaul, MD;  Location: [place];  Service: General;  Laterality: Left;   SENTINEL NODE BIOPSY Left 03/04/2020   Procedure: SENTINEL NODE BIOPSY;  Surgeon: Jovita Kussmaul, MD;  Location: [place];   Service: General;  Laterality: Left;   SKIN BIOPSY     back moles removed (precancer?)    There were no vitals filed for this visit.   Subjective Assessment - 09/06/20 1108     Subjective I've been doing good about wearing my compression bras every day but I need to be doing my self MLD more frequently.    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 No/denies                               [facility] Adult PT Treatment/Exercise - 09/06/20 0001       Manual Therapy   Myofascial Release to cording in L axilla which is palpable from axilla to upper arm    Manual Lymphatic Drainage (MLD) In Supine: Short neck, 5 diaphragmatic breaths, Rt axillary and Lt inguinal nodes, anterior inter-axillary and Lt axillo-inguinal anastomosis, then focued on superior aspect of Lt breast redirecting towards anterior anastomosis, then into Rt S/L for further work  to lateral breast redirecting towards axillo-inguinal anastomosis, then finished retracing all steps in supine                         PT Long Term Goals - 08/30/20 0819       PT LONG TERM GOAL #1   Title Pt will be independent in self MLD for long term management of lymphedema.    Baseline 08/30/20- pt is independent    Time 4    Period Weeks    Status Achieved      PT LONG TERM GOAL #2   Title Pt will obtain appropriate compression bra for long term management of left breast lymphedema.    Baseline 08/30/20- pt has received a compression bra    Time 4    Period Weeks    Status Achieved      PT LONG TERM GOAL #3   Title Pt will report a 50% decrease in swelling in L breast to decrease risk of infection.    Baseline 08/30/20- 75%  decrease    Time 4    Period Weeks    Status Achieved      PT LONG TERM GOAL #4   Title Pt will report no tightness across L chest with end range L shoulder ROM to allow improved comfort.    Baseline 08/30/20- pt is continuing to have this    Time 4    Period Weeks    Status On-going                   Plan - 09/06/20 1215     Clinical Impression Statement Continued with focus on MFR to cording at Lt axilla, though this was less palpable today and pt reports much less painful during stretching than at last session. Also incorporated MLD to Lt breast during treatment as pt reports she hasn't been very consistent with this so reviewed importance of complaince with this as this can also help improve the cording symptoms. Pt verbalized good understanding.    Stability/Clinical Decision Making Stable/Uncomplicated    Rehab Potential Good    PT Frequency 2x / week    PT Duration 4 weeks    PT Treatment/Interventions ADLs/Self Care Home Management;Patient/family education;Therapeutic exercise;Manual lymph drainage;Manual techniques;Scar mobilization;Taping;Vasopneumatic Device    PT Next Visit Plan MFR to cording in L axilla, MLD PRN to L breast    PT Home Exercise Plan Self MLD    Consulted and Agree with Plan of Care Patient             Patient will benefit from skilled therapeutic intervention in order to improve the following deficits and impairments:  Postural dysfunction, Increased fascial restricitons, Decreased scar mobility, Increased edema  Visit Diagnosis: Stiffness of left shoulder, not elsewhere classified  Disorder of the skin and subcutaneous tissue related to radiation, unspecified  Abnormal posture  Lymphedema, not elsewhere classified  Aftercare following surgery for neoplasm  Malignant neoplasm of upper-outer quadrant of left breast in female, estrogen receptor positive ([place])     Problem List Patient Active Problem List   Diagnosis Date Noted    Left upper lobe pneumonia 08/15/2020   [place] 07/14/2020   Malignant neoplasm of overlapping sites of left breast in female, estrogen receptor positive ([place]) 05/18/2020   Genetic testing 04/07/2020   Hypothyroidism    Anxiety    Allergies    FH: thoracic aortic aneurysm 03/23/2020   Family history of breast cancer  Family history of ovarian cancer    Family history of bladder cancer    Family history of prostate cancer    Breast cancer in female [facility]) 03/09/2020   Bilateral dry eyes 01/27/2019   Cervical cancer screening 09/12/2016   Hx of colonic polyp 09/12/2016   Colon polyp 09/12/2016   Preventative health care 07/23/2015   Asthma    Thyroid disease    Bipolar affective ([place])    Urinary incontinence 12/25/2011    Otelia Limes, PTA 09/06/2020, 12:17 PM  [place], [place], [address] Phone: [phone]   Fax:  [phone]  Name: LATASHA BUCZKOWSKI MRN: [id number] Date of Birth: February 28, 1965

## 2020-09-08 ENCOUNTER — Ambulatory Visit: Payer: BC Managed Care – PPO | Admitting: Physical Therapy

## 2020-09-08 ENCOUNTER — Encounter: Payer: Self-pay | Admitting: Physical Therapy

## 2020-09-08 ENCOUNTER — Other Ambulatory Visit: Payer: Self-pay

## 2020-09-08 DIAGNOSIS — Z483 Aftercare following surgery for neoplasm: Secondary | ICD-10-CM

## 2020-09-08 DIAGNOSIS — Z170 Estrogen receptor positive status [ER+]: Secondary | ICD-10-CM | POA: Diagnosis not present

## 2020-09-08 DIAGNOSIS — C50412 Malignant neoplasm of upper-outer quadrant of left female breast: Secondary | ICD-10-CM

## 2020-09-08 DIAGNOSIS — R293 Abnormal posture: Secondary | ICD-10-CM

## 2020-09-08 DIAGNOSIS — I890 Lymphedema, not elsewhere classified: Secondary | ICD-10-CM

## 2020-09-08 DIAGNOSIS — M25612 Stiffness of left shoulder, not elsewhere classified: Secondary | ICD-10-CM | POA: Diagnosis not present

## 2020-09-08 DIAGNOSIS — L599 Disorder of the skin and subcutaneous tissue related to radiation, unspecified: Secondary | ICD-10-CM

## 2020-09-08 NOTE — Therapy (Signed)
[place], [place], [address] Phone: [phone]   Fax:  [phone]  Physical Therapy Treatment  Patient Details  Name: Cheyenne Garner MRN: [id number] Date of Birth: Jan 10, 1966 Referring Provider (PT): Shona Simpson   Encounter Date: 09/08/2020   PT End of Session - 09/08/20 0855     Visit Number 7    Number of Visits 12    Date for PT Re-Evaluation 09/27/20    PT Start Time 0811   pt arrived late   PT Stop Time 0858    PT Time Calculation (min) 47 min    Activity Tolerance Patient tolerated treatment well    Behavior During Therapy [facility] for tasks assessed/performed             Past Medical History:  Diagnosis Date   Allergies    Anxiety    Asthma    cough variant asthma developed in last couple of years   Bipolar 1 disorder ([place])    Cervical cancer screening 09/12/2016   Colon polyp 09/12/2016   Family history of bladder cancer    Family history of breast cancer    Family history of ovarian cancer    Family history of prostate cancer    Hx of colonic polyp 09/12/2016   Colonoscopy 2017 with 1 small polyp, done by Dr [name], repeat colonoscopy in 5 years, 2022   Hypothyroidism    Preventative health care 07/23/2015   Thyroid disease     Past Surgical History:  Procedure Laterality Date   APPENDECTOMY     BREAST LUMPECTOMY WITH RADIOACTIVE SEED LOCALIZATION Bilateral 02/12/2020   Procedure: BILATERAL BREAST LUMPECTOMY WITH RADIOACTIVE SEED LOCALIZATION;  Surgeon: Jovita Kussmaul, MD;  Location: [place];  Service: General;  Laterality: Bilateral;   RE-EXCISION OF BREAST LUMPECTOMY Left 03/04/2020   Procedure: LEFT BREAST MARGIN REEXCISION;  Surgeon: Jovita Kussmaul, MD;  Location: [place];  Service: General;  Laterality: Left;   SENTINEL NODE BIOPSY Left 03/04/2020   Procedure: SENTINEL NODE BIOPSY;  Surgeon: Jovita Kussmaul, MD;  Location: [place];  Service: General;  Laterality: Left;   SKIN BIOPSY     back moles removed (precancer?)    There were no vitals filed for this visit.   Subjective Assessment - 09/08/20 0813     Subjective My swelling got worse since last session. I have been cleaning out closets and the garage.    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 No/denies   pt reports tenderness and soreness in L breast but not pain   Pain Score 0-No pain                               [facility] Adult PT Treatment/Exercise - 09/08/20 0001       Manual Therapy   Myofascial Release to area where cording was present in L axilla though no cording present today    Manual Lymphatic Drainage (MLD) In Supine: Short neck, 5 diaphragmatic breaths, Rt axillary and Lt inguinal nodes, anterior inter-axillary and Lt axillo-inguinal anastomosis, then  focued on superior aspect of Lt breast redirecting towards anterior anastomosis, then into Rt S/L for further work to lateral breast redirecting towards axillo-inguinal anastomosis, then finished retracing all steps in supine                         PT Long Term Goals - 08/30/20 0819       PT LONG TERM GOAL #1   Title Pt will be independent in self MLD for long term management of lymphedema.    Baseline 08/30/20- pt is independent    Time 4    Period Weeks    Status Achieved      PT LONG TERM GOAL #2   Title Pt will obtain appropriate compression bra for long term management of left breast lymphedema.    Baseline 08/30/20- pt has received a compression bra    Time 4    Period Weeks    Status Achieved      PT LONG TERM GOAL #3   Title Pt will report a 50% decrease  in swelling in L breast to decrease risk of infection.    Baseline 08/30/20- 75% decrease    Time 4    Period Weeks    Status Achieved      PT LONG TERM GOAL #4   Title Pt will report no tightness across L chest with end range L shoulder ROM to allow improved comfort.    Baseline 08/30/20- pt is continuing to have this    Time 4    Period Weeks    Status On-going                   Plan - 09/08/20 0855     Clinical Impression Statement Pt's cording has diminished and was not palpable today. Pt did not feel as much discomfort today with myofascial release. Continued with MLD to L breast and no fibrosis was palpable today. Pt reports she does need to be more consistent with her exercises and plans on doing her self MLD more often. Pt is approaching discharge as long as her cording and swelling remain controlled. Will reassess this next week.    PT Frequency 2x / week    PT Duration 4 weeks    PT Treatment/Interventions ADLs/Self Care Home Management;Patient/family education;Therapeutic exercise;Manual lymph drainage;Manual techniques;Scar mobilization;Taping;Vasopneumatic Device    PT Next Visit Plan MFR to cording in L axilla, MLD PRN to L breast    PT Home Exercise Plan Self MLD    Consulted and Agree with Plan of Care Patient             Patient will benefit from skilled therapeutic intervention in order to improve the following deficits and impairments:  Postural dysfunction, Increased fascial restricitons, Decreased scar mobility, Increased edema  Visit Diagnosis: Stiffness of left shoulder, not elsewhere classified  Disorder of the skin and subcutaneous tissue related to radiation, unspecified  Lymphedema, not elsewhere classified  Abnormal posture  Aftercare following surgery for neoplasm  Malignant neoplasm of upper-outer quadrant of left breast in female, estrogen receptor positive ([place])     Problem List Patient Active Problem List   Diagnosis Date  Noted   Left upper lobe pneumonia 08/15/2020   Milia 07/14/2020   Malignant neoplasm of overlapping sites of left breast in female, estrogen receptor positive ([place]) 05/18/2020   Genetic testing 04/07/2020   Hypothyroidism    Anxiety    Allergies    FH: thoracic aortic  aneurysm 03/23/2020   Family history of breast cancer    Family history of ovarian cancer    Family history of bladder cancer    Family history of prostate cancer    Breast cancer in female [facility] 03/09/2020   Bilateral dry eyes 01/27/2019   Cervical cancer screening 09/12/2016   Hx of colonic polyp 09/12/2016   Colon polyp 09/12/2016   Preventative health care 07/23/2015   Asthma    Thyroid disease    Bipolar affective ([place])    Urinary incontinence 12/25/2011    Allyson Sabal [facility] 09/08/2020, 8:58 AM  [place], [place], [address] Phone: [phone]   Fax:  [phone]  Name: Cheyenne Garner MRN: [id number] Date of Birth: March 23, 1965   Manus Gunning, PT 09/08/20 8:59 AM

## 2020-09-13 DIAGNOSIS — Z86010 Personal history of colonic polyps: Secondary | ICD-10-CM | POA: Diagnosis not present

## 2020-09-13 DIAGNOSIS — Z800 Family history of malignant neoplasm of digestive organs: Secondary | ICD-10-CM | POA: Diagnosis not present

## 2020-09-14 ENCOUNTER — Other Ambulatory Visit: Payer: Self-pay

## 2020-09-14 ENCOUNTER — Ambulatory Visit: Payer: BC Managed Care – PPO | Admitting: Physical Therapy

## 2020-09-14 ENCOUNTER — Encounter: Payer: Self-pay | Admitting: Physical Therapy

## 2020-09-14 DIAGNOSIS — Z170 Estrogen receptor positive status [ER+]: Secondary | ICD-10-CM | POA: Diagnosis not present

## 2020-09-14 DIAGNOSIS — M25612 Stiffness of left shoulder, not elsewhere classified: Secondary | ICD-10-CM | POA: Diagnosis not present

## 2020-09-14 DIAGNOSIS — L599 Disorder of the skin and subcutaneous tissue related to radiation, unspecified: Secondary | ICD-10-CM | POA: Diagnosis not present

## 2020-09-14 DIAGNOSIS — C50412 Malignant neoplasm of upper-outer quadrant of left female breast: Secondary | ICD-10-CM | POA: Diagnosis not present

## 2020-09-14 DIAGNOSIS — I890 Lymphedema, not elsewhere classified: Secondary | ICD-10-CM

## 2020-09-14 DIAGNOSIS — Z483 Aftercare following surgery for neoplasm: Secondary | ICD-10-CM | POA: Diagnosis not present

## 2020-09-14 DIAGNOSIS — R293 Abnormal posture: Secondary | ICD-10-CM

## 2020-09-14 NOTE — Therapy (Signed)
[place], [place], [address] Phone: [phone]   Fax:  [phone]  Physical Therapy Treatment  Patient Details  Name: Cheyenne Garner MRN: [id number] Date of Birth: April 16, 1965 Referring Provider (PT): Shona Simpson   Encounter Date: 09/14/2020   PT End of Session - 09/14/20 0954     Visit Number 8    Number of Visits 12    Date for PT Re-Evaluation 09/27/20    PT Start Time 0908    PT Stop Time 0955    PT Time Calculation (min) 47 min    Activity Tolerance Patient tolerated treatment well    Behavior During Therapy [facility] for tasks assessed/performed             Past Medical History:  Diagnosis Date   Allergies    Anxiety    Asthma    cough variant asthma developed in last couple of years   Bipolar 1 disorder ([place])    Cervical cancer screening 09/12/2016   Colon polyp 09/12/2016   Family history of bladder cancer    Family history of breast cancer    Family history of ovarian cancer    Family history of prostate cancer    Hx of colonic polyp 09/12/2016   Colonoscopy 2017 with 1 small polyp, done by Dr [name], repeat colonoscopy in 5 years, 2022   Hypothyroidism    Preventative health care 07/23/2015   Thyroid disease     Past Surgical History:  Procedure Laterality Date   APPENDECTOMY     BREAST LUMPECTOMY WITH RADIOACTIVE SEED LOCALIZATION Bilateral 02/12/2020   Procedure: BILATERAL BREAST LUMPECTOMY WITH RADIOACTIVE SEED LOCALIZATION;  Surgeon: Jovita Kussmaul, MD;  Location: [place];  Service: General;  Laterality: Bilateral;   RE-EXCISION OF BREAST LUMPECTOMY Left 03/04/2020   Procedure: LEFT BREAST MARGIN REEXCISION;  Surgeon: Jovita Kussmaul, MD;  Location: [place];  Service: General;  Laterality: Left;   SENTINEL NODE BIOPSY Left 03/04/2020   Procedure: SENTINEL NODE BIOPSY;  Surgeon: Jovita Kussmaul, MD;  Location: [place];   Service: General;  Laterality: Left;   SKIN BIOPSY     back moles removed (precancer?)    There were no vitals filed for this visit.   Subjective Assessment - 09/14/20 0917     Subjective I left here after the last treatment and my armpit was swollen. I did my massage three times a day on the days I was off and the swelling went away.    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 No/denies    Pain Score 0-No pain                               [facility] Adult PT Treatment/Exercise - 09/14/20 0001       Manual Therapy   Myofascial Release to cording in L axilla with 1 cord palpable and pt reports this is the area where she has increased tightness    Manual Lymphatic Drainage (MLD) In Supine: Short neck, 5 diaphragmatic breaths, Lt inguinal nodes,  Lt axillo-inguinal anastomosis,  then focued on lateral aspect of Lt breast redirecting towards anastomosis, then finished retracing all steps in supine                    PT Education - 09/14/20 1003     Education Details how to stretch cording on wall and use other hand to stretch cording in opposite direction    Person(s) Educated Patient    Methods Explanation    Comprehension Verbalized understanding                 PT Long Term Goals - 08/30/20 0819       PT LONG TERM GOAL #1   Title Pt will be independent in self MLD for long term management of lymphedema.    Baseline 08/30/20- pt is independent    Time 4    Period Weeks    Status Achieved      PT LONG TERM GOAL #2   Title Pt will obtain appropriate compression bra for long term management of left breast lymphedema.    Baseline 08/30/20- pt has received a  compression bra    Time 4    Period Weeks    Status Achieved      PT LONG TERM GOAL #3   Title Pt will report a 50% decrease in swelling in L breast to decrease risk of infection.    Baseline 08/30/20- 75% decrease    Time 4    Period Weeks    Status Achieved      PT LONG TERM GOAL #4   Title Pt will report no tightness across L chest with end range L shoulder ROM to allow improved comfort.    Baseline 08/30/20- pt is continuing to have this    Time 4    Period Weeks    Status On-going                   Plan - 09/14/20 0956     Clinical Impression Statement Another cord was palpable in L axilla that is causing increased tightness. Educated pt how to stretch cording with arm on wall while using free hand to stretch cord in opposite direction. Then focused on MLD to lateral breast and axilla to help reduce swelling.    PT Frequency 2x / week    PT Duration 4 weeks    PT Treatment/Interventions ADLs/Self Care Home Management;Patient/family education;Therapeutic exercise;Manual lymph drainage;Manual techniques;Scar mobilization;Taping;Vasopneumatic Device    PT Next Visit Plan MFR to cording in L axilla, MLD PRN to L breast    PT Home Exercise Plan Self MLD    Consulted and Agree with Plan of Care Patient             Patient will benefit from skilled therapeutic intervention in order to improve the following deficits and impairments:  Postural dysfunction, Increased fascial restricitons, Decreased scar mobility, Increased edema  Visit Diagnosis: Stiffness of left shoulder, not elsewhere classified  Disorder of the skin and subcutaneous tissue related to radiation, unspecified  Lymphedema, not elsewhere classified  Abnormal posture  Aftercare following surgery for neoplasm  Malignant neoplasm of upper-outer quadrant of left breast in female, estrogen receptor positive ([facility])     Problem List Patient Active Problem List   Diagnosis Date Noted   Left upper  lobe pneumonia 08/15/2020   Milia 07/14/2020   Malignant neoplasm of overlapping sites of left breast in female, estrogen receptor positive ([facility]) 05/18/2020   Genetic testing 04/07/2020  Hypothyroidism    Anxiety    Allergies    FH: thoracic aortic aneurysm 03/23/2020   Family history of breast cancer    Family history of ovarian cancer    Family history of bladder cancer    Family history of prostate cancer    Breast cancer in female [facility]) 03/09/2020   Bilateral dry eyes 01/27/2019   Cervical cancer screening 09/12/2016   Hx of colonic polyp 09/12/2016   Colon polyp 09/12/2016   Preventative health care 07/23/2015   Asthma    Thyroid disease    Bipolar affective ([place])    Urinary incontinence 12/25/2011    Allyson Sabal [facility] 09/14/2020, 10:04 AM  [place], [place], [address] Phone: [phone]   Fax:  [phone]  Name: TALIN ROZEBOOM MRN: [id number] Date of Birth: 1965-04-02  Manus Gunning, PT 09/14/20 10:04 AM

## 2020-09-15 ENCOUNTER — Ambulatory Visit: Payer: BC Managed Care – PPO | Admitting: Physical Therapy

## 2020-09-15 ENCOUNTER — Encounter: Payer: Self-pay | Admitting: Physical Therapy

## 2020-09-15 DIAGNOSIS — R293 Abnormal posture: Secondary | ICD-10-CM

## 2020-09-15 DIAGNOSIS — Z170 Estrogen receptor positive status [ER+]: Secondary | ICD-10-CM

## 2020-09-15 DIAGNOSIS — I890 Lymphedema, not elsewhere classified: Secondary | ICD-10-CM | POA: Diagnosis not present

## 2020-09-15 DIAGNOSIS — Z483 Aftercare following surgery for neoplasm: Secondary | ICD-10-CM | POA: Diagnosis not present

## 2020-09-15 DIAGNOSIS — M25612 Stiffness of left shoulder, not elsewhere classified: Secondary | ICD-10-CM

## 2020-09-15 DIAGNOSIS — C50412 Malignant neoplasm of upper-outer quadrant of left female breast: Secondary | ICD-10-CM | POA: Diagnosis not present

## 2020-09-15 DIAGNOSIS — L599 Disorder of the skin and subcutaneous tissue related to radiation, unspecified: Secondary | ICD-10-CM

## 2020-09-15 NOTE — Therapy (Signed)
[place], [place], [address] Phone: [phone]   Fax:  [phone]  Physical Therapy Treatment  Patient Details  Name: Cheyenne Garner MRN: [id number] Date of Birth: 02/03/1965 Referring Provider (PT): Shona Simpson   Encounter Date: 09/15/2020   PT End of Session - 09/15/20 1000     Visit Number 9    Number of Visits 12    Date for PT Re-Evaluation 09/27/20    PT Start Time 0906    PT Stop Time 1000    PT Time Calculation (min) 54 min    Activity Tolerance Patient tolerated treatment well    Behavior During Therapy [facility] for tasks assessed/performed             Past Medical History:  Diagnosis Date   Allergies    Anxiety    Asthma    cough variant asthma developed in last couple of years   Bipolar 1 disorder ([place])    Cervical cancer screening 09/12/2016   Colon polyp 09/12/2016   Family history of bladder cancer    Family history of breast cancer    Family history of ovarian cancer    Family history of prostate cancer    Hx of colonic polyp 09/12/2016   Colonoscopy 2017 with 1 small polyp, done by Dr [name], repeat colonoscopy in 5 years, 2022   Hypothyroidism    Preventative health care 07/23/2015   Thyroid disease     Past Surgical History:  Procedure Laterality Date   APPENDECTOMY     BREAST LUMPECTOMY WITH RADIOACTIVE SEED LOCALIZATION Bilateral 02/12/2020   Procedure: BILATERAL BREAST LUMPECTOMY WITH RADIOACTIVE SEED LOCALIZATION;  Surgeon: Jovita Kussmaul, MD;  Location: [place];  Service: General;  Laterality: Bilateral;   RE-EXCISION OF BREAST LUMPECTOMY Left 03/04/2020   Procedure: LEFT BREAST MARGIN REEXCISION;  Surgeon: Jovita Kussmaul, MD;  Location: [place];  Service: General;  Laterality: Left;   SENTINEL NODE BIOPSY Left 03/04/2020   Procedure: SENTINEL NODE BIOPSY;  Surgeon: Jovita Kussmaul, MD;  Location: [place];   Service: General;  Laterality: Left;   SKIN BIOPSY     back moles removed (precancer?)    There were no vitals filed for this visit.   Subjective Assessment - 09/15/20 0908     Subjective No swelling in the armpit when I got home. Still having some breast swelling but it is not getting worse. I had a tiny bit of puffiness in my armpit this morning.The tightness is feeling a teeny bit better.    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 No/denies    Pain Score 0-No pain                               [facility] Adult PT Treatment/Exercise - 09/15/20 0001       Manual Therapy   Myofascial Release to area where cording was present yesterday but no cording present today    Manual Lymphatic Drainage (MLD) In Supine: Short neck, 5 diaphragmatic breaths, Lt inguinal nodes,  Lt axillo-inguinal anastomosis, then  focued on lateral aspect of Lt breast redirecting towards anastomosis, then finished retracing all steps in supine                         PT Long Term Goals - 08/30/20 0819       PT LONG TERM GOAL #1   Title Pt will be independent in self MLD for long term management of lymphedema.    Baseline 08/30/20- pt is independent    Time 4    Period Weeks    Status Achieved      PT LONG TERM GOAL #2   Title Pt will obtain appropriate compression bra for long term management of left breast lymphedema.    Baseline 08/30/20- pt has received a compression bra    Time 4    Period Weeks    Status Achieved      PT LONG TERM GOAL #3   Title Pt will report a 50% decrease in swelling in L breast to decrease risk of infection.    Baseline 08/30/20- 75% decrease    Time 4    Period  Weeks    Status Achieved      PT LONG TERM GOAL #4   Title Pt will report no tightness across L chest with end range L shoulder ROM to allow improved comfort.    Baseline 08/30/20- pt is continuing to have this    Time 4    Period Weeks    Status On-going                   Plan - 09/15/20 1001     Clinical Impression Statement No cords were palpable today and pt felt less tight today. She still has some swelling at lateral breast so continued with MLD to this area. Will assess next session to see if cording has returned and if it has not will decrease visits to 1x/wk.    PT Frequency 2x / week    PT Duration 4 weeks    PT Treatment/Interventions ADLs/Self Care Home Management;Patient/family education;Therapeutic exercise;Manual lymph drainage;Manual techniques;Scar mobilization;Taping;Vasopneumatic Device    PT Next Visit Plan MFR to cording in L axilla, MLD PRN to L breast    PT Home Exercise Plan Self MLD    Consulted and Agree with Plan of Care Patient             Patient will benefit from skilled therapeutic intervention in order to improve the following deficits and impairments:  Postural dysfunction, Increased fascial restricitons, Decreased scar mobility, Increased edema  Visit Diagnosis: Stiffness of left shoulder, not elsewhere classified  Disorder of the skin and subcutaneous tissue related to radiation, unspecified  Lymphedema, not elsewhere classified  Abnormal posture  Aftercare following surgery for neoplasm  Malignant neoplasm of upper-outer quadrant of left breast in female, estrogen receptor positive ([place])     Problem List Patient Active Problem List   Diagnosis Date Noted   Left upper lobe pneumonia 08/15/2020   Milia 07/14/2020   Malignant neoplasm of overlapping sites of left breast in female, estrogen receptor positive ([place]) 05/18/2020   Genetic testing 04/07/2020   Hypothyroidism    Anxiety    Allergies    FH: thoracic aortic  aneurysm 03/23/2020   Family history of breast cancer    Family history of ovarian cancer    Family history of bladder cancer    Family history of prostate cancer    Breast cancer in female (  [place]) 03/09/2020   Bilateral dry eyes 01/27/2019   Cervical cancer screening 09/12/2016   Hx of colonic polyp 09/12/2016   Colon polyp 09/12/2016   Preventative health care 07/23/2015   Asthma    Thyroid disease    Bipolar affective ([place])    Urinary incontinence 12/25/2011    Allyson Sabal [facility] 09/15/2020, 10:05 AM  [place], [place], [address] Phone: [phone]   Fax:  [phone]  Name: CATERINE MCMEANS MRN: [id number]  Manus Gunning, PT 09/15/20 10:06 AM  Date of Birth: 1966-01-02

## 2020-09-23 ENCOUNTER — Ambulatory Visit: Payer: BC Managed Care – PPO | Admitting: Family Medicine

## 2020-09-27 ENCOUNTER — Ambulatory Visit: Payer: BC Managed Care – PPO | Admitting: Physical Therapy

## 2020-09-28 ENCOUNTER — Ambulatory Visit: Payer: BC Managed Care – PPO | Attending: General Surgery | Admitting: Physical Therapy

## 2020-09-28 ENCOUNTER — Other Ambulatory Visit: Payer: Self-pay

## 2020-09-28 ENCOUNTER — Encounter: Payer: Self-pay | Admitting: Physical Therapy

## 2020-09-28 DIAGNOSIS — Z170 Estrogen receptor positive status [ER+]: Secondary | ICD-10-CM | POA: Diagnosis not present

## 2020-09-28 DIAGNOSIS — R293 Abnormal posture: Secondary | ICD-10-CM | POA: Insufficient documentation

## 2020-09-28 DIAGNOSIS — Z483 Aftercare following surgery for neoplasm: Secondary | ICD-10-CM | POA: Insufficient documentation

## 2020-09-28 DIAGNOSIS — M25612 Stiffness of left shoulder, not elsewhere classified: Secondary | ICD-10-CM | POA: Diagnosis not present

## 2020-09-28 DIAGNOSIS — I890 Lymphedema, not elsewhere classified: Secondary | ICD-10-CM

## 2020-09-28 DIAGNOSIS — L599 Disorder of the skin and subcutaneous tissue related to radiation, unspecified: Secondary | ICD-10-CM | POA: Diagnosis not present

## 2020-09-28 DIAGNOSIS — C50412 Malignant neoplasm of upper-outer quadrant of left female breast: Secondary | ICD-10-CM | POA: Diagnosis not present

## 2020-09-28 NOTE — Therapy (Signed)
[place], [place], [address] Phone: [phone]   Fax:  [phone]  Physical Therapy Treatment  Patient Details  Name: Cheyenne Garner MRN: [id number] Date of Birth: August 12, 1965 Referring Provider (PT): Shona Simpson   Encounter Date: 09/28/2020   PT End of Session - 09/28/20 1553     Visit Number 10    Number of Visits 14    Date for PT Re-Evaluation 10/26/20    PT Start Time 6962    PT Stop Time 1655    PT Time Calculation (min) 50 min    Activity Tolerance Patient tolerated treatment well    Behavior During Therapy [facility] for tasks assessed/performed             Past Medical History:  Diagnosis Date   Allergies    Anxiety    Asthma    cough variant asthma developed in last couple of years   Bipolar 1 disorder ([place])    Cervical cancer screening 09/12/2016   Colon polyp 09/12/2016   Family history of bladder cancer    Family history of breast cancer    Family history of ovarian cancer    Family history of prostate cancer    Hx of colonic polyp 09/12/2016   Colonoscopy 2017 with 1 small polyp, done by Dr [name], repeat colonoscopy in 5 years, 2022   Hypothyroidism    Preventative health care 07/23/2015   Thyroid disease     Past Surgical History:  Procedure Laterality Date   APPENDECTOMY     BREAST LUMPECTOMY WITH RADIOACTIVE SEED LOCALIZATION Bilateral 02/12/2020   Procedure: BILATERAL BREAST LUMPECTOMY WITH RADIOACTIVE SEED LOCALIZATION;  Surgeon: Jovita Kussmaul, MD;  Location: [place];  Service: General;  Laterality: Bilateral;   RE-EXCISION OF BREAST LUMPECTOMY Left 03/04/2020   Procedure: LEFT BREAST MARGIN REEXCISION;  Surgeon: Jovita Kussmaul, MD;  Location: [place];  Service: General;  Laterality: Left;   SENTINEL NODE BIOPSY Left 03/04/2020   Procedure: SENTINEL NODE BIOPSY;  Surgeon: Jovita Kussmaul, MD;  Location: [place];   Service: General;  Laterality: Left;   SKIN BIOPSY     back moles removed (precancer?)    There were no vitals filed for this visit.   Subjective Assessment - 09/28/20 1505     Subjective I am frustrated with the swelling. It is much better than it was but it is still there. Is this as good as it gets?    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 No/denies    Pain Score 0-No pain                               [facility] Adult PT Treatment/Exercise - 09/28/20 0001       Manual Therapy   Soft tissue mobilization in R S/L to L serratus anterior in areas of tightness using cocoa butter    Manual Lymphatic Drainage (MLD) In Supine: Short neck, 5 diaphragmatic breaths, Lt inguinal nodes,  Lt axillo-inguinal anastomosis, then focued on lateral aspect of Lt breast redirecting towards anastomosis, then  finished retracing all steps in supine                          PT Long Term Goals - 09/28/20 1508       PT LONG TERM GOAL #1   Title Pt will be independent in self MLD for long term management of lymphedema.    Baseline 08/30/20- pt is independent    Time 4    Period Weeks    Status Achieved      PT LONG TERM GOAL #2   Title Pt will obtain appropriate compression bra for long term management of left breast lymphedema.    Baseline 08/30/20- pt has received a compression bra    Time 4    Period Weeks    Status Achieved      PT LONG TERM GOAL #3   Title Pt will report a 50% decrease in swelling in L breast to decrease risk of infection.    Baseline 08/30/20- 75% decrease    Time 4    Period Weeks    Status Achieved      PT LONG TERM GOAL #4   Title Pt will report no  tightness across L chest with end range L shoulder ROM to allow improved comfort.    Baseline 08/30/20- pt is continuing to have this; 09/28/20- pt is not only having tightness in L axilla    Time 4    Period Weeks    Status Achieved      PT LONG TERM GOAL #5   Title Pt will report no soreness to palpation or tenderness in L axilla while pt returns to the gym to allow her to return to PLOF.    Time 4    Period Weeks    Status New    Target Date 10/26/20                   Plan - 09/28/20 1554     Clinical Impression Statement Assessed pt's progress towards goals in therapy. Pt has met all current goals for therapy but is still having tightness in L axilla and tenderness to touch. Added appropriate goal to address this and will decrease to 1x/wk for another 4 weeks. Added soft tissue mobilization to L serratus anterior with several areas of tightness palpable. Pt would benefit from additional skilled PT visits to decrease L axillary tightness and tenderness in L chest.    PT Frequency 2x / week    PT Duration 4 weeks    PT Treatment/Interventions ADLs/Self Care Home Management;Patient/family education;Therapeutic exercise;Manual lymph drainage;Manual techniques;Scar mobilization;Taping;Vasopneumatic Device    PT Next Visit Plan MFR to cording in L axilla, MLD PRN to L breast, STM to L serratus    PT Home Exercise Plan Self MLD    Consulted and Agree with Plan of Care Patient             Patient will benefit from skilled therapeutic intervention in order to improve the following deficits and impairments:  Postural dysfunction, Increased fascial restricitons, Decreased scar mobility, Increased edema  Visit Diagnosis: Disorder of the skin and subcutaneous tissue related to radiation, unspecified  Stiffness of left shoulder, not elsewhere classified  Lymphedema, not elsewhere classified  Aftercare following surgery for neoplasm  Abnormal posture  Malignant neoplasm of  upper-outer quadrant of left breast in female, estrogen receptor positive ([place])     Problem List Patient Active Problem List   Diagnosis Date  Noted   Left upper lobe pneumonia 08/15/2020   [place] 07/14/2020   Malignant neoplasm of overlapping sites of left breast in female, estrogen receptor positive ([place]) 05/18/2020   Genetic testing 04/07/2020   Hypothyroidism    Anxiety    Allergies    FH: thoracic aortic aneurysm 03/23/2020   Family history of breast cancer    Family history of ovarian cancer    Family history of bladder cancer    Family history of prostate cancer    Breast cancer in female ([place]) 03/09/2020   Bilateral dry eyes 01/27/2019   Cervical cancer screening 09/12/2016   Hx of colonic polyp 09/12/2016   Colon polyp 09/12/2016   Preventative health care 07/23/2015   Asthma    Thyroid disease    Bipolar affective [facility])    Urinary incontinence 12/25/2011    Manus Gunning, PT 09/28/2020, 4:02 PM  [place], [place], [address] Phone: [phone]   Fax:  [phone]  Name: Cheyenne Garner MRN: [id number] Date of Birth: 28-May-1965  Manus Gunning, PT 09/28/20 4:02 PM

## 2020-10-06 DIAGNOSIS — Z6822 Body mass index (BMI) 22.0-22.9, adult: Secondary | ICD-10-CM | POA: Diagnosis not present

## 2020-10-06 DIAGNOSIS — Z124 Encounter for screening for malignant neoplasm of cervix: Secondary | ICD-10-CM | POA: Diagnosis not present

## 2020-10-06 DIAGNOSIS — Z01419 Encounter for gynecological examination (general) (routine) without abnormal findings: Secondary | ICD-10-CM | POA: Diagnosis not present

## 2020-10-06 DIAGNOSIS — Z130 Encounter for screening for diseases of the blood and blood-forming organs and certain disorders involving the immune mechanism: Secondary | ICD-10-CM | POA: Diagnosis not present

## 2020-10-06 DIAGNOSIS — Z1389 Encounter for screening for other disorder: Secondary | ICD-10-CM | POA: Diagnosis not present

## 2020-10-06 LAB — HM PAP SMEAR: HM Pap smear: ABNORMAL

## 2020-10-08 ENCOUNTER — Encounter: Payer: Self-pay | Admitting: *Deleted

## 2020-10-13 ENCOUNTER — Ambulatory Visit: Payer: BC Managed Care – PPO | Admitting: Physical Therapy

## 2020-10-13 ENCOUNTER — Other Ambulatory Visit: Payer: Self-pay

## 2020-10-13 DIAGNOSIS — I890 Lymphedema, not elsewhere classified: Secondary | ICD-10-CM | POA: Diagnosis not present

## 2020-10-13 DIAGNOSIS — R293 Abnormal posture: Secondary | ICD-10-CM | POA: Diagnosis not present

## 2020-10-13 DIAGNOSIS — L599 Disorder of the skin and subcutaneous tissue related to radiation, unspecified: Secondary | ICD-10-CM | POA: Diagnosis not present

## 2020-10-13 DIAGNOSIS — Z170 Estrogen receptor positive status [ER+]: Secondary | ICD-10-CM | POA: Diagnosis not present

## 2020-10-13 DIAGNOSIS — M25612 Stiffness of left shoulder, not elsewhere classified: Secondary | ICD-10-CM | POA: Diagnosis not present

## 2020-10-13 DIAGNOSIS — Z483 Aftercare following surgery for neoplasm: Secondary | ICD-10-CM

## 2020-10-13 DIAGNOSIS — C50412 Malignant neoplasm of upper-outer quadrant of left female breast: Secondary | ICD-10-CM | POA: Diagnosis not present

## 2020-10-13 NOTE — Therapy (Signed)
[facility] [address] [place], [place], [address] Phone: [phone]   Fax:  [phone]  Physical Therapy Treatment  Patient Details  Name: Cheyenne Garner MRN: [id number] Date of Birth: 02/06/1965 Referring Provider (PT): Alison Perkins   Encounter Date: 10/13/2020   PT End of Session - 10/13/20 1342     Visit Number 11    Number of Visits 14    Date for PT Re-Evaluation 10/26/20    PT Start Time 1303    PT Stop Time 1345    PT Time Calculation (min) 42 min    Activity Tolerance Patient tolerated treatment well    Behavior During Therapy [facility] for tasks assessed/performed             Past Medical History:  Diagnosis Date   Allergies    Anxiety    Asthma    cough variant asthma developed in last couple of years   Bipolar 1 disorder ([facility])    Cervical cancer screening 09/12/2016   Colon polyp 09/12/2016   Family history of bladder cancer    Family history of breast cancer    Family history of ovarian cancer    Family history of prostate cancer    Hx of colonic polyp 09/12/2016   Colonoscopy 2017 with 1 small polyp, done by Dr Outlaw, repeat colonoscopy in 5 years, 2022   Hypothyroidism    Preventative health care 07/23/2015   Thyroid disease     Past Surgical History:  Procedure Laterality Date   APPENDECTOMY     BREAST LUMPECTOMY WITH RADIOACTIVE SEED LOCALIZATION Bilateral 02/12/2020   Procedure: BILATERAL BREAST LUMPECTOMY WITH RADIOACTIVE SEED LOCALIZATION;  Surgeon: Toth, Paul III, MD;  Location: [facility];  Service: General;  Laterality: Bilateral;   RE-EXCISION OF BREAST LUMPECTOMY Left 03/04/2020   Procedure: LEFT BREAST MARGIN REEXCISION;  Surgeon: Toth, Paul III, MD;  Location: [facility];  Service: General;  Laterality: Left;   SENTINEL NODE BIOPSY Left 03/04/2020   Procedure: SENTINEL NODE BIOPSY;  Surgeon: Toth, Paul III, MD;  Location: [facility];   Service: General;  Laterality: Left;   SKIN BIOPSY     back moles removed (precancer?)    There were no vitals filed for this visit.   Subjective Assessment - 10/13/20 1303     Subjective I am doing pretty good. The tenderness as gotten better. I don't have to wear the compression bra at night anymore.    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 No/denies    Pain Score 0-No pain                               [facility] Adult PT Treatment/Exercise - 10/13/20 0001       Manual Therapy   Myofascial Release to L axilla where 1 cord is palpable    Manual Lymphatic Drainage (MLD) In Supine: Short neck, 5 diaphragmatic breaths, Lt inguinal nodes,  Lt axillo-inguinal anastomosis, then focued on lateral aspect of Lt breast redirecting towards anastomosis, then finished retracing all steps in supine                            PT Long Term Goals - 09/28/20 1508       PT LONG TERM GOAL #1   Title Pt will be independent in self MLD for long term management of lymphedema.    Baseline 08/30/20- pt is independent    Time 4    Period Weeks    Status Achieved      PT LONG TERM GOAL #2   Title Pt will obtain appropriate compression bra for long term management of left breast lymphedema.    Baseline 08/30/20- pt has received a compression bra    Time 4    Period Weeks    Status Achieved      PT LONG TERM GOAL #3   Title Pt will report a 50% decrease in swelling in L breast to decrease risk of infection.    Baseline 08/30/20- 75% decrease    Time 4    Period Weeks    Status Achieved      PT LONG TERM GOAL #4   Title Pt will report no tightness across L chest with end range L  shoulder ROM to allow improved comfort.    Baseline 08/30/20- pt is continuing to have this; 09/28/20- pt is not only having tightness in L axilla    Time 4    Period Weeks    Status Achieved      PT LONG TERM GOAL #5   Title Pt will report no soreness to palpation or tenderness in L axilla while pt returns to the gym to allow her to return to PLOF.    Time 4    Period Weeks    Status New    Target Date 10/26/20                   Plan - 10/13/20 1343     Clinical Impression Statement Pt is making great progress towards goals in therapy. She is having less tightness and tenderness. Her L breast has good skin mobility and no signs of fibrosis. Pt wishes to wear a regular bra over the next week and then will follow up with pt next week and plan to d/c pending how she does over the next week.    PT Frequency 2x / week    PT Duration 4 weeks    PT Treatment/Interventions ADLs/Self Care Home Management;Patient/family education;Therapeutic exercise;Manual lymph drainage;Manual techniques;Scar mobilization;Taping;Vasopneumatic Device    PT Next Visit Plan d/c? how did pt do wearing a regular bra? MFR to cording in L axilla, MLD PRN to L breast, STM to L serratus    PT Home Exercise Plan Self MLD    Consulted and Agree with Plan of Care Patient             Patient will benefit from skilled therapeutic intervention in order to improve the following deficits and impairments:  Postural dysfunction, Increased fascial restricitons, Decreased scar mobility, Increased edema  Visit Diagnosis: Disorder of the skin and subcutaneous tissue related to radiation, unspecified  Lymphedema, not elsewhere classified  Aftercare following surgery for neoplasm  Stiffness of left shoulder, not elsewhere classified  Abnormal posture  Malignant neoplasm of upper-outer quadrant of left breast in female, estrogen receptor positive ([place])     Problem List Patient Active Problem List   Diagnosis  Date Noted   Left upper lobe pneumonia 08/15/2020   Milia 07/14/2020   Malignant neoplasm of overlapping sites of left breast in female, estrogen receptor positive ([place]) 05/18/2020   Genetic testing 04/07/2020  Hypothyroidism    Anxiety    Allergies    FH: thoracic aortic aneurysm 03/23/2020   Family history of breast cancer    Family history of ovarian cancer    Family history of bladder cancer    Family history of prostate cancer    Breast cancer in female [facility]) 03/09/2020   Bilateral dry eyes 01/27/2019   Cervical cancer screening 09/12/2016   Hx of colonic polyp 09/12/2016   Colon polyp 09/12/2016   Preventative health care 07/23/2015   Asthma    Thyroid disease    Bipolar affective [facility])    Urinary incontinence 12/25/2011    Manus Gunning, PT 10/13/2020, 1:49 PM  [place], [place], [address] Phone: [phone]   Fax:  [phone]  Name: Cheyenne Garner MRN: [id number] Date of Birth: 1965/04/25   Manus Gunning, PT 10/13/20 1:49 PM

## 2020-10-14 DIAGNOSIS — H2513 Age-related nuclear cataract, bilateral: Secondary | ICD-10-CM | POA: Diagnosis not present

## 2020-10-14 DIAGNOSIS — H04551 Acquired stenosis of right nasolacrimal duct: Secondary | ICD-10-CM | POA: Diagnosis not present

## 2020-10-14 DIAGNOSIS — H16223 Keratoconjunctivitis sicca, not specified as Sjogren's, bilateral: Secondary | ICD-10-CM | POA: Diagnosis not present

## 2020-10-18 ENCOUNTER — Encounter: Payer: Self-pay | Admitting: *Deleted

## 2020-10-19 ENCOUNTER — Encounter: Payer: Self-pay | Admitting: Physical Therapy

## 2020-10-19 ENCOUNTER — Other Ambulatory Visit: Payer: Self-pay

## 2020-10-19 ENCOUNTER — Ambulatory Visit: Payer: BC Managed Care – PPO | Attending: General Surgery | Admitting: Physical Therapy

## 2020-10-19 DIAGNOSIS — R293 Abnormal posture: Secondary | ICD-10-CM | POA: Diagnosis not present

## 2020-10-19 DIAGNOSIS — Z483 Aftercare following surgery for neoplasm: Secondary | ICD-10-CM | POA: Insufficient documentation

## 2020-10-19 DIAGNOSIS — Z170 Estrogen receptor positive status [ER+]: Secondary | ICD-10-CM | POA: Insufficient documentation

## 2020-10-19 DIAGNOSIS — L599 Disorder of the skin and subcutaneous tissue related to radiation, unspecified: Secondary | ICD-10-CM | POA: Insufficient documentation

## 2020-10-19 DIAGNOSIS — M25612 Stiffness of left shoulder, not elsewhere classified: Secondary | ICD-10-CM | POA: Diagnosis not present

## 2020-10-19 DIAGNOSIS — I890 Lymphedema, not elsewhere classified: Secondary | ICD-10-CM | POA: Diagnosis not present

## 2020-10-19 DIAGNOSIS — C50412 Malignant neoplasm of upper-outer quadrant of left female breast: Secondary | ICD-10-CM | POA: Insufficient documentation

## 2020-10-19 NOTE — Therapy (Signed)
[place] [facility] [place], [place], [address] Phone:     Fax:     Physical Therapy Treatment  Patient Details  Name: Cheyenne Garner MRN: [id number] Date of Birth: 04-08-1965 Referring Provider (PT): Shona Simpson   Encounter Date: 10/19/2020   PT End of Session - 10/19/20 0856     Visit Number 12    Number of Visits 14    Date for PT Re-Evaluation 10/26/20    PT Start Time 0810    PT Stop Time 0851    PT Time Calculation (min) 41 min    Activity Tolerance Patient tolerated treatment well    Behavior During Therapy [facility] for tasks assessed/performed             Past Medical History:  Diagnosis Date   Allergies    Anxiety    Asthma    cough variant asthma developed in last couple of years   Bipolar 1 disorder ([place])    Cervical cancer screening 09/12/2016   Colon polyp 09/12/2016   Family history of bladder cancer    Family history of breast cancer    Family history of ovarian cancer    Family history of prostate cancer    Hx of colonic polyp 09/12/2016   Colonoscopy 2017 with 1 small polyp, done by Dr [name], repeat colonoscopy in 5 years, 2022   Hypothyroidism    Preventative health care 07/23/2015   Thyroid disease     Past Surgical History:  Procedure Laterality Date   APPENDECTOMY     BREAST LUMPECTOMY WITH RADIOACTIVE SEED LOCALIZATION Bilateral 02/12/2020   Procedure: BILATERAL BREAST LUMPECTOMY WITH RADIOACTIVE SEED LOCALIZATION;  Surgeon: Jovita Kussmaul, MD;  Location: [place];  Service: General;  Laterality: Bilateral;   RE-EXCISION OF BREAST LUMPECTOMY Left 03/04/2020   Procedure: LEFT BREAST MARGIN REEXCISION;  Surgeon: Jovita Kussmaul, MD;  Location: [place];  Service: General;  Laterality: Left;   SENTINEL NODE BIOPSY Left 03/04/2020   Procedure: SENTINEL NODE BIOPSY;  Surgeon: Jovita Kussmaul, MD;  Location: [place];  Service: General;   Laterality: Left;   SKIN BIOPSY     back moles removed (precancer?)    There were no vitals filed for this visit.   Subjective Assessment - 10/19/20 0852     Subjective I didn't swell as much as I thought I woudl by wearing a regular bra instead of compression. Still feels tight under the axilla.    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 No/denies    Pain Score 0-No pain                               [facility] Adult PT Treatment/Exercise - 10/19/20 0001       Manual Therapy   Myofascial Release to L axilla where 2 cords are palpable    Manual Lymphatic Drainage (MLD) In Supine: Short neck, 5 diaphragmatic breaths, Lt inguinal nodes,  Lt axillo-inguinal anastomosis, then focued on lateral aspect of Lt breast redirecting towards anastomosis, then finished retracing  all steps in supine                          PT Long Term Goals - 09/28/20 1508       PT LONG TERM GOAL #1   Title Pt will be independent in self MLD for long term management of lymphedema.    Baseline 08/30/20- pt is independent    Time 4    Period Weeks    Status Achieved      PT LONG TERM GOAL #2   Title Pt will obtain appropriate compression bra for long term management of left breast lymphedema.    Baseline 08/30/20- pt has received a compression bra    Time 4    Period Weeks    Status Achieved      PT LONG TERM GOAL #3   Title Pt will report a 50% decrease in swelling in L breast to decrease risk of infection.    Baseline 08/30/20- 75% decrease    Time 4    Period Weeks    Status Achieved      PT LONG TERM GOAL #4   Title Pt will report no tightness across L chest with end range L  shoulder ROM to allow improved comfort.    Baseline 08/30/20- pt is continuing to have this; 09/28/20- pt is not only having tightness in L axilla    Time 4    Period Weeks    Status Achieved      PT LONG TERM GOAL #5   Title Pt will report no soreness to palpation or tenderness in L axilla while pt returns to the gym to allow her to return to PLOF.    Time 4    Period Weeks    Status New    Target Date 10/26/20                   Plan - 10/19/20 0856     Clinical Impression Statement Pt went for several days without wearing her compression bra to see how her breast did. She reports she had some swelling but it was minimal. Pt also did not exercise over the weekend and did have increased tightness in her axilla due to her cording. Continued myofascial to this area - pt to resume stretching at home and wear compression bra as needed. Pt would benefit from additional skilled PT visits to continue to decrease tightness from cording and decrease swelling in L breast.    PT Frequency 2x / week    PT Duration 4 weeks    PT Treatment/Interventions ADLs/Self Care Home Management;Patient/family education;Therapeutic exercise;Manual lymph drainage;Manual techniques;Scar mobilization;Taping;Vasopneumatic Device    PT Next Visit Plan MFR to cording in L axilla, MLD PRN to L breast, STM to L serratus    PT Home Exercise Plan Self MLD, stretching for cording    Consulted and Agree with Plan of Care Patient             Patient will benefit from skilled therapeutic intervention in order to improve the following deficits and impairments:  Postural dysfunction, Increased fascial restricitons, Decreased scar mobility, Increased edema  Visit Diagnosis: Disorder of the skin and subcutaneous tissue related to radiation, unspecified  Lymphedema, not elsewhere classified  Aftercare following surgery for neoplasm  Stiffness of left shoulder, not elsewhere classified  Abnormal  posture  Malignant neoplasm of upper-outer quadrant of left breast in female, estrogen receptor positive ([facility])  Problem List Patient Active Problem List   Diagnosis Date Noted   Left upper lobe pneumonia 08/15/2020   Milia 07/14/2020   Malignant neoplasm of overlapping sites of left breast in female, estrogen receptor positive ([place]) 05/18/2020   Genetic testing 04/07/2020   Hypothyroidism    Anxiety    Allergies    FH: thoracic aortic aneurysm 03/23/2020   Family history of breast cancer    Family history of ovarian cancer    Family history of bladder cancer    Family history of prostate cancer    Breast cancer in female ([place]) 03/09/2020   Bilateral dry eyes 01/27/2019   Cervical cancer screening 09/12/2016   Hx of colonic polyp 09/12/2016   Colon polyp 09/12/2016   Preventative health care 07/23/2015   Asthma    Thyroid disease    Bipolar affective ([place])    Urinary incontinence 12/25/2011    Manus Gunning, PT 10/19/2020, 8:59 AM  [facility] [place], [place], [address] Phone:     Fax:     Name: Cheyenne Garner MRN: [id number] Date of Birth: Dec 09, 1965   Manus Gunning, PT 10/19/20 8:59 AM

## 2020-10-25 ENCOUNTER — Encounter: Payer: Self-pay | Admitting: Physical Therapy

## 2020-10-25 ENCOUNTER — Other Ambulatory Visit: Payer: Self-pay

## 2020-10-25 ENCOUNTER — Ambulatory Visit: Payer: BC Managed Care – PPO | Admitting: Physical Therapy

## 2020-10-25 ENCOUNTER — Ambulatory Visit: Payer: BC Managed Care – PPO

## 2020-10-25 DIAGNOSIS — C50412 Malignant neoplasm of upper-outer quadrant of left female breast: Secondary | ICD-10-CM | POA: Diagnosis not present

## 2020-10-25 DIAGNOSIS — I890 Lymphedema, not elsewhere classified: Secondary | ICD-10-CM

## 2020-10-25 DIAGNOSIS — Z170 Estrogen receptor positive status [ER+]: Secondary | ICD-10-CM

## 2020-10-25 DIAGNOSIS — M25612 Stiffness of left shoulder, not elsewhere classified: Secondary | ICD-10-CM | POA: Diagnosis not present

## 2020-10-25 DIAGNOSIS — R293 Abnormal posture: Secondary | ICD-10-CM | POA: Diagnosis not present

## 2020-10-25 DIAGNOSIS — L599 Disorder of the skin and subcutaneous tissue related to radiation, unspecified: Secondary | ICD-10-CM | POA: Diagnosis not present

## 2020-10-25 DIAGNOSIS — Z483 Aftercare following surgery for neoplasm: Secondary | ICD-10-CM | POA: Diagnosis not present

## 2020-10-25 NOTE — Therapy (Signed)
[facility] @ [place], [place], [address] Phone: [phone]   Fax:  [phone]  Physical Therapy Treatment  Patient Details  Name: Cheyenne Garner MRN: [id number] Date of Birth: July 16, 1965 Referring Provider (PT): Shona Simpson   Encounter Date: 10/25/2020   PT End of Session - 10/25/20 1355     Visit Number 13    Number of Visits 14    Date for PT Re-Evaluation 10/26/20    PT Start Time 1102    PT Stop Time 1117    PT Time Calculation (min) 47 min    Activity Tolerance Patient tolerated treatment well    Behavior During Therapy [facility] for tasks assessed/performed             Past Medical History:  Diagnosis Date   Allergies    Anxiety    Asthma    cough variant asthma developed in last couple of years   Bipolar 1 disorder ([place])    Cervical cancer screening 09/12/2016   Colon polyp 09/12/2016   Family history of bladder cancer    Family history of breast cancer    Family history of ovarian cancer    Family history of prostate cancer    Hx of colonic polyp 09/12/2016   Colonoscopy 2017 with 1 small polyp, done by Dr [name], repeat colonoscopy in 5 years, 2022   Hypothyroidism    Preventative health care 07/23/2015   Thyroid disease     Past Surgical History:  Procedure Laterality Date   APPENDECTOMY     BREAST LUMPECTOMY WITH RADIOACTIVE SEED LOCALIZATION Bilateral 02/12/2020   Procedure: BILATERAL BREAST LUMPECTOMY WITH RADIOACTIVE SEED LOCALIZATION;  Surgeon: Jovita Kussmaul, MD;  Location: [place];  Service: General;  Laterality: Bilateral;   RE-EXCISION OF BREAST LUMPECTOMY Left 03/04/2020   Procedure: LEFT BREAST MARGIN REEXCISION;  Surgeon: Jovita Kussmaul, MD;  Location: [place];  Service: General;  Laterality: Left;   SENTINEL NODE BIOPSY Left 03/04/2020   Procedure: SENTINEL NODE BIOPSY;  Surgeon: Jovita Kussmaul, MD;  Location: [facility];   Service: General;  Laterality: Left;   SKIN BIOPSY     back moles removed (precancer?)    There were no vitals filed for this visit.   Subjective Assessment - 10/25/20 1327     Subjective My tightness is still there. I think it has improved since our last visit. The swelling still comes and goes.    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 No/denies    Pain Score 0-No pain                    L-DEX FLOWSHEETS - 10/25/20 1400       L-DEX LYMPHEDEMA SCREENING   Measurement Type Unilateral    L-DEX MEASUREMENT EXTREMITY Upper Extremity    POSITION  Standing    DOMINANT SIDE Left    At Risk Side Left    BASELINE SCORE (UNILATERAL) -1.7    L-DEX SCORE (UNILATERAL) -6.9    VALUE CHANGE (UNILAT) -5.2  [place] Adult PT Treatment/Exercise - 10/25/20 0001       Manual Therapy   Manual Therapy Myofascial release;Passive ROM    Myofascial Release to L axilla where 1 cord is palpable and across L pec    Passive ROM to L shoulder in direction of flexion and abduction with distraction to decrease cording                          PT Long Term Goals - 09/28/20 1508       PT LONG TERM GOAL #1   Title Pt will be independent in self MLD for long term management of lymphedema.    Baseline 08/30/20- pt is independent    Time 4    Period Weeks    Status Achieved      PT LONG TERM GOAL #2   Title Pt will obtain appropriate compression bra for long term management of left breast lymphedema.    Baseline 08/30/20- pt has received a compression bra    Time 4    Period Weeks    Status Achieved      PT LONG TERM GOAL #3   Title Pt will report a 50%  decrease in swelling in L breast to decrease risk of infection.    Baseline 08/30/20- 75% decrease    Time 4    Period Weeks    Status Achieved      PT LONG TERM GOAL #4   Title Pt will report no tightness across L chest with end range L shoulder ROM to allow improved comfort.    Baseline 08/30/20- pt is continuing to have this; 09/28/20- pt is not only having tightness in L axilla    Time 4    Period Weeks    Status Achieved      PT LONG TERM GOAL #5   Title Pt will report no soreness to palpation or tenderness in L axilla while pt returns to the gym to allow her to return to PLOF.    Time 4    Period Weeks    Status New    Target Date 10/26/20                   Plan - 10/25/20 1355     Clinical Impression Statement Pt reports she is still having tightness from cording in her L axilla. She has been stretching and reports it has improved some since last session. Her swelling is being controlled with her compression bra. Will place pt on hold for 2 weeks to see if she is able to manage the tightness through stretching at home. Remeasured SOZO today and it has decreased significantly from baseline so she does not demonstrate any signs of lymphedema. Will remeasure again in 3 months.    PT Frequency 2x / week    PT Duration 4 weeks    PT Treatment/Interventions ADLs/Self Care Home Management;Patient/family education;Therapeutic exercise;Manual lymph drainage;Manual techniques;Scar mobilization;Taping;Vasopneumatic Device    PT Next Visit Plan MFR to cording in L axilla, MLD PRN to L breast, STM to L serratus, continue SOZO every 3 months    PT Home Exercise Plan Self MLD, stretching for cording    Consulted and Agree with Plan of Care Patient             Patient will benefit from skilled therapeutic intervention in order to improve the following deficits and impairments:  Postural dysfunction, Increased fascial restricitons, Decreased scar mobility,  Increased edema  Visit  Diagnosis: Disorder of the skin and subcutaneous tissue related to radiation, unspecified  Lymphedema, not elsewhere classified  Aftercare following surgery for neoplasm  Stiffness of left shoulder, not elsewhere classified  Abnormal posture  Malignant neoplasm of upper-outer quadrant of left breast in female, estrogen receptor positive ([place])     Problem List Patient Active Problem List   Diagnosis Date Noted   Left upper lobe pneumonia 08/15/2020   [place] 07/14/2020   Malignant neoplasm of overlapping sites of left breast in female, estrogen receptor positive ([place]) 05/18/2020   Genetic testing 04/07/2020   Hypothyroidism    Anxiety    Allergies    FH: thoracic aortic aneurysm 03/23/2020   Family history of breast cancer    Family history of ovarian cancer    Family history of bladder cancer    Family history of prostate cancer    Breast cancer in female ([place]) 03/09/2020   Bilateral dry eyes 01/27/2019   Cervical cancer screening 09/12/2016   Hx of colonic polyp 09/12/2016   Colon polyp 09/12/2016   Preventative health care 07/23/2015   Asthma    Thyroid disease    Bipolar affective ([place])    Urinary incontinence 12/25/2011    Manus Gunning, PT 10/25/2020, 2:02 PM  [facility] @ [place], [place], [address] Phone: [phone]   Fax:  [phone]  Name: DEBRAH GRANDERSON MRN: [id number] Date of Birth: 01/15/1966  Manus Gunning, PT 10/25/20 2:02 PM

## 2020-11-02 ENCOUNTER — Ambulatory Visit
Admission: RE | Admit: 2020-11-02 | Discharge: 2020-11-02 | Disposition: A | Discharge status: Home / Self Care | Payer: BC Managed Care – PPO | Source: Ambulatory Visit | Attending: Oncology | Admitting: Oncology

## 2020-11-02 ENCOUNTER — Other Ambulatory Visit: Payer: Self-pay

## 2020-11-02 DIAGNOSIS — C50911 Malignant neoplasm of unspecified site of right female breast: Secondary | ICD-10-CM

## 2020-11-02 DIAGNOSIS — Z170 Estrogen receptor positive status [ER+]: Secondary | ICD-10-CM

## 2020-11-02 DIAGNOSIS — R922 Inconclusive mammogram: Secondary | ICD-10-CM | POA: Diagnosis not present

## 2020-11-02 DIAGNOSIS — C50812 Malignant neoplasm of overlapping sites of left female breast: Secondary | ICD-10-CM

## 2020-11-02 HISTORY — DX: Personal history of irradiation: Z92.3

## 2020-11-08 ENCOUNTER — Encounter: Payer: Self-pay | Admitting: Family Medicine

## 2020-11-08 ENCOUNTER — Ambulatory Visit (INDEPENDENT_AMBULATORY_CARE_PROVIDER_SITE_OTHER): Payer: BC Managed Care – PPO | Admitting: Family Medicine

## 2020-11-08 ENCOUNTER — Encounter: Payer: Self-pay | Admitting: Physical Therapy

## 2020-11-08 ENCOUNTER — Telehealth: Payer: Self-pay | Admitting: Oncology

## 2020-11-08 ENCOUNTER — Other Ambulatory Visit: Payer: Self-pay

## 2020-11-08 ENCOUNTER — Ambulatory Visit: Payer: BC Managed Care – PPO | Admitting: Physical Therapy

## 2020-11-08 VITALS — BP 110/68 | HR 65 | Temp 98.2°F | Resp 16 | Wt 137.2 lb

## 2020-11-08 DIAGNOSIS — R293 Abnormal posture: Secondary | ICD-10-CM | POA: Diagnosis not present

## 2020-11-08 DIAGNOSIS — R700 Elevated erythrocyte sedimentation rate: Secondary | ICD-10-CM

## 2020-11-08 DIAGNOSIS — C50412 Malignant neoplasm of upper-outer quadrant of left female breast: Secondary | ICD-10-CM

## 2020-11-08 DIAGNOSIS — Z483 Aftercare following surgery for neoplasm: Secondary | ICD-10-CM | POA: Diagnosis not present

## 2020-11-08 DIAGNOSIS — E039 Hypothyroidism, unspecified: Secondary | ICD-10-CM | POA: Diagnosis not present

## 2020-11-08 DIAGNOSIS — I890 Lymphedema, not elsewhere classified: Secondary | ICD-10-CM

## 2020-11-08 DIAGNOSIS — E079 Disorder of thyroid, unspecified: Secondary | ICD-10-CM | POA: Diagnosis not present

## 2020-11-08 DIAGNOSIS — J189 Pneumonia, unspecified organism: Secondary | ICD-10-CM | POA: Diagnosis not present

## 2020-11-08 DIAGNOSIS — F319 Bipolar disorder, unspecified: Secondary | ICD-10-CM

## 2020-11-08 DIAGNOSIS — L599 Disorder of the skin and subcutaneous tissue related to radiation, unspecified: Secondary | ICD-10-CM | POA: Diagnosis not present

## 2020-11-08 DIAGNOSIS — C50911 Malignant neoplasm of unspecified site of right female breast: Secondary | ICD-10-CM

## 2020-11-08 DIAGNOSIS — M25612 Stiffness of left shoulder, not elsewhere classified: Secondary | ICD-10-CM

## 2020-11-08 DIAGNOSIS — R739 Hyperglycemia, unspecified: Secondary | ICD-10-CM | POA: Diagnosis not present

## 2020-11-08 DIAGNOSIS — Z170 Estrogen receptor positive status [ER+]: Secondary | ICD-10-CM

## 2020-11-08 DIAGNOSIS — Z23 Encounter for immunization: Secondary | ICD-10-CM | POA: Diagnosis not present

## 2020-11-08 DIAGNOSIS — C50912 Malignant neoplasm of unspecified site of left female breast: Secondary | ICD-10-CM

## 2020-11-08 DIAGNOSIS — F419 Anxiety disorder, unspecified: Secondary | ICD-10-CM

## 2020-11-08 LAB — SEDIMENTATION RATE: Sed Rate: 6 mm/hr (ref 0–30)

## 2020-11-08 LAB — HEMOGLOBIN A1C: Hgb A1c MFr Bld: 4.9 % (ref 4.6–6.5)

## 2020-11-08 LAB — TSH: TSH: 0.57 u[IU]/mL (ref 0.35–5.50)

## 2020-11-08 NOTE — Telephone Encounter (Signed)
Rescheduled per 10/24 in basket, message has been left with pt

## 2020-11-08 NOTE — Assessment & Plan Note (Signed)
On Levothyroxine, continue to monitor 

## 2020-11-08 NOTE — Assessment & Plan Note (Signed)
Proceed with hgba1c today to further evaluate

## 2020-11-08 NOTE — Progress Notes (Signed)
Subjective:   By signing my name below, I, Zite Okoli, attest that this documentation has been prepared under the direction and in the presence of Mosie Lukes, MD. 11/08/2020    Patient ID: Cheyenne Garner, female    DOB: 1965-08-21, 55 y.o.   MRN: [id number]  No chief complaint on file.   HPI Patient is in today for an office visit and 6 month f/u.  She reports she is doing well. She denies fatigue and is feeling more like herself.   She denies cough, wheezing, shortness of breath and chest pain.  She received the flu vaccine. She is interested in getting the pneumonia vaccine. She has 2 Pfizer Covid-19 vaccines at this time.  She has a colonoscopy scheduled in December 2022.  Past Medical History:  Diagnosis Date   Allergies    Anxiety    Asthma    cough variant asthma developed in last couple of years   Bipolar 1 disorder ([place])    Breast cancer ([place]) 02/12/2020   left lumpectomy IDC w/radiation no chemo   Cervical cancer screening 09/12/2016   Colon polyp 09/12/2016   Family history of bladder cancer    Family history of breast cancer    Family history of ovarian cancer    Family history of prostate cancer    Hx of colonic polyp 09/12/2016   Colonoscopy 2017 with 1 small polyp, done by Dr [name], repeat colonoscopy in 5 years, 2022   Hypothyroidism    Personal history of radiation therapy    Preventative health care 07/23/2015   Thyroid disease     Past Surgical History:  Procedure Laterality Date   APPENDECTOMY     BREAST LUMPECTOMY WITH RADIOACTIVE SEED LOCALIZATION Bilateral 02/12/2020   Procedure: BILATERAL BREAST LUMPECTOMY WITH RADIOACTIVE SEED LOCALIZATION;  Surgeon: Jovita Kussmaul, MD;  Location: [facility];  Service: General;  Laterality: Bilateral;   RE-EXCISION OF BREAST LUMPECTOMY Left 03/04/2020   Procedure: LEFT BREAST MARGIN REEXCISION;  Surgeon: Jovita Kussmaul, MD;  Location: [facility];  Service: General;   Laterality: Left;   SENTINEL NODE BIOPSY Left 03/04/2020   Procedure: SENTINEL NODE BIOPSY;  Surgeon: Jovita Kussmaul, MD;  Location: [facility];  Service: General;  Laterality: Left;   SKIN BIOPSY     back moles removed (precancer?)    Family History  Problem Relation Age of Onset   Arthritis Mother    Dementia Mother    Hypertension Father    Heart disease Father        MI, s/p triple bypass at age 35   Neuropathy Father        toxic peripheral    Cancer Paternal Grandfather        young, lung cancer?, heavy smoker   Cholelithiasis Sister    Aortic aneurysm Sister 70   Aneurysm Sister        descending aortic    Mental retardation Sister        ADD, schizoaffective d/o   Obesity Sister    Arthritis Brother        back disease   Ovarian cancer Cousin 3       maternal second cousin (MGF's great-niece)   Dementia Maternal Grandmother    Osteoarthritis Maternal Grandmother    Macular degeneration Maternal Grandfather    Osteoarthritis Maternal Grandfather    Congestive Heart Failure Paternal Grandmother    Breast cancer Cousin 39  paternal first cousin   Prostate cancer Paternal Uncle        spread to bladder, d. 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 No major dietary restrictions   Social Determinants of Radio broadcast assistant  Strain: Not on file  Food Insecurity: Not on file  Transportation Needs: Not on file  Physical Activity: Not on file  Stress: Not on file  Social Connections: Not on file  Intimate Partner Violence: Not on file    Outpatient Medications Prior to Visit  Medication Sig Dispense Refill   albuterol (VENTOLIN HFA) 108 (90 Base) MCG/ACT inhaler Inhale 2 puffs into the lungs every 6 (six) hours as needed for wheezing or shortness of breath. 1 Inhaler 2   Cholecalciferol (VITAMIN D-3) 125 MCG (5000 UT) TABS Take 1 tablet by mouth daily at 6 (six) AM.     lithium 300 MG tablet Take 300 mg by mouth 2 (two) times daily.     Probiotic Product (PROBIOTIC-10 PO) Take 1 capsule by mouth daily.     thyroid (NP THYROID) 90 MG tablet Take 90 mg by mouth daily.     Zinc 50 MG TABS Take 50 mg by mouth daily.     albuterol (PROVENTIL) (2.5 MG/3ML) 0.083% nebulizer solution Take 3 mLs (2.5 mg total) by nebulization every 6 (six) hours as needed for wheezing or shortness of breath. 150 mL 1   beclomethasone (QVAR REDIHALER) 80 MCG/ACT inhaler Inhale 1 puff into the lungs 2 (two) times daily. 1 each 0   cycloSPORINE (RESTASIS) 0.05 % ophthalmic emulsion Place 1 drop into both eyes 2 (two) times daily.     predniSONE (DELTASONE) 20 MG tablet Take 20- 40 mg daily for up to 5 days 10 tablet 0   No facility-administered medications prior to visit.    Allergies  Allergen Reactions   Gentamycin [Gentamicin]     Reacted to eye ointment, swelling red painful eyes    Review of Systems  Constitutional:  Negative for fever and malaise/fatigue.  HENT:  Negative for congestion.   Eyes:  Negative for redness.  Respiratory:  Negative for shortness of breath.   Cardiovascular:  Negative for chest pain, palpitations and leg swelling.  Gastrointestinal:  Negative for abdominal pain, blood in stool and nausea.  Genitourinary:  Negative for dysuria and frequency.  Musculoskeletal:  Negative for falls.  Skin:  Negative  for rash.  Neurological:  Negative for dizziness, loss of consciousness and headaches.  Endo/Heme/Allergies:  Negative for polydipsia.  Psychiatric/Behavioral:  Negative for depression. The patient is not nervous/anxious.       Objective:    Physical Exam Constitutional:      General: She is not in acute  distress.    Appearance: She is well-developed.  HENT:     Head: Normocephalic and atraumatic.  Eyes:     Conjunctiva/sclera: Conjunctivae normal.  Neck:     Thyroid: No thyromegaly.  Cardiovascular:     Rate and Rhythm: Normal rate and regular rhythm.     Heart sounds: Normal heart sounds. No murmur heard. Pulmonary:     Effort: Pulmonary effort is normal. No respiratory distress.     Breath sounds: Normal breath sounds.  Abdominal:     General: Bowel sounds are normal. There is no distension.     Palpations: Abdomen is soft. There is no mass.     Tenderness: There is no abdominal tenderness.  Musculoskeletal:     Cervical back: Neck supple.  Lymphadenopathy:     Cervical: No cervical adenopathy.  Skin:    General: Skin is warm and dry.  Neurological:     Mental Status: She is alert and oriented to person, place, and time.  Psychiatric:        Behavior: Behavior normal.    BP 110/68   Pulse 65   Temp 98.2 F (36.8 C)   Resp 16   Wt 137 lb 3.2 oz (62.2 kg)   SpO2 97%   BMI 22.14 kg/m  Wt Readings from Last 3 Encounters:  11/08/20 137 lb 3.2 oz (62.2 kg)  08/18/20 136 lb (61.7 kg)  08/12/20 135 lb 12.8 oz (61.6 kg)    Diabetic Foot Exam - Simple   No data filed    Lab Results  Component Value Date   WBC 5.1 08/12/2020   HGB 12.5 08/12/2020   HCT 37.4 08/12/2020   PLT 153.0 08/12/2020   GLUCOSE 103 (H) 08/12/2020   CHOL 153 03/18/2020   TRIG 53.0 03/18/2020   HDL 65.00 03/18/2020   LDLCALC 78 03/18/2020   ALT 9 08/12/2020   AST 11 08/12/2020   NA 141 08/12/2020   K 4.1 08/12/2020   CL 103 08/12/2020   CREATININE 0.85 08/12/2020   BUN 15  08/12/2020   CO2 31 08/12/2020   TSH 0.78 08/10/2020    Lab Results  Component Value Date   TSH 0.78 08/10/2020   Lab Results  Component Value Date   WBC 5.1 08/12/2020   HGB 12.5 08/12/2020   HCT 37.4 08/12/2020   MCV 91.3 08/12/2020   PLT 153.0 08/12/2020   Lab Results  Component Value Date   NA 141 08/12/2020   K 4.1 08/12/2020   CO2 31 08/12/2020   GLUCOSE 103 (H) 08/12/2020   BUN 15 08/12/2020   CREATININE 0.85 08/12/2020   BILITOT 0.4 08/12/2020   ALKPHOS 69 08/12/2020   AST 11 08/12/2020   ALT 9 08/12/2020   PROT 6.5 08/12/2020   ALBUMIN 3.7 08/12/2020   CALCIUM 9.4 08/12/2020   ANIONGAP 10 03/16/2020   EGFR 73 04/21/2020   GFR 77.10 08/12/2020   Lab Results  Component Value Date   CHOL 153 03/18/2020   Lab Results  Component Value Date   HDL 65.00 03/18/2020   Lab Results  Component Value Date   LDLCALC 78 03/18/2020   Lab Results  Component Value Date   TRIG 53.0 03/18/2020   Lab Results  Component Value Date   CHOLHDL 2 03/18/2020   No results found for: HGBA1C     Assessment & Plan:   Problem List Items Addressed This Visit     Thyroid disease - Primary   Relevant Orders  TSH   Bipolar affective ([place])    Stable on Lithium      Breast cancer in female [facility])    Is following with oncology and leaning towards starting on Tamoxifen. Given Prevnar 20 today      Hypothyroidism    On Levothyroxine, continue to monitor      Anxiety    Overall she is feeling well but did just have to struggle with her son having emergency appendectomy after she had just worked a long shift. Overall she feels she is managing her stress adequately      Left upper lobe pneumonia    Patient feels fully recovered no breathing concerns, fevers or SOB. Exam CTA today. Patient hesitant to pursue further radiologic studies at this time. Will proceed if symptoms worsen again.       Relevant Orders   Pneumococcal conjugate vaccine 20-valent (Prevnar 20)  (Completed)   Hyperglycemia    Proceed with hgba1c today to further evaluate      Relevant Orders   Hemoglobin A1c   Elevated sed rate    Recheck level today now that she is feeling better.       Relevant Orders   Sedimentation rate    No orders of the defined types were placed in this encounter.   I,Zite Okoli,acting as a Education administrator for Penni Homans, MD.,have documented all relevant documentation on the behalf of Penni Homans, MD,as directed by  Penni Homans, MD while in the presence of Penni Homans, MD.   I, Mosie Lukes, MD., personally preformed the services described in this documentation.  All medical record entries made by the scribe were at my direction and in my presence.  I have reviewed the chart and discharge instructions (if applicable) and agree that the record reflects my personal performance and is accurate and complete. 11/08/2020

## 2020-11-08 NOTE — Patient Instructions (Addendum)
Paxlovid and Molnupiravir are the new COVID medication we can give you if you get COVID so make sure you test if you have symptoms because we have to treat by day 5 of symptoms for it to be effective. If you are positive let us know so we can treat. If a home test is negative and your symptoms are persistent get a PCR test. Can check testing locations at [email or web address] If you are positive we will make an appointment with [place] and we will send in Paxlovid or Molnupiravir if you would like it. Check with your pharmacy before we meet to confirm they have it in stock, if they do not then we can get the prescription at the [facility]    Hypothyroidism Hypothyroidism is when the thyroid gland does not make enough of certain hormones (it is underactive). The thyroid gland is a small gland located in the lower front part of the neck, just in front of the windpipe (trachea). This gland makes hormones that help control how the body uses food for energy (metabolism) as well as how the heart and brain function. These hormones also play a role in keeping your bones strong. When the thyroid is underactive, it produces too little of the hormones thyroxine (T4) and triiodothyronine (T3). What are the causes? This condition may be caused by: Hashimoto's disease. This is a disease in which the body's disease-fighting system (immune system) attacks the thyroid gland. This is the most common cause. Viral infections. Pregnancy. Certain medicines. Birth defects. Past radiation treatments to the head or neck for cancer. Past treatment with radioactive iodine. Past exposure to radiation in the environment. Past surgical removal of part or all of the thyroid. Problems with a gland in the center of the brain (pituitary gland). Lack of enough iodine in the diet. What increases the risk? You are more likely to develop this condition if: You are female. You have a family history of thyroid  conditions. You use a medicine called lithium. You take medicines that affect the immune system (immunosuppressants). What are the signs or symptoms? Symptoms of this condition include: Feeling as though you have no energy (lethargy). Not being able to tolerate cold. Weight gain that is not explained by a change in diet or exercise habits. Lack of appetite. Dry skin. Coarse hair. Menstrual irregularity. Slowing of thought processes. Constipation. Sadness or depression. How is this diagnosed? This condition may be diagnosed based on: Your symptoms, your medical history, and a physical exam. Blood tests. You may also have imaging tests, such as an ultrasound or MRI. How is this treated? This condition is treated with medicine that replaces the thyroid hormones that your body does not make. After you begin treatment, it may take several weeks for symptoms to go away. Follow these instructions at home: Take over-the-counter and prescription medicines only as told by your health care provider. If you start taking any new medicines, tell your health care provider. Keep all follow-up visits as told by your health care provider. This is important. As your condition improves, your dosage of thyroid hormone medicine may change. You will need to have blood tests regularly so that your health care provider can monitor your condition. Contact a health care provider if: Your symptoms do not get better with treatment. You are taking thyroid hormone replacement medicine and you: Sweat a lot. Have tremors. Feel anxious. Lose weight rapidly. Cannot tolerate heat. Have emotional swings. Have diarrhea. Feel weak. Get help right away if  you have: Chest pain. An irregular heartbeat. A rapid heartbeat. Difficulty breathing. Summary Hypothyroidism is when the thyroid gland does not make enough of certain hormones (it is underactive). When the thyroid is underactive, it produces too little of  the hormones thyroxine (T4) and triiodothyronine (T3). The most common cause is Hashimoto's disease, a disease in which the body's disease-fighting system (immune system) attacks the thyroid gland. The condition can also be caused by viral infections, medicine, pregnancy, or past radiation treatment to the head or neck. Symptoms may include weight gain, dry skin, constipation, feeling as though you do not have energy, and not being able to tolerate cold. This condition is treated with medicine to replace the thyroid hormones that your body does not make. This information is not intended to replace advice given to you by your health care provider. Make sure you discuss any questions you have with your health care provider. Document Revised: 10/03/2019 Document Reviewed: 09/18/2019 Elsevier Patient Education  2022 [employer].

## 2020-11-08 NOTE — Assessment & Plan Note (Signed)
Patient feels fully recovered no breathing concerns, fevers or SOB. Exam CTA today. Patient hesitant to pursue further radiologic studies at this time. Will proceed if symptoms worsen again.

## 2020-11-08 NOTE — Therapy (Signed)
[facility] @ [place], [place], [address] Phone: [phone]   Fax:  [phone]  Physical Therapy Treatment  Patient Details  Name: Cheyenne Garner MRN: [id number] Date of Birth: [birth year] Referring Provider (PT): Shona Simpson   Encounter Date: 11/08/2020   PT End of Session - 11/08/20 1451     Visit Number 14    Number of Visits 14    Date for PT Re-Evaluation 10/26/20    PT Start Time 1406    PT Stop Time 7588    PT Time Calculation (min) 52 min    Activity Tolerance Patient tolerated treatment well    Behavior During Therapy [facility] for tasks assessed/performed             Past Medical History:  Diagnosis Date   Allergies    Anxiety    Asthma    cough variant asthma developed in last couple of years   Bipolar 1 disorder ([place])    Breast cancer ([place]) 02/12/2020   left lumpectomy IDC w/radiation no chemo   Cervical cancer screening 09/12/2016   Colon polyp 09/12/2016   Family history of bladder cancer    Family history of breast cancer    Family history of ovarian cancer    Family history of prostate cancer    Hx of colonic polyp 09/12/2016   Colonoscopy 2017 with 1 small polyp, done by Dr [name], repeat colonoscopy in 5 years, 2022   Hypothyroidism    Personal history of radiation therapy    Preventative health care 07/23/2015   Thyroid disease     Past Surgical History:  Procedure Laterality Date   APPENDECTOMY     BREAST LUMPECTOMY WITH RADIOACTIVE SEED LOCALIZATION Bilateral 02/12/2020   Procedure: BILATERAL BREAST LUMPECTOMY WITH RADIOACTIVE SEED LOCALIZATION;  Surgeon: Jovita Kussmaul, MD;  Location: [facility];  Service: General;  Laterality: Bilateral;   RE-EXCISION OF BREAST LUMPECTOMY Left 03/04/2020   Procedure: LEFT BREAST MARGIN REEXCISION;  Surgeon: Jovita Kussmaul, MD;  Location: [facility];  Service: General;  Laterality: Left;   SENTINEL NODE  BIOPSY Left 03/04/2020   Procedure: SENTINEL NODE BIOPSY;  Surgeon: Jovita Kussmaul, MD;  Location: [facility];  Service: General;  Laterality: Left;   SKIN BIOPSY     back moles removed (precancer?)    There were no vitals filed for this visit.   Subjective Assessment - 11/08/20 1408     Subjective My tightness has improved. I have a little bit that is still there. My swelling is doing better. I have been able to maintain after the past few weeks.    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 No/denies    Pain Score 0-No pain                               [facility] Adult PT Treatment/Exercise - 11/08/20 0001       Exercises   Exercises Other Exercises    Other Exercises  educated pt in lying over foam roll with arms outstretched for pec  stretch and educated her on how to obtain a foam roll      Manual Therapy   Myofascial Release to L axilla in area of cording                          PT Long Term Goals - 11/08/20 1410       PT LONG TERM GOAL #1   Title Pt will be independent in self MLD for long term management of lymphedema.    Baseline 08/30/20- pt is independent    Time 4    Period Weeks    Status Achieved      PT LONG TERM GOAL #2   Title Pt will obtain appropriate compression bra for long term management of left breast lymphedema.    Baseline 08/30/20- pt has received a compression bra    Period Weeks    Status Achieved      PT LONG TERM GOAL #3   Title Pt will report a 50% decrease in swelling in L breast to decrease risk of infection.    Baseline 08/30/20- 75% decrease    Time 4    Period Weeks    Status Achieved      PT LONG TERM  GOAL #4   Title Pt will report no tightness across L chest with end range L shoulder ROM to allow improved comfort.    Baseline 08/30/20- pt is continuing to have this; 09/28/20- pt is not only having tightness in L axilla    Time 4    Period Weeks    Status Achieved      PT LONG TERM GOAL #5   Title Pt will report no soreness to palpation or tenderness in L axilla while pt returns to the gym to allow her to return to PLOF.    Baseline 11/08/20- pt reports she has some soreness but that it has been improving over the last few weeks    Time 4    Period Weeks    Status Partially Met                   Plan - 11/08/20 1453     Clinical Impression Statement Assessed pt's progress towards goals in therapy. Pt has now met all goals for therapy. She still has some tightness in her axilla but it is improving. She reports it is not interfering with anything. Educated pt in foam roll stretch and how to obtain foam roll. Continued with myofascial release to L axilla. Pt is now ready for discharge from skilled PT services.    PT Frequency 2x / week    PT Duration 4 weeks    PT Treatment/Interventions ADLs/Self Care Home Management;Patient/family education;Therapeutic exercise;Manual lymph drainage;Manual techniques;Scar mobilization;Taping;Vasopneumatic Device    PT Next Visit Plan d/c this visit, continue SOZO every 3 months    PT Home Exercise Plan Self MLD, stretching for cording    Consulted and Agree with Plan of Care Patient             Patient will benefit from skilled therapeutic intervention in order to improve the following deficits and impairments:  Postural dysfunction, Increased fascial restricitons, Decreased scar mobility, Increased edema  Visit Diagnosis: Disorder of the skin and subcutaneous tissue related to radiation, unspecified  Aftercare following surgery for neoplasm  Lymphedema, not elsewhere classified  Stiffness of left shoulder, not elsewhere  classified  Abnormal posture  Malignant neoplasm of upper-outer quadrant of left  breast in female, estrogen receptor positive ([place])     Problem List Patient Active Problem List   Diagnosis Date Noted   Hyperglycemia 11/08/2020   Elevated sed rate 11/08/2020   Left upper lobe pneumonia 08/15/2020   [place] 07/14/2020   Malignant neoplasm of overlapping sites of left breast in female, estrogen receptor positive ([place]) 05/18/2020   Genetic testing 04/07/2020   Hypothyroidism    Anxiety    Allergies    FH: thoracic aortic aneurysm 03/23/2020   Family history of breast cancer    Family history of ovarian cancer    Family history of bladder cancer    Family history of prostate cancer    Breast cancer in female ([place]) 03/09/2020   Bilateral dry eyes 01/27/2019   Cervical cancer screening 09/12/2016   Hx of colonic polyp 09/12/2016   Colon polyp 09/12/2016   Preventative health care 07/23/2015   Asthma    Thyroid disease    Bipolar affective [facility]    Urinary incontinence 12/25/2011    Manus Gunning, PT 11/08/2020, 3:02 PM  [place] @ [place], [place], [address] Phone: [phone]   Fax:  [phone]  Name: KAYDE ATKERSON MRN: [id number] Date of Birth: 08-May-1965   PHYSICAL THERAPY DISCHARGE SUMMARY  Visits from Start of Care: 14  Current functional level related to goals / functional outcomes: All goals met   Remaining deficits: Pt still has some cording in left axilla that does not limit function   Education / Equipment: HEP, compression, MLD   Patient agrees to discharge. Patient goals were met. Patient is being discharged due to meeting the stated rehab goals.  Allyson Sabal [place], [place] 11/08/20 3:02 PM

## 2020-11-08 NOTE — Assessment & Plan Note (Signed)
Recheck level today now that she is feeling better.

## 2020-11-08 NOTE — Assessment & Plan Note (Signed)
Overall she is feeling well but did just have to struggle with her son having emergency appendectomy after she had just worked a long shift. Overall she feels she is managing her stress adequately

## 2020-11-08 NOTE — Assessment & Plan Note (Signed)
Stable on Lithium

## 2020-11-08 NOTE — Assessment & Plan Note (Signed)
Is following with oncology and leaning towards starting on Tamoxifen. Given Prevnar 20 today

## 2020-11-17 ENCOUNTER — Inpatient Hospital Stay: Payer: BC Managed Care – PPO | Admitting: Oncology

## 2020-11-17 ENCOUNTER — Inpatient Hospital Stay: Payer: BC Managed Care – PPO

## 2020-12-20 DIAGNOSIS — Z5181 Encounter for therapeutic drug level monitoring: Secondary | ICD-10-CM | POA: Diagnosis not present

## 2020-12-27 ENCOUNTER — Ambulatory Visit: Payer: BC Managed Care – PPO | Attending: Internal Medicine

## 2020-12-27 ENCOUNTER — Other Ambulatory Visit (HOSPITAL_BASED_OUTPATIENT_CLINIC_OR_DEPARTMENT_OTHER): Payer: Self-pay

## 2020-12-27 DIAGNOSIS — Z23 Encounter for immunization: Secondary | ICD-10-CM

## 2020-12-27 MED ORDER — PFIZER COVID-19 VAC BIVALENT 30 MCG/0.3ML IM SUSP
INTRAMUSCULAR | 0 refills | Status: DC
  Filled 2020-12-27: qty 0.3, 1d supply, fill #0

## 2020-12-27 NOTE — Progress Notes (Signed)
   Covid-19 Vaccination Clinic  Name:  Cheyenne Garner    MRN: [id number] DOB: [birth year]  12/27/2020  [name] was observed post Covid-19 immunization for 15 minutes without incident. She was provided with Vaccine Information Sheet and instruction to access the V-Safe system.   [name] was instructed to call 911 with any severe reactions post vaccine: Difficulty breathing  Swelling of face and throat  A fast heartbeat  A bad rash all over body  Dizziness and weakness   Immunizations Administered     Name Date Dose VIS Date Route   Pfizer Covid-19 Vaccine Bivalent Booster 12/27/2020 11:03 AM 0.3 mL 09/15/2020 Intramuscular   Manufacturer: [place]   Lot: [id number]   [place]: [phone]

## 2020-12-29 DIAGNOSIS — D125 Benign neoplasm of sigmoid colon: Secondary | ICD-10-CM | POA: Diagnosis not present

## 2020-12-29 DIAGNOSIS — Z86010 Personal history of colonic polyps: Secondary | ICD-10-CM | POA: Diagnosis not present

## 2020-12-29 DIAGNOSIS — F3111 Bipolar disorder, current episode manic without psychotic features, mild: Secondary | ICD-10-CM | POA: Diagnosis not present

## 2020-12-29 DIAGNOSIS — D120 Benign neoplasm of cecum: Secondary | ICD-10-CM | POA: Diagnosis not present

## 2020-12-29 LAB — HM COLONOSCOPY

## 2021-01-04 ENCOUNTER — Other Ambulatory Visit: Payer: Self-pay

## 2021-01-04 DIAGNOSIS — Z170 Estrogen receptor positive status [ER+]: Secondary | ICD-10-CM

## 2021-01-04 NOTE — Progress Notes (Signed)
[place]  [phone] [phone]     ID: Cheyenne Garner DOB: 1965/12/17  MR#: [id number]  UPJ#:[id number]  Patient Care Team: Mosie Lukes, MD as PCP - General (Family Medicine) Berniece Salines, DO as PCP - Cardiology (Cardiology) Jovita Kussmaul, MD as Consulting Physician (General Surgery) Jenan Ellegood, Virgie Dad, MD as Consulting Physician (Oncology) Kyung Rudd, MD as Consulting Physician (Radiation Oncology) Arta Silence, MD as Consulting Physician (Gastroenterology) [place], Jennefer Bravo, MD as Referring Physician (Dermatology) Rockwell Germany, RN as Oncology Nurse Navigator Mauro Kaufmann, RN as Oncology Nurse Navigator Paula Compton, MD as Consulting Physician (Obstetrics and Gynecology) Chauncey Cruel, MD OTHER MD:  CHIEF COMPLAINT: Estrogen receptor positive breast cancer  CURRENT TREATMENT: Tamoxifen   INTERVAL HISTORY: Cheyenne Garner returns today for follow up of her estrogen receptor positive breast cancer.  She is accompanied by her husband  Since her last visit, she underwent bilateral diagnostic mammography with tomography at [facility] on 11/02/2020 showing: breast density category C; no evidence of malignancy in either breast.   Since the last visit she has been doing more thinking and also discussed the issue with her gynecologist and surgeon and has decided to give tamoxifen a try.   REVIEW OF SYSTEMS: Cheyenne Garner opted against antiestrogens and instead started a strict vegetarian diet and exercise.  After 4 months as she added fish and now she is eating also some chicken and other white meat.  She is not eating pork or much red meat.  Overall even though this is not strictly vegetarian she is eating a lot more vegetables and it sounds like an optimal diet to make.  She is also exercising regularly, going to the gym 3-4 times a week doing kickboxing and strength exercises.  He tells me she was diagnosed with pneumonia in  July, in the left upper lobe (this is an area that may have received some radiation from her breast radiation).  She was treated with Augmentin with no benefit.  She then received inhalers and steroids and finally some prednisone which did the trick.  She tells me her breathing is now back to baseline.  He also had some swelling of the left breast.  She went to rehab and is now using a compression bra very successfully.  Aside from these issues a detailed review of systems today was stable.   COVID 19 VACCINATION STATUS: Pfizer x1 as of December 2022   HISTORY OF CURRENT ILLNESS: From the original intake note:  Cheyenne Garner was initially evaluated in 05/2017 for a right nipple/breast mass. Diagnostic mammography showed no evidence of malignancy, and she was subsequently referred to Dr. Marlou Starks for further evaluation. She elected for follow up of the lesion. She was then seen by dermatology, who drained the lesion. The lesion resolved but began to reoccur 6 months later. She underwent bilateral diagnostic mammography with tomography and right breast ultrasonography at [facility] on 10/21/2019 showing: breast density category C; dilated duct and nipple discharge from right breast; no intraductal mass or mammographic abnormality identified; right axilla negative for adenopathy.  She underwent breast MRI on 11/11/2019 showing: breast composition C; 1.8 cm linear clumped non-mass enhancement within lower right breast, 6 o'clock; 8 mm irregular enhancing mass within upper-outer left breast; benign-appearing 1.5 cm nonenhancing fluid-intensity mass/collection within right nipple with probable subareolar extension, corresponding to clinical area of concern.  Accordingly on 12/04/2019 she proceeded to biopsy of the bilateral breast areas in question. The  pathology from this procedure ([id number]) showed:  1. Right Breast  - fibrocystic changes  - focal atypical lobular hyperplasia  --concordant   2. Left  Breast  - benign breast tissue --discordant  As noted, the right breast biopsy was found to be concordant, but the left breast biopsy was discordant. She proceeded to bilateral lumpectomies on 02/12/2020 under Dr. Carolynne Edouard. Pathology from the procedure ([id number]) showed: 1. Right Breast  - fibrocystic change  - columnar cell change  - usual ductal hyperplasia  - intraductal papilloma  - associated microcalcifications 2. 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 Proliferation marker Ki67 at 2%. HER2 equivocal by immunohistochemistry (2+), but negative by fluorescent in situ hybridization with a signals ratio 1.19 and number per cell 1.73.  She underwent re-excision of the positive margin, as well as left sentinel lymph node biopsy, on 03/04/2020. Pathology [id number]) showed: no residual carcinoma; benign lymph nodes (0/3).   Cancer Staging  Breast cancer in female Cheyenne Garner) Staging form: Breast, AJCC 8th Edition - Pathologic stage from 03/09/2020: Stage IA (pT1a, pN0, cM0, G2, ER+, PR+, HER2-) - Signed by Lowella Dell, MD on 03/16/2020 Stage prefix: Initial diagnosis Multigene prognostic tests performed: None Histologic grading system: 3 grade system  The patient's subsequent history is as detailed below.   PAST MEDICAL HISTORY: Past Medical History:  Diagnosis Date   Allergies    Anxiety    Asthma    cough variant asthma developed in last couple of years   Bipolar 1 disorder (HCC)    Breast cancer (HCC) 02/12/2020   left lumpectomy IDC w/radiation no chemo   Cervical cancer screening 09/12/2016   Colon polyp 09/12/2016   Family history of bladder cancer    Family history of breast cancer    Family history of  ovarian cancer    Family history of prostate cancer    Hx of colonic polyp 09/12/2016   Colonoscopy 2017 with 1 small polyp, done by Dr Dulce Sellar, repeat colonoscopy in 5 years, 2022   Hypothyroidism    Personal history of radiation therapy    Preventative health care 07/23/2015   Thyroid disease     PAST SURGICAL HISTORY: Past Surgical History:  Procedure Laterality Date   APPENDECTOMY     BREAST LUMPECTOMY WITH RADIOACTIVE SEED LOCALIZATION Bilateral 02/12/2020   Procedure: BILATERAL BREAST LUMPECTOMY WITH RADIOACTIVE SEED LOCALIZATION;  Surgeon: Griselda Miner, MD;  Location: [facility];  Service: General;  Laterality: Bilateral;   RE-EXCISION OF BREAST LUMPECTOMY Left 03/04/2020   Procedure: LEFT BREAST MARGIN REEXCISION;  Surgeon: Griselda Miner, MD;  Location: [facility];  Service: General;  Laterality: Left;   SENTINEL NODE BIOPSY Left 03/04/2020   Procedure: SENTINEL NODE BIOPSY;  Surgeon: Griselda Miner, MD;  [facility];  Service: General;  Laterality: Left;   SKIN BIOPSY     back moles removed (precancer?)    FAMILY HISTORY: Family History  Problem Relation Age of Onset   Arthritis Mother    Dementia Mother    Hypertension Father    Heart disease Father        MI, s/p triple bypass at age 32   Neuropathy Father        toxic peripheral  Cancer Paternal Grandfather        young, lung cancer?, heavy smoker   Cholelithiasis Sister    Aortic aneurysm Sister 84   Aneurysm Sister        descending aortic    Mental retardation Sister        ADD, schizoaffective d/o   Obesity Sister    Arthritis Brother        back disease   Ovarian cancer Cousin 82       maternal second cousin (MGF's great-niece)   Dementia Maternal Grandmother    Osteoarthritis Maternal Grandmother    Macular degeneration Maternal Grandfather    Osteoarthritis Maternal Grandfather    Congestive Heart Failure Paternal Grandmother    Breast cancer  Cousin 60       paternal first cousin   Prostate cancer Paternal Uncle        spread to bladder, d. 83s/70s   Cancer Niece 11       soft tissue sarcoma  From the genetic counselors note (February 2022):  "Ms. Mcfarlane's mother is alive at age 7 and may have had basal cell carcinoma of her face (unconfirmed). There were three maternal aunts. There is no known cancer among maternal aunts/uncles or maternal first cousins. Ms. Saffran maternal grandmother died at age 69 without cancer. Her maternal grandfather died at age 32 without cancer. A maternal second cousin (MGF's great-niece) died from ovarian cancer at age 49. A maternal first cousin once removed (MGF's niece) had an unknown type of gynecologic cancer in her 43s.    Ms. Appelbaum father is alive at age 63 and was diagnosed with bladder cancer at age 26. There are two paternal aunts and four paternal uncles. Once uncle died in his late 45s or early 84s with prostate cancer and bladder cancer. One paternal first cousin was diagnosed with breast cancer around age 6. Ms. Gadea paternal grandmother died in her 3s without cancer. Her paternal grandfather died in his 75s from an unknown type of cancer (not colon cancer, possibly lung cancer). A paternal first cousin once removed (PGF's nephew) had lung cancer in his 51s, and died in his 49s with prostate cancer and bladder cancer.  "   GYNECOLOGIC HISTORY:  No LMP recorded. Patient is postmenopausal. Menarche: 55 years old Age at first live birth: 55 years old [place] P 2 LMP age 89 Contraceptive  HRT yes at least 4 years  Hysterectomy? no BSO? 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  Her husband Cheyenne Garner is an Chief Financial Officer.  He takes care of our blood analyzer here and throughout the system.  Son Cheyenne Garner studied forestry and works for [employer] restoration taking care of their trails.  Son Cheyenne Garner is 64   ADVANCED DIRECTIVES: In the  absence of any documents to the contrary the patient's husband is her healthcare power of attorney   HEALTH MAINTENANCE: Social History   Tobacco Use   Smoking status: Never   Smokeless tobacco: Never  Substance Use Topics   Alcohol use: Yes    Comment: social   Drug use: No     Colonoscopy: 02/2015, Dr. [name], recall 2022  PAP: 08/2016, negative  Bone density: Never done   Allergies  Allergen Reactions   Gentamycin [Gentamicin]     Reacted to eye ointment, swelling red painful eyes    Current Outpatient Medications  Medication Sig Dispense Refill   tamoxifen (NOLVADEX) 20  MG tablet Take 1 tablet (20 mg total) by mouth daily. 90 tablet 4   albuterol (VENTOLIN HFA) 108 (90 Base) MCG/ACT inhaler Inhale 2 puffs into the lungs every 6 (six) hours as needed for wheezing or shortness of breath. 1 Inhaler 2   Cholecalciferol (VITAMIN D-3) 125 MCG (5000 UT) TABS Take 1 tablet by mouth daily at 6 (six) AM.     COVID-19 mRNA bivalent vaccine, Pfizer, (PFIZER COVID-19 VAC BIVALENT) injection Inject into the muscle. 0.3 mL 0   lithium 300 MG tablet Take 300 mg by mouth 2 (two) times daily.     Probiotic Product (PROBIOTIC-10 PO) Take 1 capsule by mouth daily.     thyroid (NP THYROID) 90 MG tablet Take 90 mg by mouth daily.     Zinc 50 MG TABS Take 50 mg by mouth daily.     No current facility-administered medications for this visit.    OBJECTIVE: White woman who appears well  Vitals:   01/05/21 1325  BP: 117/79  Pulse: 70  Resp: 16  Temp: 98.1 F (36.7 C)  SpO2: 98%      Body mass index is 23.18 kg/m.   Wt Readings from Last 3 Encounters:  01/05/21 143 lb 9.6 oz (65.1 kg)  11/08/20 137 lb 3.2 oz (62.2 kg)  08/18/20 136 lb (61.7 kg)     ECOG FS:1 - Symptomatic but completely ambulatory  Sclerae unicteric, EOMs intact Wearing a mask No cervical or supraclavicular adenopathy Lungs no rales or rhonchi Heart regular rate and rhythm Abd soft, nontender, positive bowel  sounds MSK no focal spinal tenderness, no upper extremity lymphedema Neuro: nonfocal, well oriented, appropriate affect Breasts: The right breast is unremarkable.  The left breast is status postlumpectomy and radiation.  The cosmetic result is good.  There is no evidence of disease recurrence.  Both axillae are benign.   LAB RESULTS:  CMP     Component Value Date/Time   NA 141 01/05/2021 1320   NA 143 04/21/2020 0843   K 4.0 01/05/2021 1320   CL 106 01/05/2021 1320   CO2 30 01/05/2021 1320   GLUCOSE 101 (H) 01/05/2021 1320   BUN 24 (H) 01/05/2021 1320   BUN 13 04/21/2020 0843   CREATININE 0.98 01/05/2021 1320   CALCIUM 9.7 01/05/2021 1320   PROT 6.9 01/05/2021 1320   ALBUMIN 4.2 01/05/2021 1320   AST 14 (L) 01/05/2021 1320   ALT 12 01/05/2021 1320   ALKPHOS 74 01/05/2021 1320   BILITOT 0.6 01/05/2021 1320   GFRNONAA >60 01/05/2021 1320   GFRAA >60 06/25/2014 1948    No results found for: TOTALPROTELP, ALBUMINELP, A1GS, A2GS, BETS, BETA2SER, GAMS, MSPIKE, SPEI  Lab Results  Component Value Date   WBC 5.6 01/05/2021   NEUTROABS 3.2 01/05/2021   HGB 13.2 01/05/2021   HCT 40.9 01/05/2021   MCV 91.7 01/05/2021   PLT 159 01/05/2021    No results found for: LABCA2  No components found for: RAQTMA263  No results for input(s): INR in the last 168 hours.  No results found for: LABCA2  No results found for: FHL456  No results found for: YBW389  No results found for: HTD428  No results found for: CA2729  No components found for: HGQUANT  No results found for: CEA1 / No results found for: CEA1   No results found for: AFPTUMOR  No results found for: CHROMOGRNA  No results found for: KPAFRELGTCHN, LAMBDASER, KAPLAMBRATIO (kappa/lambda light chains)  No results found for: HGBA,  HGBA2QUANT, HGBFQUANT, HGBSQUAN (Hemoglobinopathy evaluation)   No results found for: LDH  No results found for: IRON, TIBC, IRONPCTSAT (Iron and TIBC)  No results found for:  FERRITIN  Urinalysis    Component Value Date/Time   COLORURINE YELLOW 06/25/2014 [place] 06/25/2014 1850   LABSPEC 1.029 06/25/2014 1850   PHURINE 5.5 06/25/2014 1850   GLUCOSEU NEGATIVE 06/25/2014 1850   HGBUR TRACE (A) 06/25/2014 1850   BILIRUBINUR NEGATIVE 06/25/2014 1850   KETONESUR NEGATIVE 06/25/2014 1850   PROTEINUR NEGATIVE 06/25/2014 1850   UROBILINOGEN 0.2 06/25/2014 1850   NITRITE NEGATIVE 06/25/2014 1850   LEUKOCYTESUR SMALL (A) 06/25/2014 1850    STUDIES: No results found.   ELIGIBLE FOR AVAILABLE RESEARCH PROTOCOL: no  ASSESSMENT: 55 y.o. [facility] woman status post bilateral lumpectomies 02/12/2020, showing  (a) in the right breast, an intraductal papilloma and atypical lobular hyperplasia  (b) in the left breast, a pT1a pNX, stage IA invasive ductal carcinoma, grade 2, with positive margins   (i) the invasive disease was estrogen and progesterone receptor positive, HER-2 not amplified, with an MIB-1 of 2%   (ii) additional surgery 03/04/2020 clear margins   (iii) left axillary sentinel lymph node sampling 03/04/2020 removed 2 lymph nodes both negative  (1) adjuvant radiation completed 04/30/2020  (2) genetics testing 04/02/2020 through the [facility]RNAinsight Panel found no deleterious mutations in AIP, ALK, APC, ATM, AXIN2, BAP1, BARD1, BLM, BMPR1A, BRCA1, BRCA2, BRIP1, CDC73, CDH1, CDK4, CDKN1B, CDKN2A, CHEK2, CTNNA1, DICER1, FANCC, FH, FLCN, GALNT12, KIF1B, LZTR1, MAX, MEN1, MET, MLH1, MSH2, MSH3, MSH6, MUTYH, NBN, NF1, NF2, NTHL1, PALB2, PHOX2B, PMS2, POT1, PRKAR1A, PTCH1, PTEN, RAD51C, RAD51D, RB1, RECQL, RET, SDHA, SDHAF2, SDHB, SDHC, SDHD, SMAD4, SMARCA4, SMARCB1, SMARCE1, STK11, SUFU, TMEM127, TP53, TSC1, TSC2, VHL and XRCC2 (sequencing and deletion/duplication); EGFR, EGLN1, HOXB13, KIT, MITF, PDGFRA, POLD1, and POLE (sequencing only); EPCAM and GREM1 (deletion/duplication only).   (3) to start tamoxifen  01/16/2021   PLAN: Cheyenne Garner is coming up on a year from definitive surgery for her breast cancer.  There is no evidence of disease activity.  This is very favorable.  She is rethinking the whole issue of tamoxifen and today we discussed extensively the mechanism of action and the possible toxicities side effects and complications of this agent.  She would like to give it a try and the start target date is 01/16/2021.  Urged her to call us if she has side effects of concern because we can treat many of them and if that fails we can drop the dose.  There is now some data for using lower doses and low risk cases like hers.  She has dense breasts and very is very concerned about the possibility of a new breast cancer developing since hers was not found by mammogram initially.  I have set her up for a breast MRI in April after discussing the various types of MRI and some concerns regarding cost issues.  She will let us know if there is any concern there.  Otherwise she will see [place] in April and then will see Dr. Marlou Starks in October after her mammography in October  She knows to call for any other issue that may develop before then.  Total encounter time 35 minutes.Sarajane Jews C. Melora Menon, MD 01/05/2021 2:15 PM Medical Oncology and Hematology [facility] [place], [place] [address] Tel. [phone]    Fax. [phone]   This document serves as a record of services personally  performed by Lurline Del, MD. It was created on his behalf by Wilburn Mylar, a trained medical scribe. The creation of this record is based on the scribe's personal observations and the provider's statements to them.   I, Lurline Del MD, have reviewed the above documentation for accuracy and completeness, and I agree with the above.   *Total Encounter Time as defined by the Centers for Medicare and Medicaid Services includes, in addition to the face-to-face time of a patient visit  (documented in the note above) non-face-to-face time: obtaining and reviewing outside history, ordering and reviewing medications, tests or procedures, care coordination (communications with other health care professionals or caregivers) and documentation in the medical record.

## 2021-01-05 ENCOUNTER — Other Ambulatory Visit: Payer: Self-pay

## 2021-01-05 ENCOUNTER — Inpatient Hospital Stay: Payer: BC Managed Care – PPO

## 2021-01-05 ENCOUNTER — Inpatient Hospital Stay: Payer: BC Managed Care – PPO | Attending: Oncology | Admitting: Oncology

## 2021-01-05 VITALS — BP 117/79 | HR 70 | Temp 98.1°F | Resp 16 | Ht 66.0 in | Wt 143.6 lb

## 2021-01-05 DIAGNOSIS — C50912 Malignant neoplasm of unspecified site of left female breast: Secondary | ICD-10-CM | POA: Diagnosis not present

## 2021-01-05 DIAGNOSIS — Z170 Estrogen receptor positive status [ER+]: Secondary | ICD-10-CM | POA: Diagnosis not present

## 2021-01-05 DIAGNOSIS — Z803 Family history of malignant neoplasm of breast: Secondary | ICD-10-CM | POA: Diagnosis not present

## 2021-01-05 DIAGNOSIS — Z8041 Family history of malignant neoplasm of ovary: Secondary | ICD-10-CM | POA: Insufficient documentation

## 2021-01-05 DIAGNOSIS — Z923 Personal history of irradiation: Secondary | ICD-10-CM | POA: Diagnosis not present

## 2021-01-05 DIAGNOSIS — C50812 Malignant neoplasm of overlapping sites of left female breast: Secondary | ICD-10-CM

## 2021-01-05 DIAGNOSIS — Z8042 Family history of malignant neoplasm of prostate: Secondary | ICD-10-CM | POA: Insufficient documentation

## 2021-01-05 DIAGNOSIS — N6041 Mammary duct ectasia of right breast: Secondary | ICD-10-CM | POA: Diagnosis not present

## 2021-01-05 DIAGNOSIS — Z8052 Family history of malignant neoplasm of bladder: Secondary | ICD-10-CM | POA: Insufficient documentation

## 2021-01-05 LAB — CMP (CANCER CENTER ONLY)
ALT: 12 U/L (ref 0–44)
AST: 14 U/L — ABNORMAL LOW (ref 15–41)
Albumin: 4.2 g/dL (ref 3.5–5.0)
Alkaline Phosphatase: 74 U/L (ref 38–126)
Anion gap: 5 (ref 5–15)
BUN: 24 mg/dL — ABNORMAL HIGH (ref 6–20)
CO2: 30 mmol/L (ref 22–32)
Calcium: 9.7 mg/dL (ref 8.9–10.3)
Chloride: 106 mmol/L (ref 98–111)
Creatinine: 0.98 mg/dL (ref 0.44–1.00)
GFR, Estimated: >60 mL/min (ref >60)
Glucose, Bld: 101 mg/dL — ABNORMAL HIGH (ref 70–99)
Potassium: 4 mmol/L (ref 3.5–5.1)
Sodium: 141 mmol/L (ref 135–145)
Total Bilirubin: 0.6 mg/dL (ref 0.3–1.2)
Total Protein: 6.9 g/dL (ref 6.5–8.1)

## 2021-01-05 LAB — CBC WITH DIFFERENTIAL (CANCER CENTER ONLY)
Abs Immature Granulocytes: 0.01 10*3/uL (ref 0.00–0.07)
Basophils Absolute: 0 10*3/uL (ref 0.0–0.1)
Basophils Relative: 1 %
Eosinophils Absolute: 0.1 10*3/uL (ref 0.0–0.5)
Eosinophils Relative: 2 %
HCT: 40.9 % (ref 36.0–46.0)
Hemoglobin: 13.2 g/dL (ref 12.0–15.0)
Immature Granulocytes: 0 %
Lymphocytes Relative: 32 %
Lymphs Abs: 1.8 10*3/uL (ref 0.7–4.0)
MCH: 29.6 pg (ref 26.0–34.0)
MCHC: 32.3 g/dL (ref 30.0–36.0)
MCV: 91.7 fL (ref 80.0–100.0)
Monocytes Absolute: 0.4 10*3/uL (ref 0.1–1.0)
Monocytes Relative: 8 %
Neutro Abs: 3.2 10*3/uL (ref 1.7–7.7)
Neutrophils Relative %: 57 %
Platelet Count: 159 10*3/uL (ref 150–400)
RBC: 4.46 MIL/uL (ref 3.87–5.11)
RDW: 11.9 % (ref 11.5–15.5)
WBC Count: 5.6 10*3/uL (ref 4.0–10.5)
nRBC: 0 % (ref 0.0–0.2)

## 2021-01-05 MED ORDER — TAMOXIFEN CITRATE 20 MG PO TABS: 20.0000 mg | ORAL_TABLET | Freq: Every day | ORAL | 4 refills | Status: AC

## 2021-01-14 DIAGNOSIS — Z9011 Acquired absence of right breast and nipple: Secondary | ICD-10-CM | POA: Diagnosis not present

## 2021-01-14 DIAGNOSIS — C50912 Malignant neoplasm of unspecified site of left female breast: Secondary | ICD-10-CM | POA: Diagnosis not present

## 2021-02-07 ENCOUNTER — Ambulatory Visit: Payer: BC Managed Care – PPO | Attending: General Surgery

## 2021-02-07 ENCOUNTER — Other Ambulatory Visit: Payer: Self-pay

## 2021-02-07 VITALS — Wt 148.4 lb

## 2021-02-07 DIAGNOSIS — Z483 Aftercare following surgery for neoplasm: Secondary | ICD-10-CM | POA: Insufficient documentation

## 2021-02-07 NOTE — Therapy (Signed)
[facility] [place], [place], [address] Phone: [phone]   Fax:  [phone]  Physical Therapy Treatment  Patient Details  Name: Cheyenne [facility] MRN: [id number] Date of Birth: 1965/04/21 Referring Provider (PT): Shona Simpson   Encounter Date: 02/07/2021   PT End of Session - 02/07/21 1508     Visit Number 14   # unchanged due to screen only   PT Start Time 3818    PT Stop Time 4037    PT Time Calculation (min) 5 min    Activity Tolerance Patient tolerated treatment well    Behavior During Therapy [facility] for tasks assessed/performed             Past Medical History:  Diagnosis Date   Allergies    Anxiety    Asthma    cough variant asthma developed in last couple of years   Bipolar 1 disorder ([place])    Breast cancer ([place]) 02/12/2020   left lumpectomy IDC w/radiation no chemo   Cervical cancer screening 09/12/2016   Colon polyp 09/12/2016   Family history of bladder cancer    Family history of breast cancer    Family history of ovarian cancer    Family history of prostate cancer    Hx of colonic polyp 09/12/2016   Colonoscopy 2017 with 1 small polyp, done by Dr [name], repeat colonoscopy in 5 years, 2022   Hypothyroidism    Personal history of radiation therapy    Preventative health care 07/23/2015   Thyroid disease     Past Surgical History:  Procedure Laterality Date   APPENDECTOMY     BREAST LUMPECTOMY WITH RADIOACTIVE SEED LOCALIZATION Bilateral 02/12/2020   Procedure: BILATERAL BREAST LUMPECTOMY WITH RADIOACTIVE SEED LOCALIZATION;  Surgeon: Jovita Kussmaul, MD;  Location: [facility];  Service: General;  Laterality: Bilateral;   RE-EXCISION OF BREAST LUMPECTOMY Left 03/04/2020   Procedure: LEFT BREAST MARGIN REEXCISION;  Surgeon: Jovita Kussmaul, MD;  Location: [facility];  Service: General;  Laterality: Left;   SENTINEL NODE BIOPSY Left 03/04/2020   Procedure:  SENTINEL NODE BIOPSY;  Surgeon: Jovita Kussmaul, MD;  Location: [facility];  Service: General;  Laterality: Left;   SKIN BIOPSY     back moles removed (precancer?)    Vitals:   02/07/21 1509  Weight: 148 lb 6 oz (67.3 kg)     Subjective Assessment - 02/07/21 1508     Subjective Pt returns for her 3 month L-Dex screen.    Pertinent History L breast cancer, s/p bilateral breast lumpectomies 02/12/20, R was benign and L was discovered to have DCIS and IDC ER/PR+, HER 2-, Ki67- 2%, pt will under reexcision and SLNB on L on 03/04/20- unknown if pt will require chemo and radiation                    L-DEX FLOWSHEETS - 02/07/21 1500       L-DEX LYMPHEDEMA SCREENING   Measurement Type Unilateral    L-DEX MEASUREMENT EXTREMITY Upper Extremity    POSITION  Standing    DOMINANT SIDE Left    At Risk Side Left    BASELINE SCORE (UNILATERAL) -1.7                                     PT Long Term Goals - 11/08/20 1410  PT LONG TERM GOAL #1   Title Pt will be independent in self MLD for long term management of lymphedema.    Baseline 08/30/20- pt is independent    Time 4    Period Weeks    Status Achieved      PT LONG TERM GOAL #2   Title Pt will obtain appropriate compression bra for long term management of left breast lymphedema.    Baseline 08/30/20- pt has received a compression bra    Period Weeks    Status Achieved      PT LONG TERM GOAL #3   Title Pt will report a 50% decrease in swelling in L breast to decrease risk of infection.    Baseline 08/30/20- 75% decrease    Time 4    Period Weeks    Status Achieved      PT LONG TERM GOAL #4   Title Pt will report no tightness across L chest with end range L shoulder ROM to allow improved comfort.    Baseline 08/30/20- pt is continuing to have this; 09/28/20- pt is not only having tightness in L axilla    Time 4    Period Weeks    Status Achieved      PT LONG TERM GOAL #5    Title Pt will report no soreness to palpation or tenderness in L axilla while pt returns to the gym to allow her to return to PLOF.    Baseline 11/08/20- pt reports she has some soreness but that it has been improving over the last few weeks    Time 4    Period Weeks    Status Partially Met                   Plan - 02/07/21 1510     Clinical Impression Statement Pt returns for her 3 month L-Dex screen. Her change from baseline of is WNLs so no further treatment is required at this time except to cont every 3 month L-Dex screens which pt is agreeable to.    PT Next Visit Plan Continue SOZO every 3 months for up to 2 years from her SLNB (~02/11/2022)    Consulted and Agree with Plan of Care Patient             Patient will benefit from skilled therapeutic intervention in order to improve the following deficits and impairments:     Visit Diagnosis: Aftercare following surgery for neoplasm     Problem List Patient Active Problem List   Diagnosis Date Noted   Hyperglycemia 11/08/2020   Elevated sed rate 11/08/2020   Left upper lobe pneumonia 08/15/2020   [place] 07/14/2020   Malignant neoplasm of overlapping sites of left breast in female, estrogen receptor positive ([place]) 05/18/2020   Genetic testing 04/07/2020   Hypothyroidism    Anxiety    Allergies    FH: thoracic aortic aneurysm 03/23/2020   Family history of breast cancer    Family history of ovarian cancer    Family history of bladder cancer    Family history of prostate cancer    Breast cancer in female ([place]) 03/09/2020   Bilateral dry eyes 01/27/2019   Cervical cancer screening 09/12/2016   Hx of colonic polyp 09/12/2016   Colon polyp 09/12/2016   Preventative health care 07/23/2015   Asthma    Thyroid disease    Bipolar affective ([place])    Urinary incontinence 12/25/2011    Otelia Limes, PTA 02/07/2021, 3:12  PM  [facility] [place]  [place], [place], [address] Phone: [phone]   Fax:  [phone]  Name: Cheyenne [facility] MRN: [id number] Date of Birth: 1965/09/16

## 2021-03-21 ENCOUNTER — Other Ambulatory Visit: Payer: Self-pay | Admitting: Oncology

## 2021-03-21 ENCOUNTER — Other Ambulatory Visit: Payer: Self-pay | Admitting: Hematology and Oncology

## 2021-03-21 DIAGNOSIS — C50812 Malignant neoplasm of overlapping sites of left female breast: Secondary | ICD-10-CM

## 2021-03-21 DIAGNOSIS — Z170 Estrogen receptor positive status [ER+]: Secondary | ICD-10-CM

## 2021-03-24 ENCOUNTER — Encounter: Payer: Self-pay | Admitting: Family Medicine

## 2021-03-24 ENCOUNTER — Ambulatory Visit (INDEPENDENT_AMBULATORY_CARE_PROVIDER_SITE_OTHER): Payer: BC Managed Care – PPO | Admitting: Family Medicine

## 2021-03-24 ENCOUNTER — Encounter: Payer: BC Managed Care – PPO | Admitting: Family Medicine

## 2021-03-24 VITALS — BP 101/60 | HR 79 | Ht 66.0 in | Wt 141.4 lb

## 2021-03-24 DIAGNOSIS — Z0000 Encounter for general adult medical examination without abnormal findings: Secondary | ICD-10-CM

## 2021-03-24 DIAGNOSIS — Z114 Encounter for screening for human immunodeficiency virus [HIV]: Secondary | ICD-10-CM | POA: Diagnosis not present

## 2021-03-24 DIAGNOSIS — Z1159 Encounter for screening for other viral diseases: Secondary | ICD-10-CM | POA: Diagnosis not present

## 2021-03-24 DIAGNOSIS — E039 Hypothyroidism, unspecified: Secondary | ICD-10-CM | POA: Diagnosis not present

## 2021-03-24 LAB — CBC
HCT: 43.2 % (ref 36.0–46.0)
Hemoglobin: 14.2 g/dL (ref 12.0–15.0)
MCHC: 32.9 g/dL (ref 30.0–36.0)
MCV: 91.4 fl (ref 78.0–100.0)
Platelets: 138 10*3/uL — ABNORMAL LOW (ref 150.0–400.0)
RBC: 4.73 Mil/uL (ref 3.87–5.11)
RDW: 13 % (ref 11.5–15.5)
WBC: 7.6 10*3/uL (ref 4.0–10.5)

## 2021-03-24 LAB — T4, FREE: Free T4: 0.75 ng/dL (ref 0.60–1.60)

## 2021-03-24 LAB — COMPREHENSIVE METABOLIC PANEL
ALT: 10 U/L (ref 0–35)
AST: 15 U/L (ref 0–37)
Albumin: 4.2 g/dL (ref 3.5–5.2)
Alkaline Phosphatase: 69 U/L (ref 39–117)
BUN: 18 mg/dL (ref 6–23)
CO2: 30 mEq/L (ref 19–32)
Calcium: 9.6 mg/dL (ref 8.4–10.5)
Chloride: 104 mEq/L (ref 96–112)
Creatinine, Ser: 0.89 mg/dL (ref 0.40–1.20)
GFR: 72.65 mL/min (ref >60.00)
Glucose, Bld: 84 mg/dL (ref 70–99)
Potassium: 4.1 mEq/L (ref 3.5–5.1)
Sodium: 139 mEq/L (ref 135–145)
Total Bilirubin: 0.8 mg/dL (ref 0.2–1.2)
Total Protein: 6.5 g/dL (ref 6.0–8.3)

## 2021-03-24 LAB — LIPID PANEL
Cholesterol: 163 mg/dL (ref 0–200)
HDL: 71.7 mg/dL (ref >39.00)
LDL Cholesterol: 76 mg/dL (ref 0–99)
NonHDL: 91.03
Total CHOL/HDL Ratio: 2
Triglycerides: 73 mg/dL (ref 0.0–149.0)
VLDL: 14.6 mg/dL (ref 0.0–40.0)

## 2021-03-24 LAB — TSH: TSH: 1.5 u[IU]/mL (ref 0.35–5.50)

## 2021-03-24 NOTE — Progress Notes (Signed)
BP 101/60    Pulse 79    Ht '5\' 6"'$  (1.676 m)    Wt 141 lb 6.4 oz (64.1 kg)    BMI 22.82 kg/m    Subjective:    Patient ID: Cheyenne Garner, female    DOB: 1965-07-15, 56 y.o.   MRN: [id number]  HPI: Cheyenne Garner is a 56 y.o. female presenting on 03/24/2021 for comprehensive medical examination. Current medical complaints include: none. Scheduled to meet with new oncologist for breast cancer in April.   Currently tolerating her meds well, no complaints or concerns. Getting over allergies/cough from cleaning out/sanding. Still has a mild cough and wheeze, but doing much better, denies any intervention.  She currently lives with: husband  Interim Problems from her last visit: no   She reports regular vision exams q1-5y: yes She reports regular dental exams q 9m yes Her diet consists of: minimal dairy, meat only 2x/week, lots of fish/chicken, fruits/veggies She endorses exercise and/or activity of: gym at least 3-4x/day, strength training, kickboxing  She works at: nMarine scientistat [employer]  She denies ETOH use. She denies nictoine use. She denies illegal substance use.   She reports menopause around age [age]Current menopausal symptoms: no She is currently  sexually active  She denies  concerns today about STI Contraception choices are: n/a  She denies concerns about skin changes today. She denies concerns about bowel changes today. She denies concerns about bladder changes today.  Depression Screen done today and results listed below:  [facility] 03/24/2021 11/06/2017 09/12/2016  Decreased Interest 0 0 0  Down, Depressed, Hopeless 0 0 0  PHQ - 2 Score 0 0 0  Altered sleeping - - 0  Tired, decreased energy - - 0  Change in appetite - - 0  Feeling bad or failure about yourself  - - 0  Trouble concentrating - - 0  Moving slowly or fidgety/restless - - 0  Suicidal thoughts - - 0  PHQ-9 Score - - 0    She does not have a history of falls.        Past Medical  History:  Past Medical History:  Diagnosis Date   Allergies    Anxiety    Asthma    cough variant asthma developed in last couple of years   [place] 1 disorder [place]    [place]   left lumpectomy IDC w/radiation no chemo   Cervical cancer screening 09/12/2016   Colon polyp 09/12/2016   Family history of bladder cancer    Family history of breast cancer    Family history of ovarian cancer    Family history of prostate cancer    Hx of colonic polyp 09/12/2016   Colonoscopy 2017 with 1 small polyp, done by Dr OPaulita Fujita repeat colonoscopy in 5 years, 2022   Hypothyroidism    Personal history of radiation therapy    Preventative health care 07/23/2015   Thyroid disease     Surgical History:  Past Surgical History:  Procedure Laterality Date   APPENDECTOMY     BREAST LUMPECTOMY WITH RADIOACTIVE SEED LOCALIZATION Bilateral 02/12/2020   Procedure: BILATERAL BREAST LUMPECTOMY WITH RADIOACTIVE SEED LOCALIZATION;  Surgeon: TJovita Kussmaul MD;  [place]  Service: General;  Laterality: Bilateral;   RE-EXCISION OF BREAST LUMPECTOMY Left 03/04/2020   Procedure: LEFT BREAST MARGIN REEXCISION;  Surgeon: TJovita Kussmaul MD;  [place]  Service: General;  Laterality: Left;  SENTINEL NODE BIOPSY Left 03/04/2020   Procedure: SENTINEL NODE BIOPSY;  Surgeon: [name], MD;  Location: [place];  Service: General;  Laterality: Left;   SKIN BIOPSY     back moles removed (precancer?)    Medications:  Current Outpatient Medications on File Prior to Visit  Medication Sig   albuterol (VENTOLIN HFA) 108 (90 Base) MCG/ACT inhaler Inhale 2 puffs into the lungs every 6 (six) hours as needed for wheezing or shortness of breath.   Cholecalciferol (VITAMIN D-3) 125 MCG (5000 UT) TABS Take 1 tablet by mouth daily at 6 (six) AM.   lithium 300 MG tablet Take 300 mg by mouth 2 (two) times daily.   Probiotic Product  (PROBIOTIC-10 PO) Take 1 capsule by mouth daily.   thyroid (NP THYROID) 90 MG tablet Take 90 mg by mouth daily.   Zinc 50 MG TABS Take 50 mg by mouth daily.   No current facility-administered medications on file prior to visit.    Allergies:  Allergies  Allergen Reactions   Gentamycin [Gentamicin]     Reacted to eye ointment, swelling red painful eyes    Social History:  Social History   Socioeconomic History   Marital status: Married    Spouse name: Not on file   Number of children: Not on file   Years of education: Not on file   Highest education level: Not on file  Occupational History   Not on file  Tobacco Use   Smoking status: Never   Smokeless tobacco: Never  Substance and Sexual Activity   Alcohol use: Yes    Comment: social   Drug use: No   Sexual activity: Yes    Birth control/protection: None, Post-menopausal  Other Topics Concern   Not on file  Social History Narrative   Lives with husband, 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 No major dietary restrictions   Social Determinants of Radio broadcast assistant Strain: Not on file  Food Insecurity: Not on file  Transportation Needs: Not on file  Physical Activity: Not on file  Stress: Not on file  Social Connections: Not on file  Intimate Partner Violence: Not on file   Social History   Tobacco Use  Smoking Status Never  Smokeless Tobacco Never   Social History   Substance and Sexual Activity  Alcohol Use Yes   Comment: social    Family History:  Family History  Problem Relation Age of Onset   Arthritis Mother    Dementia Mother    Hypertension Father    Heart disease Father        MI, s/p triple bypass at age 66   Neuropathy Father        toxic peripheral    Cancer Paternal Grandfather        young, lung cancer?, heavy smoker   Cholelithiasis Sister    Aortic aneurysm Sister 92   Aneurysm Sister        descending aortic    Mental retardation Sister        ADD,  schizoaffective d/o   Obesity Sister    Arthritis Brother        back disease   Ovarian cancer Cousin 12       maternal second cousin (MGF's great-niece)   Dementia Maternal Grandmother    Osteoarthritis Maternal Grandmother    Macular degeneration Maternal Grandfather    Osteoarthritis Maternal Grandfather    Congestive Heart Failure Paternal Grandmother  Breast cancer Cousin 39       paternal first cousin   Prostate cancer Paternal Uncle        spread to bladder, d. 58s/70s   Cancer Niece 11       soft tissue sarcoma    Past medical history, surgical history, medications, allergies, family history and social history reviewed with patient today and changes made to appropriate areas of the chart.   All ROS negative except what is listed above and in the HPI.      Objective:    BP 101/60    Pulse 79    Ht '5\' 6"'$  (1.676 m)    Wt 141 lb 6.4 oz (64.1 kg)    BMI 22.82 kg/m   Wt Readings from Last 3 Encounters:  03/24/21 141 lb 6.4 oz (64.1 kg)  02/07/21 148 lb 6 oz (67.3 kg)  01/05/21 143 lb 9.6 oz (65.1 kg)    Physical Exam Vitals reviewed.  Constitutional:      Appearance: Normal appearance. She is normal weight.  HENT:     Head: Normocephalic and atraumatic.     Right Ear: Tympanic membrane normal.     Left Ear: Tympanic membrane normal.     Nose: Nose normal.     Mouth/Throat:     Mouth: Mucous membranes are moist.     Pharynx: Oropharynx is clear.  Eyes:     Extraocular Movements: Extraocular movements intact.     Conjunctiva/sclera: Conjunctivae normal.     Pupils: Pupils are equal, round, and reactive to light.  Cardiovascular:     Rate and Rhythm: Normal rate and regular rhythm.     Pulses: Normal pulses.     Heart sounds: Normal heart sounds.  Pulmonary:     Effort: Pulmonary effort is normal.     Breath sounds: Normal breath sounds.     Comments: Minimal wheeze Abdominal:     General: Abdomen is flat. Bowel sounds are normal. There is no distension.      Palpations: Abdomen is soft. There is no mass.     Tenderness: There is no abdominal tenderness. There is no guarding or rebound.  Musculoskeletal:        General: Normal range of motion.     Cervical back: Normal range of motion and neck supple. No tenderness.  Lymphadenopathy:     Cervical: No cervical adenopathy.  Skin:    General: Skin is warm and dry.     Capillary Refill: Capillary refill takes less than 2 seconds.  Neurological:     General: No focal deficit present.     Mental Status: She is alert and oriented to person, place, and time. Mental status is at baseline.  Psychiatric:        Mood and Affect: Mood normal.        Behavior: Behavior normal.        Thought Content: Thought content normal.        Judgment: Judgment normal.    Results for orders placed or performed in visit on 01/05/21  CBC with Differential (Cancer Center Only)  Result Value Ref Range   WBC Count 5.6 4.0 - 10.5 K/uL   RBC 4.46 3.87 - 5.11 MIL/uL   Hemoglobin 13.2 12.0 - 15.0 g/dL   HCT 40.9 36.0 - 46.0 %   MCV 91.7 80.0 - 100.0 fL   MCH 29.6 26.0 - 34.0 pg   MCHC 32.3 30.0 - 36.0 g/dL   RDW 11.9 11.5 -  15.5 %   Platelet Count 159 150 - 400 K/uL   nRBC 0.0 0.0 - 0.2 %   Neutrophils Relative % 57 %   Neutro Abs 3.2 1.7 - 7.7 K/uL   Lymphocytes Relative 32 %   Lymphs Abs 1.8 0.7 - 4.0 K/uL   Monocytes Relative 8 %   Monocytes Absolute 0.4 0.1 - 1.0 K/uL   Eosinophils Relative 2 %   Eosinophils Absolute 0.1 0.0 - 0.5 K/uL   Basophils Relative 1 %   Basophils Absolute 0.0 0.0 - 0.1 K/uL   Immature Granulocytes 0 %   Abs Immature Granulocytes 0.01 0.00 - 0.07 K/uL  CMP (Cancer Center only)  Result Value Ref Range   Sodium 141 135 - 145 mmol/L   Potassium 4.0 3.5 - 5.1 mmol/L   Chloride 106 98 - 111 mmol/L   CO2 30 22 - 32 mmol/L   Glucose, Bld 101 (H) 70 - 99 mg/dL   BUN 24 (H) 6 - 20 mg/dL   Creatinine 0.98 0.44 - 1.00 mg/dL   Calcium 9.7 8.9 - 10.3 mg/dL   Total Protein 6.9 6.5 -  8.1 g/dL   Albumin 4.2 3.5 - 5.0 g/dL   AST 14 (L) 15 - 41 U/L   ALT 12 0 - 44 U/L   Alkaline Phosphatase 74 38 - 126 U/L   Total Bilirubin 0.6 0.3 - 1.2 mg/dL   GFR, Estimated >60 >60 mL/min   Anion gap 5 5 - 15      Assessment & Plan:   Problem List Items Addressed This Visit       Endocrine   Hypothyroidism - Primary   Relevant Orders   CBC   Comprehensive metabolic panel   Lipid panel   TSH   T4, free   Other Visit Diagnoses     Screening for HIV without presence of risk factors       Relevant Orders   HIV Antibody (routine testing w rflx)   Encounter for hepatitis C screening test for low risk patient       Relevant Orders   Hepatitis C antibody   Annual physical exam       Relevant Orders   CBC   Comprehensive metabolic panel   Lipid panel   TSH   HIV Antibody (routine testing w rflx)   Hepatitis C antibody          LABORATORY TESTING:  - Health maintenance labs ordered today as discussed above.           CBC, CMP, LIPIDS          TSH        - STI testing: deferred (USPSTF recommends Gonorrhea/Chlamydia screening (urine) in all sexually active females (or on OCP) age 29 and under; continue after age 7 if high risk) - Pap smear: up to date - HIV screening: today   - Hep C screening: today      IMMUNIZATIONS:   - Tdap: Tetanus vaccination status reviewed: last tetanus booster within 10 years. - Influenza: up to date - Pneumovax: Not applicable - Prevnar: Not applicable - HPV: Not applicable - Shingrix vaccine: Refused - COVID-19: Given elsewhere  SCREENING: - Mammogram: Up to date - Bone Density: Not applicable - Colonoscopy: Up to date  Discussed with patient purpose of the colonoscopy is to detect colon cancer at curable precancerous or early stages  - AAA Screening: Not applicable  -Hearing Test: Not applicable  -Spirometry: Not applicable  -  Lung Cancer Screening: Not applicable    PATIENT COUNSELING:   Advised to take 1 mg of  folate supplement per day if capable of pregnancy.   Sexuality: Discussed sexually transmitted diseases, partner selection, use of condoms, avoidance of unintended pregnancy, and contraceptive alternatives.   I discussed with the patient that most people either abstain from alcohol or drink within safe limits (<=14/week and <=4 drinks/occasion for males, <=7/weeks and <= 3 drinks/occasion for females) and that the risk for alcohol disorders and other health effects rises proportionally with the number of drinks per week and how often a drinker exceeds daily limits.  Discussed cessation/primary prevention of drug use and availability of treatment for abuse.   Diet: Encouraged to adjust caloric intake to maintain or achieve ideal body weight, to reduce intake of dietary saturated fat and total fat, to limit sodium intake by avoiding high sodium foods and not adding table salt, and to maintain adequate dietary potassium and calcium preferably from fresh fruits, vegetables, and low-fat dairy products. Encouraged vitamin D 1000 units and Calcium '1300mg'$  or 4 servings of dairy a day.  Emphasized the importance of regular exercise.  Injury prevention: Discussed safety belts, safety helmets, smoke detector, smoking near bedding or upholstery.   Dental health: Discussed importance of regular tooth brushing, flossing, and dental visits.  Follow up plan:  Return in about 6 months (around 09/24/2021) for routine f/u .  [facility] [employer] Olevia Bowens, DNP, FNP-C

## 2021-03-25 LAB — HIV ANTIBODY (ROUTINE TESTING W REFLEX): HIV 1&2 Ab, 4th Generation: NONREACTIVE

## 2021-03-25 LAB — HEPATITIS C ANTIBODY
Hepatitis C Ab: NONREACTIVE
SIGNAL TO CUT-OFF: 0.07 (ref <1.00)

## 2021-04-19 ENCOUNTER — Ambulatory Visit
Admission: RE | Admit: 2021-04-19 | Discharge: 2021-04-19 | Disposition: A | Discharge status: Home / Self Care | Payer: BC Managed Care – PPO | Source: Ambulatory Visit | Attending: Hematology and Oncology | Admitting: Hematology and Oncology

## 2021-04-19 DIAGNOSIS — Z1239 Encounter for other screening for malignant neoplasm of breast: Secondary | ICD-10-CM | POA: Diagnosis not present

## 2021-04-19 DIAGNOSIS — Z170 Estrogen receptor positive status [ER+]: Secondary | ICD-10-CM

## 2021-04-19 MED ORDER — GADOBUTROL 1 MMOL/ML IV SOLN
6.0000 mL | Freq: Once | INTRAVENOUS | Status: AC | PRN
  Administered 2021-04-19: 6 mL via INTRAVENOUS

## 2021-04-20 ENCOUNTER — Other Ambulatory Visit: Payer: BC Managed Care – PPO

## 2021-05-02 ENCOUNTER — Other Ambulatory Visit: Payer: Self-pay

## 2021-05-02 DIAGNOSIS — C50812 Malignant neoplasm of overlapping sites of left female breast: Secondary | ICD-10-CM

## 2021-05-03 ENCOUNTER — Other Ambulatory Visit: Payer: Self-pay

## 2021-05-03 ENCOUNTER — Encounter: Payer: Self-pay | Admitting: Hematology and Oncology

## 2021-05-03 ENCOUNTER — Inpatient Hospital Stay: Payer: BC Managed Care – PPO | Admitting: Hematology and Oncology

## 2021-05-03 ENCOUNTER — Inpatient Hospital Stay: Payer: BC Managed Care – PPO | Attending: Hematology and Oncology

## 2021-05-03 VITALS — BP 125/83 | HR 69 | Temp 97.6°F | Resp 16 | Ht 66.0 in | Wt 146.6 lb

## 2021-05-03 DIAGNOSIS — Z803 Family history of malignant neoplasm of breast: Secondary | ICD-10-CM | POA: Insufficient documentation

## 2021-05-03 DIAGNOSIS — Z170 Estrogen receptor positive status [ER+]: Secondary | ICD-10-CM | POA: Insufficient documentation

## 2021-05-03 DIAGNOSIS — Z923 Personal history of irradiation: Secondary | ICD-10-CM | POA: Insufficient documentation

## 2021-05-03 DIAGNOSIS — C50812 Malignant neoplasm of overlapping sites of left female breast: Secondary | ICD-10-CM

## 2021-05-03 DIAGNOSIS — C50912 Malignant neoplasm of unspecified site of left female breast: Secondary | ICD-10-CM | POA: Insufficient documentation

## 2021-05-03 DIAGNOSIS — Z8052 Family history of malignant neoplasm of bladder: Secondary | ICD-10-CM | POA: Insufficient documentation

## 2021-05-03 DIAGNOSIS — Z8042 Family history of malignant neoplasm of prostate: Secondary | ICD-10-CM | POA: Diagnosis not present

## 2021-05-03 DIAGNOSIS — Z801 Family history of malignant neoplasm of trachea, bronchus and lung: Secondary | ICD-10-CM | POA: Diagnosis not present

## 2021-05-03 LAB — CBC WITH DIFFERENTIAL (CANCER CENTER ONLY)
Abs Immature Granulocytes: 0.01 10*3/uL (ref 0.00–0.07)
Basophils Absolute: 0 10*3/uL (ref 0.0–0.1)
Basophils Relative: 1 %
Eosinophils Absolute: 0.2 10*3/uL (ref 0.0–0.5)
Eosinophils Relative: 4 %
HCT: 42.3 % (ref 36.0–46.0)
Hemoglobin: 13.8 g/dL (ref 12.0–15.0)
Immature Granulocytes: 0 %
Lymphocytes Relative: 23 %
Lymphs Abs: 1.3 10*3/uL (ref 0.7–4.0)
MCH: 29.6 pg (ref 26.0–34.0)
MCHC: 32.6 g/dL (ref 30.0–36.0)
MCV: 90.8 fL (ref 80.0–100.0)
Monocytes Absolute: 0.4 10*3/uL (ref 0.1–1.0)
Monocytes Relative: 7 %
Neutro Abs: 3.7 10*3/uL (ref 1.7–7.7)
Neutrophils Relative %: 65 %
Platelet Count: 153 10*3/uL (ref 150–400)
RBC: 4.66 MIL/uL (ref 3.87–5.11)
RDW: 12.1 % (ref 11.5–15.5)
WBC Count: 5.6 10*3/uL (ref 4.0–10.5)
nRBC: 0 % (ref 0.0–0.2)

## 2021-05-03 LAB — CMP (CANCER CENTER ONLY)
ALT: 13 U/L (ref 0–44)
AST: 15 U/L (ref 15–41)
Albumin: 4 g/dL (ref 3.5–5.0)
Alkaline Phosphatase: 73 U/L (ref 38–126)
Anion gap: 4 — ABNORMAL LOW (ref 5–15)
BUN: 27 mg/dL — ABNORMAL HIGH (ref 6–20)
CO2: 30 mmol/L (ref 22–32)
Calcium: 9.4 mg/dL (ref 8.9–10.3)
Chloride: 105 mmol/L (ref 98–111)
Creatinine: 0.9 mg/dL (ref 0.44–1.00)
GFR, Estimated: >60 mL/min (ref >60)
Glucose, Bld: 91 mg/dL (ref 70–99)
Potassium: 4.3 mmol/L (ref 3.5–5.1)
Sodium: 139 mmol/L (ref 135–145)
Total Bilirubin: 0.6 mg/dL (ref 0.3–1.2)
Total Protein: 7.3 g/dL (ref 6.5–8.1)

## 2021-05-03 MED ORDER — TAMOXIFEN CITRATE 10 MG PO TABS: 10.0000 mg | ORAL_TABLET | Freq: Every day | ORAL | 3 refills | Status: DC

## 2021-05-03 NOTE — Progress Notes (Signed)
?[facility]  ?[phone] [phone]  ? ? ? ?ID: Cheyenne Garner DOB: 1965/12/11  MR#: [id number]  DTY#:[id number] ? ?Patient Care Team: ?Bradd Canary, MD as PCP - General (Family Medicine) ?Thomasene Ripple, DO as PCP - Cardiology (Cardiology) ?Griselda Miner, MD as Consulting Physician (General Surgery) ?Magrinat, Valentino Hue, MD (Inactive) as Consulting Physician (Oncology) ?Dorothy Puffer, MD as Consulting Physician (Radiation Oncology) ?Willis Modena, MD as Consulting Physician (Gastroenterology) ?Haverstock, Elvin So, MD as Referring Physician (Dermatology) ?Donnelly Angelica, RN as Oncology Nurse Navigator ?Pershing Proud, RN as Oncology Nurse Navigator ?Huel Cote, MD as Consulting Physician (Obstetrics and Gynecology) ?Rachel Moulds, MD ?OTHER MD: ? ?CHIEF COMPLAINT: Estrogen receptor positive breast cancer ? ?CURRENT TREATMENT:  ?None ? ?INTERVAL HISTORY: ?Cheyenne Garner returns today for follow up of her estrogen receptor positive breast cancer. ?She received radiation therapy from 04/05/2020 through 04/30/2020. ?She tried tamoxifen for 2 months since her last visit with Dr. Darnelle Catalan.  She tells me that she has vaginal discharge which is very bothersome and she does not believe that the benefit from tamoxifen is worth all the side effects since discontinued it.  She today has discussed about multitude of issues.  She brings up intermittent headaches which were recently thought to be relieved with caffeine, she has noticed some random wheezing and shortness of breath which was thought to be related to reactive airway disease, followed by pulmonology, she also brings up the role of mammograms versus MRIs for the management of breast cancer surveillance, benefit of tamoxifen, adverse effects of tamoxifen.  ?She recently had an MRI which was without any evidence of malignancy.  She denies any changes in her breast.  Rest of the pertinent 10 point ROS reviewed and negative. ? ?REVIEW  OF SYSTEMS: ? COVID 19 VACCINATION STATUS: Pfizer x1, most recently 01/2019 ? ? ?HISTORY OF CURRENT ILLNESS: ?From the original intake note: ? ?Cheyenne Garner was initially evaluated in 05/2017 for a right nipple/breast mass. Diagnostic mammography showed no evidence of malignancy, and she was subsequently referred to Dr. Carolynne Edouard for further evaluation. She elected for follow up of the lesion. She was then seen by dermatology, who drained the lesion. The lesion resolved but began to reoccur 6 months later. She underwent bilateral diagnostic mammography with tomography and right breast ultrasonography at [facility] on 10/21/2019 showing: breast density category C; dilated duct and nipple discharge from right breast; no intraductal mass or mammographic abnormality identified; right axilla negative for adenopathy. ? ?She underwent breast MRI on 11/11/2019 showing: breast composition C; 1.8 cm linear clumped non-mass enhancement within lower right breast, 6 o'clock; 8 mm irregular enhancing mass within upper-outer left breast; benign-appearing 1.5 cm nonenhancing fluid-intensity mass/collection within right nipple with probable subareolar extension, corresponding to clinical area of concern. ? ?Accordingly on 12/04/2019 she proceeded to biopsy of the bilateral breast areas in question. The pathology from this procedure ([id number]) showed:  ?1. Right Breast ? - fibrocystic changes ? - focal atypical lobular hyperplasia  --concordant ?  ?2. Left Breast ? - benign breast tissue --discordant ? ?As noted, the right breast biopsy was found to be concordant, but the left breast biopsy was discordant. She proceeded to bilateral lumpectomies on 02/12/2020 under Dr. Carolynne Edouard. Pathology from the procedure ([id number]) showed: ?1. Right Breast ? - fibrocystic change ? - columnar cell change ? - usual ductal hyperplasia ? - intraductal papilloma ? - associated microcalcifications ?2. Left Breast ? 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 - ductal carcinoma in situ, intermediate grade ? - invasive carcinoma transects lateral margin ? - Prognostic indicators significant for: estrogen receptor, 95% positive and progesterone receptor, 40% positive, both with moderate staining intensity. Proliferation marker Ki67 at 2%. HER2 equivocal by immunohistochemistry (2+), but negative by fluorescent in situ hybridization with a signals ratio 1.19 and number per cell 1.73. ? ?She underwent re-excision of the positive margin, as well as left sentinel lymph node biopsy, on 03/04/2020. Pathology [id number]) showed: no residual carcinoma; benign lymph nodes (0/3). ? ? Cancer Staging  ?Breast cancer in female [facility]) ?Staging form: Breast, AJCC 8th Edition ?- Pathologic stage from 03/09/2020: Stage IA (pT1a, pN0, cM0, G2, ER+, PR+, HER2-) - Signed by Chauncey Cruel, MD on 03/16/2020 ?Stage prefix: Initial diagnosis ?Multigene prognostic tests performed: None ?Histologic grading system: 3 grade system ? ?The patient's subsequent history is as detailed below. ? ? ?PAST MEDICAL HISTORY: ?Past Medical History:  ?Diagnosis Date  ? Allergies   ? Anxiety   ? Asthma   ? cough variant asthma developed in last couple of years  ? Bipolar 1 disorder ([place])   ? Breast cancer ([place]) 02/12/2020  ? left lumpectomy IDC w/radiation no chemo  ? Cervical cancer screening 09/12/2016  ? Colon polyp 09/12/2016  ? Family history of bladder cancer   ? Family history of breast cancer   ? Family history of ovarian cancer   ? Family history of prostate cancer   ? Hx of colonic polyp 09/12/2016  ? Colonoscopy 2017 with 1 small polyp, done by Dr [name], repeat colonoscopy in 5 years, 2022  ? Hypothyroidism   ? Personal history of radiation therapy   ? Preventative health care 07/23/2015  ? Thyroid disease   ? ? ?PAST SURGICAL HISTORY: ?Past Surgical History:  ?Procedure Laterality Date  ? APPENDECTOMY    ? BREAST LUMPECTOMY WITH RADIOACTIVE SEED LOCALIZATION Bilateral 02/12/2020  ?  Procedure: BILATERAL BREAST LUMPECTOMY WITH RADIOACTIVE SEED LOCALIZATION;  Surgeon: Jovita Kussmaul, MD;  Location: [place];  Service: General;  Laterality: Bilateral;  ? RE-EXCISION OF BREAST LUMPECTOMY Left 03/04/2020  ? Procedure: LEFT BREAST MARGIN REEXCISION;  Surgeon: Jovita Kussmaul, MD;  Location: [place];  Service: General;  Laterality: Left;  ? SENTINEL NODE BIOPSY Left 03/04/2020  ? Procedure: SENTINEL NODE BIOPSY;  Surgeon: Jovita Kussmaul, MD;  Location: [place];  Service: General;  Laterality: Left;  ? SKIN BIOPSY    ? back moles removed (precancer?)  ? ? ?FAMILY HISTORY: ?Family History  ?Problem Relation Age of Onset  ? Arthritis Mother   ? Dementia Mother   ? Hypertension Father   ? Heart disease Father   ?     MI, s/p triple bypass at age 49  ? Neuropathy Father   ?     toxic peripheral   ? Cancer Paternal Grandfather   ?     young, lung cancer?, heavy smoker  ? Cholelithiasis Sister   ? Aortic aneurysm Sister 29  ? Aneurysm Sister   ?     descending aortic   ? Mental retardation Sister   ?     ADD, schizoaffective d/o  ? Obesity Sister   ? Arthritis Brother   ?     back disease  ? Ovarian cancer Cousin 30  ?     maternal second cousin (MGF's great-niece)  ? Dementia Maternal Grandmother   ? Osteoarthritis Maternal Grandmother   ?  Macular degeneration Maternal Grandfather   ? Osteoarthritis Maternal Grandfather   ? Congestive Heart Failure Paternal Grandmother   ? Breast cancer Cousin 45  ?     paternal first cousin  ? Prostate cancer Paternal Uncle   ?     spread to bladder, d. 60s/70s  ? Cancer Niece 59  ?     soft tissue sarcoma  ?From the genetic counselors note (February 2022): ? ?"Ms. Jent's mother is alive at age 27 and may have had basal cell carcinoma of her face (unconfirmed). There were three maternal aunts. There is no known cancer among maternal aunts/uncles or maternal first cousins. Ms. Shanks maternal grandmother died at age 44  without cancer. Her maternal grandfather died at age 9 without cancer. A maternal second cousin (MGF's great-niece) died from ovarian cancer at age 38. A maternal first cousin once removed (MGF's niece)

## 2021-05-09 ENCOUNTER — Ambulatory Visit: Payer: BC Managed Care – PPO | Attending: General Surgery

## 2021-05-09 VITALS — Wt 144.4 lb

## 2021-05-09 DIAGNOSIS — Z483 Aftercare following surgery for neoplasm: Secondary | ICD-10-CM | POA: Insufficient documentation

## 2021-05-09 NOTE — Therapy (Signed)
?OUTPATIENT PHYSICAL THERAPY SOZO SCREENING NOTE ? ? ?Patient Name: Cheyenne Garner ?MRN: [id number] ?DOB:1965-11-29, 56 y.o., female ?Today's Date: 05/09/2021 ? ?PCP: Mosie Lukes, MD ?REFERRING PROVIDER: Mosie Lukes, MD ? ? PT End of Session - 05/09/21 0957   ? ? Visit Number 14   # unchanged due to screen only  ? PT Start Time [phone]   ? PT Stop Time 0959   ? PT Time Calculation (min) 5 min   ? Activity Tolerance Patient tolerated treatment well   ? Behavior During Therapy [facility] for tasks assessed/performed   ? ?  ?  ? ?  ? ? ?Past Medical History:  ?Diagnosis Date  ? Allergies   ? Anxiety   ? Asthma   ? cough variant asthma developed in last couple of years  ? Bipolar 1 disorder ([place])   ? Breast cancer ([place]) 02/12/2020  ? left lumpectomy IDC w/radiation no chemo  ? Cervical cancer screening 09/12/2016  ? Colon polyp 09/12/2016  ? Family history of bladder cancer   ? Family history of breast cancer   ? Family history of ovarian cancer   ? Family history of prostate cancer   ? Hx of colonic polyp 09/12/2016  ? Colonoscopy 2017 with 1 small polyp, done by Dr [name], repeat colonoscopy in 5 years, 2022  ? Hypothyroidism   ? Personal history of radiation therapy   ? Preventative health care 07/23/2015  ? Thyroid disease   ? ?Past Surgical History:  ?Procedure Laterality Date  ? APPENDECTOMY    ? BREAST LUMPECTOMY WITH RADIOACTIVE SEED LOCALIZATION Bilateral 02/12/2020  ? Procedure: BILATERAL BREAST LUMPECTOMY WITH RADIOACTIVE SEED LOCALIZATION;  Surgeon: Jovita Kussmaul, MD;  Location: [place];  Service: General;  Laterality: Bilateral;  ? RE-EXCISION OF BREAST LUMPECTOMY Left 03/04/2020  ? Procedure: LEFT BREAST MARGIN REEXCISION;  Surgeon: Jovita Kussmaul, MD;  Location: [place];  Service: General;  Laterality: Left;  ? SENTINEL NODE BIOPSY Left 03/04/2020  ? Procedure: SENTINEL NODE BIOPSY;  Surgeon: Jovita Kussmaul, MD;  Location: [place];  Service: General;   Laterality: Left;  ? SKIN BIOPSY    ? back moles removed (precancer?)  ? ?Patient Active Problem List  ? Diagnosis Date Noted  ? Hyperglycemia 11/08/2020  ? Elevated sed rate 11/08/2020  ? Left upper lobe pneumonia 08/15/2020  ? [place] 07/14/2020  ? Malignant neoplasm of overlapping sites of left breast in female, estrogen receptor positive ([place]) 05/18/2020  ? Genetic testing 04/07/2020  ? Hypothyroidism   ? Anxiety   ? Allergies   ? FH: thoracic aortic aneurysm 03/23/2020  ? Family history of breast cancer   ? Family history of ovarian cancer   ? Family history of bladder cancer   ? Family history of prostate cancer   ? Breast cancer in female [facility]) 03/09/2020  ? Bilateral dry eyes 01/27/2019  ? Cervical cancer screening 09/12/2016  ? Hx of colonic polyp 09/12/2016  ? Colon polyp 09/12/2016  ? Preventative health care 07/23/2015  ? Asthma   ? Thyroid disease   ? Bipolar affective ([place])   ? Urinary incontinence 12/25/2011  ? ? ?REFERRING DIAG: left breast cancer at risk for lymphedema ? ?THERAPY DIAG:  ?Aftercare following surgery for neoplasm ? 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 ?PRECAUTIONS: left UE Lymphedema risk, None ? ?SUBJECTIVE: Pt returns for her 3 month L-Dex screen.  ? ?PAIN:  ?Are you having pain? No ? ?SOZO SCREENING: ?Patient was assessed today using the SOZO machine to determine the lymphedema index score. This was compared to her baseline score. It was determined that she is within the recommended range when compared to her baseline and no further action is needed at this time. She will continue SOZO screenings. These are done every 3 months for 2 years post operatively followed by every 6 months for 2 years, and then annually. ? ? ? ?Otelia Limes, PTA ?05/09/2021,  9:59 AM ? ?  ? ?

## 2021-05-10 ENCOUNTER — Encounter (HOSPITAL_COMMUNITY): Payer: Self-pay

## 2021-05-18 DIAGNOSIS — R5383 Other fatigue: Secondary | ICD-10-CM | POA: Diagnosis not present

## 2021-05-18 DIAGNOSIS — E063 Autoimmune thyroiditis: Secondary | ICD-10-CM | POA: Diagnosis not present

## 2021-05-18 DIAGNOSIS — E559 Vitamin D deficiency, unspecified: Secondary | ICD-10-CM | POA: Diagnosis not present

## 2021-05-18 DIAGNOSIS — Z13220 Encounter for screening for lipoid disorders: Secondary | ICD-10-CM | POA: Diagnosis not present

## 2021-05-18 DIAGNOSIS — R7982 Elevated C-reactive protein (CRP): Secondary | ICD-10-CM | POA: Diagnosis not present

## 2021-05-18 DIAGNOSIS — Z131 Encounter for screening for diabetes mellitus: Secondary | ICD-10-CM | POA: Diagnosis not present

## 2021-06-15 ENCOUNTER — Ambulatory Visit: Payer: BC Managed Care – PPO | Admitting: Cardiology

## 2021-06-15 ENCOUNTER — Encounter: Payer: Self-pay | Admitting: Cardiology

## 2021-06-15 VITALS — BP 138/86 | HR 72 | Ht 66.5 in | Wt 147.4 lb

## 2021-06-15 DIAGNOSIS — E039 Hypothyroidism, unspecified: Secondary | ICD-10-CM | POA: Diagnosis not present

## 2021-06-15 DIAGNOSIS — I77819 Aortic ectasia, unspecified site: Secondary | ICD-10-CM | POA: Diagnosis not present

## 2021-06-15 DIAGNOSIS — G4700 Insomnia, unspecified: Secondary | ICD-10-CM | POA: Diagnosis not present

## 2021-06-15 DIAGNOSIS — Z79899 Other long term (current) drug therapy: Secondary | ICD-10-CM | POA: Diagnosis not present

## 2021-06-15 DIAGNOSIS — R5383 Other fatigue: Secondary | ICD-10-CM | POA: Diagnosis not present

## 2021-06-15 DIAGNOSIS — F3161 Bipolar disorder, current episode mixed, mild: Secondary | ICD-10-CM | POA: Diagnosis not present

## 2021-06-15 NOTE — Progress Notes (Signed)
Cardiology Office Note:    Date:  06/15/2021   ID:  Cheyenne Garner, DOB Jan 19, 1965, MRN [id number]  PCP:  Mosie Lukes, MD  Cardiologist:  Berniece Salines, DO  Electrophysiologist:  None   Referring MD: Mosie Lukes, MD   " I am doing fine"   History of Present Illness:    Cheyenne Garner is a 56 y.o. female with a hx of  left breast cancer recently diagnosed in February 2022 status post surgery and radiation, Hashimoto syndrome, bipolar disorder, anxiety and asthma is here today for follow-up visit.  Since I saw the patient she has completed treatment for her breast cancer.  In the interim she did undergo treatment for pneumonia.  But overall she is doing well.  She has no chest pain or shortness of breath.   Past Medical History:  Diagnosis Date   Allergies    Anxiety    Asthma    cough variant asthma developed in last couple of years   Bipolar 1 disorder ([place])    Breast cancer ([place]) 02/12/2020   left lumpectomy IDC w/radiation no chemo   Cervical cancer screening 09/12/2016   Colon polyp 09/12/2016   Family history of bladder cancer    Family history of breast cancer    Family history of ovarian cancer    Family history of prostate cancer    Hx of colonic polyp 09/12/2016   Colonoscopy 2017 with 1 small polyp, done by Dr [name], repeat colonoscopy in 5 years, 2022   Hypothyroidism    Personal history of radiation therapy    Preventative health care 07/23/2015   Thyroid disease     Past Surgical History:  Procedure Laterality Date   APPENDECTOMY     BREAST LUMPECTOMY WITH RADIOACTIVE SEED LOCALIZATION Bilateral 02/12/2020   Procedure: BILATERAL BREAST LUMPECTOMY WITH RADIOACTIVE SEED LOCALIZATION;  Surgeon: Jovita Kussmaul, MD;  Location: [place];  Service: General;  Laterality: Bilateral;   RE-EXCISION OF BREAST LUMPECTOMY Left 03/04/2020   Procedure: LEFT BREAST MARGIN REEXCISION;  Surgeon: Jovita Kussmaul, MD;  Location: [place];   Service: General;  Laterality: Left;   SENTINEL NODE BIOPSY Left 03/04/2020   Procedure: SENTINEL NODE BIOPSY;  Surgeon: [name], MD;  Location: [place];  Service: General;  Laterality: Left;   SKIN BIOPSY     back moles removed (precancer?)    Current Medications: Current Meds  Medication Sig   albuterol (VENTOLIN HFA) 108 (90 Base) MCG/ACT inhaler Inhale 2 puffs into the lungs every 6 (six) hours as needed for wheezing or shortness of breath.   CALCIUM PO Take by mouth daily. Bone Up   Cholecalciferol (VITAMIN D-3) 125 MCG (5000 UT) TABS Take 1 tablet by mouth daily at 6 (six) AM.   lithium 300 MG tablet Take 300 mg by mouth 2 (two) times daily.   OVER THE COUNTER MEDICATION daily. Iodoral   OVER THE COUNTER MEDICATION daily. ashwagandha   Probiotic Product (PROBIOTIC-10 PO) Take 1 capsule by mouth daily.   tamoxifen (NOLVADEX) 10 MG tablet Take 1 tablet (10 mg total) by mouth daily.   thyroid (ARMOUR) 90 MG tablet Take 90 mg by mouth daily.   Zinc 50 MG TABS Take 50 mg by mouth daily.     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 No major dietary restrictions   Social Determinants of Radio broadcast assistant Strain: Not on file  Food Insecurity: Not on file  Transportation Needs: Not on file  Physical Activity: Not on file  Stress: Not on file  Social  Connections: Not on file     Family History: The patient's family history includes Aneurysm in her sister; Aortic aneurysm (age of onset: 33) in her sister; Arthritis in her brother and mother; Breast cancer (age of onset: 7) in her cousin; Cancer in her paternal grandfather; Cancer (age of onset: 89) in her niece; Cholelithiasis in her sister; Congestive Heart Failure in her paternal grandmother; Dementia in her maternal grandmother and mother; Heart disease in her father; Hypertension in her father; Macular degeneration in her maternal grandfather; Mental retardation in her sister; Neuropathy in her father; Obesity in her sister; Osteoarthritis in her maternal grandfather and maternal grandmother; Ovarian cancer (age of onset: 68) in her cousin; Prostate cancer in her paternal uncle.  ROS:   Review of Systems  Constitution: Negative for decreased appetite, fever and weight gain.  HENT: Negative for congestion, ear discharge, hoarse voice and sore throat.   Eyes: Negative for discharge, redness, vision loss in right eye and visual halos.  Cardiovascular: Negative for chest pain, dyspnea on exertion, leg swelling, orthopnea and palpitations.  Respiratory: Negative for cough, hemoptysis, shortness of breath and snoring.   Endocrine: Negative for heat intolerance and polyphagia.  Hematologic/Lymphatic: Negative for bleeding problem. Does not bruise/bleed easily.  Skin: Negative for flushing, nail changes, rash and suspicious lesions.  Musculoskeletal: Negative for arthritis, joint pain, muscle cramps, myalgias, neck pain and stiffness.  Gastrointestinal: Negative for abdominal pain, bowel incontinence, diarrhea and excessive appetite.  Genitourinary: Negative for decreased libido, genital sores and incomplete emptying.  Neurological: Negative for brief paralysis, focal weakness, headaches and loss of balance.  Psychiatric/Behavioral: Negative for altered mental status, depression and suicidal  ideas.  Allergic/Immunologic: Negative for HIV exposure and persistent infections.    EKGs/Labs/Other Studies Reviewed:    The following studies were reviewed today:   EKG: None today  CCTA 04/21/2020   Aorta:  Normal size (35.6 cm).  No calcifications.  No dissection.   Aortic Valve:  Trileaflet.  No calcifications.   Coronary Arteries:  Normal coronary origin.  Right dominance.   RCA is a large dominant artery that gives rise to PDA and PLA. There is no plaque.   Left main is a large artery that gives rise to LAD and LCX arteries.   LAD is a large vessel that has no plaque.   LCX is a non-dominant artery that gives rise to one large OM1 branch. There is no plaque.   Other findings:   Normal pulmonary vein drainage into the left atrium.   Normal left atrial appendage without a thrombus.   Normal size of the pulmonary artery.   IMPRESSION: 1. Coronary calcium score  of 0. This was 0 percentile for age and sex matched control.   2. Normal coronary origin with right dominance.   3. No evidence of CAD. CAD-RADS 0. No evidence of CAD (0%). Consider non-atherosclerotic causes of chest pain.   TTE 03/2020 IMPRESSIONS     1. Global longitudinal strain is -21.5%. Left ventricular ejection  fraction, by estimation, is 60 to 65%. The left ventricle has normal  function. The left ventricle has no regional wall motion abnormalities.  Left ventricular diastolic parameters were  normal.   2. Right ventricular systolic function is normal. The right ventricular  size is normal. There is normal pulmonary artery systolic pressure.   3. The mitral valve is normal in structure. No evidence of mitral valve  regurgitation.   4. The aortic valve is normal in structure. Aortic valve regurgitation is  not visualized.   5. The inferior vena cava is normal in size with greater than 50%  respiratory variability, suggesting right atrial pressure of 3 mmHg.   FINDINGS   Left Ventricle:  Global longitudinal strain is -21.5%. Left ventricular  ejection fraction, by estimation, is 60 to 65%. The left ventricle has  normal function. The left ventricle has no regional wall motion  abnormalities. The left ventricular internal  cavity size was normal in size. There is no left ventricular hypertrophy.  Left ventricular diastolic parameters were normal.   Right Ventricle: The right ventricular size is normal. Right vetricular  wall thickness was not assessed. Right ventricular systolic function is  normal. There is normal pulmonary artery systolic pressure. The tricuspid  regurgitant velocity is 2.13 m/s,  and with an assumed right atrial pressure of 3 mmHg, the estimated right  ventricular systolic pressure is 23.5 mmHg.   Left Atrium: Left atrial size was normal in size.   Right Atrium: Right atrial size was normal in size.   Pericardium: There is no evidence of pericardial effusion.   Mitral Valve: The mitral valve is normal in structure. No evidence of  mitral valve regurgitation.   Tricuspid Valve: The tricuspid valve is normal in structure. Tricuspid  valve regurgitation is trivial.   Aortic Valve: The aortic valve is normal in structure. Aortic valve  regurgitation is not visualized.   Pulmonic Valve: The pulmonic valve was normal in structure. Pulmonic valve  regurgitation is not visualized.   Aorta: The aortic root and ascending aorta are structurally normal, with  no evidence of dilitation.   Venous: The inferior vena cava is normal in size with greater than 50%  respiratory variability, suggesting right atrial pressure of 3 mmHg.   IAS/Shunts: No atrial level shunt detected by color flow Doppler.      CTA chest 04/21/2020 FINDINGS: Cardiovascular: Fusiform ectasia of the ascending thoracic aorta with a maximal diameter of 3.7 cm. The aortic root is normal in caliber. No significant atherosclerotic plaque. The heart is normal in size. No pericardial  effusion. Normal caliber main pulmonary artery. Conventional 3 vessel arch anatomy.   Mediastinum/Nodes: Unremarkable CT appearance of the thyroid gland. No suspicious mediastinal or hilar adenopathy. No soft tissue mediastinal mass. The thoracic esophagus is unremarkable.   Lungs/Pleura: 4 mm subpleural nodule along the posterior aspect of the right upper lobe (image 27 series 7). 4 mm nodule in the right middle lobe (image 85 of series 7). 5 mm nodule in the inferior periphery of the lingula (image 109 series 7). The lungs are otherwise clear. No acute abnormality.   Upper Abdomen: No acute abnormality.  Musculoskeletal: No chest wall abnormality. No acute or significant osseous findings.   Review of the MIP images confirms the above findings.   IMPRESSION: 1. Fusiform ectasia of the ascending thoracic aorta with a maximal diameter of 3.7 cm. While not technically aneurysmal by size criteria (less than 4 cm), this does appear enlarged relative to the patient's body size and therefore imaging surveillance is recommended. Recommend follow-up CT a chest in 1-2 years. 2. Scattered small pulmonary nodules measuring no more than 5 mm. No follow-up needed if patient is low-risk. Non-contrast chest CT can be considered in 12 months if patient is high-risk. This recommendation follows the consensus statement: Guidelines for Management of Incidental Pulmonary Nodules Detected on CT Images: From the Fleischner Society 2017; Radiology 2017; 284:228-243.  Recent Labs: 03/24/2021: TSH 1.50 05/03/2021: ALT 13; BUN 27; Creatinine 0.90; Hemoglobin 13.8; Platelet Count 153; Potassium 4.3; Sodium 139  Recent Lipid Panel    Component Value Date/Time   CHOL 163 03/24/2021 1049   TRIG 73.0 03/24/2021 1049   HDL 71.70 03/24/2021 1049   CHOLHDL 2 03/24/2021 1049   VLDL 14.6 03/24/2021 1049   LDLCALC 76 03/24/2021 1049    Physical Exam:    VS:  BP 140/90   Pulse 72   Ht 5' 6.5" (1.689 m)    Wt 147 lb 6.4 oz (66.9 kg)   SpO2 98%   BMI 23.43 kg/m     Wt Readings from Last 3 Encounters:  06/15/21 147 lb 6.4 oz (66.9 kg)  05/09/21 144 lb 6 oz (65.5 kg)  05/03/21 146 lb 9.6 oz (66.5 kg)     GEN: Well nourished, well developed in no acute distress HEENT: Normal NECK: No JVD; No carotid bruits LYMPHATICS: No lymphadenopathy CARDIAC: S1S2 noted,RRR, no murmurs, rubs, gallops RESPIRATORY:  Clear to auscultation without rales, wheezing or rhonchi  ABDOMEN: Soft, non-tender, non-distended, +bowel sounds, no guarding. EXTREMITIES: No edema, No cyanosis, no clubbing MUSCULOSKELETAL:  No deformity  SKIN: Warm and dry NEUROLOGIC:  Alert and oriented x 3, non-focal PSYCHIATRIC:  Normal affect, good insight  ASSESSMENT:    1. Aortic dilatation (HCC)   2. Medication management    PLAN:     1.  She is doing well from a cardiovascular standpoint.  Previous imaging had shown concern for aortic dilatation.  We will get a CT of the chest for follow-up/confirmation.  2.  Blood pressure is within target.  No changes. The patient is in agreement with the above plan. The patient left the office in stable condition.  The patient will follow up with me as needed.   Medication Adjustments/Labs and Tests Ordered: Current medicines are reviewed at length with the patient today.  Concerns regarding medicines are outlined above.  Orders Placed This Encounter  Procedures   CT ANGIO CHEST AORTA W &/OR WO CONTRAST   Basic Metabolic Panel (BMET)   Magnesium   No orders of the defined types were placed in this encounter.   Patient Instructions  Medication Instructions:  Your physician recommends that you continue on your current medications as directed. Please refer to the Current Medication list given to you today.  *If you need a refill on your cardiac medications before your next appointment, please call your pharmacy*   Lab Work: Your physician recommends that you return for lab  work in:  TODAY: BMET, [place] If you have labs (blood work) drawn today and your tests are completely normal, you will receive your results only by: [employer] (if  you have MyChart) OR A paper copy in the mail If you have any lab test that is abnormal or we need to change your treatment, we will call you to review the results.   Testing/Procedures: Non-Cardiac CT Angiography (CTA), is a special type of CT scan that uses a computer to produce multi-dimensional views of major blood vessels throughout the body. In CT angiography, a contrast material is injected through an IV to help visualize the blood vessels   Follow-Up: At [facility], you and your health needs are our priority.  As part of our continuing mission to provide you with exceptional heart care, we have created designated Provider Care Teams.  These Care Teams include your primary Cardiologist (physician) and Advanced Practice Providers (APPs -  Physician Assistants and Nurse Practitioners) who all work together to provide you with the care you need, when you need it.  We recommend signing up for the patient portal called "MyChart".  Sign up information is provided on this After Visit Summary.  MyChart is used to connect with patients for Virtual Visits (Telemedicine).  Patients are able to view lab/test results, encounter notes, upcoming appointments, etc.  Non-urgent messages can be sent to your provider as well.   To learn more about what you can do with MyChart, go to [email or web address].    Your next appointment:   As needed   The format for your next appointment:   In Person  Provider:   Berniece Salines, DO     Other Instructions   Important Information About Sugar         Adopting a Healthy Lifestyle.  Know what a healthy weight is for you (roughly BMI <25) and aim to maintain this   Aim for 7+ servings of fruits and vegetables daily   65-80+ fluid ounces of water or unsweet tea for healthy kidneys    Limit to max 1 drink of alcohol per day; avoid smoking/tobacco   Limit animal fats in diet for cholesterol and heart health - choose grass fed whenever available   Avoid highly processed foods, and foods high in saturated/trans fats   Aim for low stress - take time to unwind and care for your mental health   Aim for 150 min of moderate intensity exercise weekly for heart health, and weights twice weekly for bone health   Aim for 7-9 hours of sleep daily   When it comes to diets, agreement about the perfect plan isnt easy to find, even among the experts. Experts at the [employer] developed an idea known as the Healthy Eating Plate. Just imagine a plate divided into logical, healthy portions.   The emphasis is on diet quality:   Load up on vegetables and fruits - one-half of your plate: Aim for color and variety, and remember that potatoes dont count.   Go for whole grains - one-quarter of your plate: Whole wheat, barley, wheat berries, quinoa, oats, brown rice, and foods made with them. If you want pasta, go with whole wheat pasta.   Protein power - one-quarter of your plate: Fish, chicken, beans, and nuts are all healthy, versatile protein sources. Limit red meat.   The diet, however, does go beyond the plate, offering a few other suggestions.   Use healthy plant oils, such as olive, canola, soy, corn, sunflower and peanut. Check the labels, and avoid partially hydrogenated oil, which have unhealthy trans fats.   If youre thirsty, drink water. Coffee and tea are good  in moderation, but skip sugary drinks and limit milk and dairy products to one or two daily servings.   The type of carbohydrate in the diet is more important than the amount. Some sources of carbohydrates, such as vegetables, fruits, whole grains, and beans-are healthier than others.   Finally, stay active  Signed, Berniece Salines, DO  06/15/2021 11:45 AM    [place]

## 2021-06-15 NOTE — Patient Instructions (Addendum)
Medication Instructions:  Your physician recommends that you continue on your current medications as directed. Please refer to the Current Medication list given to you today.  *If you need a refill on your cardiac medications before your next appointment, please call your pharmacy*   Lab Work: Your physician recommends that you return for lab work in:  TODAY: BMET, [place] If you have labs (blood work) drawn today and your tests are completely normal, you will receive your results only by: MyChart Message (if you have [place]) OR A paper copy in the mail If you have any lab test that is abnormal or we need to change your treatment, we will call you to review the results.   Testing/Procedures: Non-Cardiac CT Angiography (CTA), is a special type of CT scan that uses a computer to produce multi-dimensional views of major blood vessels throughout the body. In CT angiography, a contrast material is injected through an IV to help visualize the blood vessels   Follow-Up: At [facility], you and your health needs are our priority.  As part of our continuing mission to provide you with exceptional heart care, we have created designated Provider Care Teams.  These Care Teams include your primary Cardiologist (physician) and Advanced Practice Providers (APPs -  Physician Assistants and Nurse Practitioners) who all work together to provide you with the care you need, when you need it.  We recommend signing up for the patient portal called "MyChart".  Sign up information is provided on this After Visit Summary.  MyChart is used to connect with patients for Virtual Visits (Telemedicine).  Patients are able to view lab/test results, encounter notes, upcoming appointments, etc.  Non-urgent messages can be sent to your provider as well.   To learn more about what you can do with MyChart, go to [email or web address].    Your next appointment:   As needed   The format for your next appointment:   In  Person  Provider:   Berniece Salines, DO     Other Instructions   Important Information About Sugar

## 2021-06-28 ENCOUNTER — Encounter: Payer: Self-pay | Admitting: Hematology and Oncology

## 2021-06-28 ENCOUNTER — Encounter: Payer: Self-pay | Admitting: Family Medicine

## 2021-06-28 DIAGNOSIS — F319 Bipolar disorder, unspecified: Secondary | ICD-10-CM

## 2021-06-28 DIAGNOSIS — F419 Anxiety disorder, unspecified: Secondary | ICD-10-CM

## 2021-06-28 DIAGNOSIS — F3111 Bipolar disorder, current episode manic without psychotic features, mild: Secondary | ICD-10-CM | POA: Diagnosis not present

## 2021-06-30 ENCOUNTER — Telehealth: Payer: Self-pay

## 2021-06-30 NOTE — Telephone Encounter (Signed)
Pt called requesting dose reduction of Tamoxifen to '5mg'$ . Patient has already started to cut 10 mg tabs in half. Pt processed ambivalence regarding continuing Tamoxifin vs stopping. Requesting further direction from Dr. Chryl Heck.

## 2021-07-04 ENCOUNTER — Telehealth: Payer: Self-pay | Admitting: *Deleted

## 2021-07-04 DIAGNOSIS — Z170 Estrogen receptor positive status [ER+]: Secondary | ICD-10-CM | POA: Diagnosis not present

## 2021-07-04 DIAGNOSIS — C50412 Malignant neoplasm of upper-outer quadrant of left female breast: Secondary | ICD-10-CM | POA: Diagnosis not present

## 2021-07-04 NOTE — Telephone Encounter (Signed)
Per Dr.Iruku, called pt to request televisit for pt. Pt agreed for June 20th at 3:15 p. High priority message was sent to scheduling to make appt

## 2021-07-05 ENCOUNTER — Inpatient Hospital Stay: Payer: BC Managed Care – PPO | Attending: Hematology and Oncology | Admitting: Hematology and Oncology

## 2021-07-05 ENCOUNTER — Encounter: Payer: Self-pay | Admitting: Hematology and Oncology

## 2021-07-05 DIAGNOSIS — Z170 Estrogen receptor positive status [ER+]: Secondary | ICD-10-CM | POA: Diagnosis not present

## 2021-07-05 DIAGNOSIS — Z8052 Family history of malignant neoplasm of bladder: Secondary | ICD-10-CM

## 2021-07-05 DIAGNOSIS — Z8042 Family history of malignant neoplasm of prostate: Secondary | ICD-10-CM | POA: Diagnosis not present

## 2021-07-05 DIAGNOSIS — Z803 Family history of malignant neoplasm of breast: Secondary | ICD-10-CM | POA: Diagnosis not present

## 2021-07-05 DIAGNOSIS — C50812 Malignant neoplasm of overlapping sites of left female breast: Secondary | ICD-10-CM

## 2021-07-05 DIAGNOSIS — Z8041 Family history of malignant neoplasm of ovary: Secondary | ICD-10-CM

## 2021-07-05 NOTE — Progress Notes (Signed)
[place]  [phone] [phone]     ID: Cheyenne Garner DOB: [birth year]  MR#: [id number]  [id number]#:[id number]  Patient Care Team: Mosie Lukes, MD as PCP - General (Family Medicine) Berniece Salines, DO as PCP - Cardiology (Cardiology) Jovita Kussmaul, MD as Consulting Physician (General Surgery) Magrinat, Virgie Dad, MD (Inactive) as Consulting Physician (Oncology) Kyung Rudd, MD as Consulting Physician (Radiation Oncology) Arta Silence, MD as Consulting Physician (Gastroenterology) [place], Jennefer Bravo, MD as Referring Physician (Dermatology) Rockwell Germany, RN as Oncology Nurse Navigator Mauro Kaufmann, RN as Oncology Nurse Navigator Paula Compton, MD as Consulting Physician (Obstetrics and Gynecology) Benay Pike, MD OTHER MD:  CHIEF COMPLAINT: Estrogen receptor positive breast cancer  CURRENT TREATMENT:  None  INTERVAL HISTORY:  Janavia returns today for follow up of her estrogen receptor positive breast cancer. She received radiation therapy from 04/05/2020 through 04/30/2020. She is here for a telephone visit. She was wondering if she can try taking tamoxifen 5 mg once daily. When she last saw [place], she complained of discharge, but I don't recollect brown discharge that was reported to [place].  She now said she didn't have any brown discharge, since she stopped tamoxifen 20 mg po daily, which was puzzling. Currently she only reports increased discharge but no blood in it. She mentioned the brown discharge to Dr. Marlou Starks yesterday who recommended gynecology evaluation, she is scheduled to follow-up with her gynecologist this Friday. She mentions that she is very worried about the impaired glucose tolerance with fasting glucose of 100 and she wonders if tamoxifen has something to do with this.  She also is worried about the mild thrombocytopenia.   REVIEW OF SYSTEMS:  COVID 19 VACCINATION STATUS: [place] x1, most recently 01/2019   HISTORY  OF CURRENT ILLNESS: From the original intake note:  Cheyenne Garner was initially evaluated in 05/2017 for a right nipple/breast mass. Diagnostic mammography showed no evidence of malignancy, and she was subsequently referred to Dr. Marlou Starks for further evaluation. She elected for follow up of the lesion. She was then seen by dermatology, who drained the lesion. The lesion resolved but began to reoccur 6 months later. She underwent bilateral diagnostic mammography with tomography and right breast ultrasonography at [facility] on 10/21/2019 showing: breast density category C; dilated duct and nipple discharge from right breast; no intraductal mass or mammographic abnormality identified; right axilla negative for adenopathy.  She underwent breast MRI on 11/11/2019 showing: breast composition C; 1.8 cm linear clumped non-mass enhancement within lower right breast, 6 o'clock; 8 mm irregular enhancing mass within upper-outer left breast; benign-appearing 1.5 cm nonenhancing fluid-intensity mass/collection within right nipple with probable subareolar extension, corresponding to clinical area of concern.  Accordingly on 12/04/2019 she proceeded to biopsy of the bilateral breast areas in question. The pathology from this procedure ([id number]) showed:  1. Right Breast  - fibrocystic changes  - focal atypical lobular hyperplasia  --concordant   2. Left Breast  - benign breast tissue --discordant  As noted, the right breast biopsy was found to be concordant, but the left breast biopsy was discordant. She proceeded to bilateral lumpectomies on 02/12/2020 under Dr. Marlou Starks. Pathology from the procedure ([id number]) showed: 1. Right Breast  - fibrocystic change  - columnar cell change  - usual ductal hyperplasia  - intraductal papilloma  - associated microcalcifications 2. 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 Proliferation marker Ki67 at 2%. HER2 equivocal by immunohistochemistry (2+), but negative by fluorescent in situ hybridization with a signals ratio 1.19 and number per cell 1.73.  She underwent re-excision of the positive margin, as well as left sentinel lymph node biopsy, on 03/04/2020. Pathology [id number]) showed: no residual carcinoma; benign lymph nodes (0/3).   Cancer Staging  Breast cancer in female [facility]) Staging form: Breast, AJCC 8th Edition - Pathologic stage from 03/09/2020: Stage IA (pT1a, pN0, cM0, G2, ER+, PR+, HER2-) - Signed by Chauncey Cruel, MD on 03/16/2020 Stage prefix: Initial diagnosis Multigene prognostic tests performed: None Histologic grading system: 3 grade system  The patient's subsequent history is as detailed below.   PAST MEDICAL HISTORY: Past Medical History:  Diagnosis Date   Allergies    Anxiety    Asthma    cough variant asthma developed in last couple of years   Bipolar 1 disorder ([place])    Breast cancer ([place]) 02/12/2020   left lumpectomy IDC w/radiation no chemo   Cervical cancer screening 09/12/2016   Colon polyp 09/12/2016   Family history of bladder cancer    Family history of breast cancer    Family history of ovarian cancer    Family history of prostate cancer    Hx of colonic polyp 09/12/2016   Colonoscopy 2017 with 1 small polyp, done by Dr [name], repeat colonoscopy in 5 years, 2022   Hypothyroidism    Personal history of radiation therapy    Preventative health care 07/23/2015   Thyroid disease     PAST SURGICAL HISTORY: Past Surgical History:  Procedure Laterality Date   APPENDECTOMY     BREAST LUMPECTOMY WITH RADIOACTIVE SEED LOCALIZATION Bilateral 02/12/2020   Procedure: BILATERAL BREAST LUMPECTOMY WITH RADIOACTIVE SEED LOCALIZATION;  Surgeon:  Jovita Kussmaul, MD;  Location: [place];  Service: General;  Laterality: Bilateral;   RE-EXCISION OF BREAST LUMPECTOMY Left 03/04/2020   Procedure: LEFT BREAST MARGIN REEXCISION;  Surgeon: Jovita Kussmaul, MD;  Location: [place];  Service: General;  Laterality: Left;   SENTINEL NODE BIOPSY Left 03/04/2020   Procedure: SENTINEL NODE BIOPSY;  Surgeon: Jovita Kussmaul, MD;  Location: [place];  Service: General;  Laterality: Left;   SKIN BIOPSY     back moles removed (precancer?)    FAMILY HISTORY: Family History  Problem Relation Age of Onset   Arthritis Mother    Dementia Mother    Hypertension Father    Heart disease Father        MI, s/p triple bypass at age 35   Neuropathy Father        toxic peripheral    Cancer Paternal Grandfather        young, lung cancer?, heavy smoker   Cholelithiasis Sister    Aortic aneurysm Sister 78   Aneurysm Sister        descending aortic    Mental retardation Sister        ADD, schizoaffective d/o   Obesity Sister    Arthritis Brother        back disease   Ovarian cancer Cousin 26       maternal second cousin (MGF's great-niece)   Dementia Maternal Grandmother    Osteoarthritis Maternal Grandmother    Macular degeneration Maternal Grandfather    Osteoarthritis Maternal Grandfather  Congestive Heart Failure Paternal Grandmother    Breast cancer Cousin 49       paternal first cousin   Prostate cancer Paternal Uncle        spread to bladder, d. 21s/70s   Cancer Niece 11       soft tissue sarcoma  From the genetic counselors note (February 2022):  "Ms. Delehanty's mother is alive at age 65 and may have had basal cell carcinoma of her face (unconfirmed). There were three maternal aunts. There is no known cancer among maternal aunts/uncles or maternal first cousins. Ms. Caudell maternal grandmother died at age 73 without cancer. Her maternal grandfather died at age 32 without cancer. A maternal  second cousin (MGF's great-niece) died from ovarian cancer at age 42. A maternal first cousin once removed (MGF's niece) had an unknown type of gynecologic cancer in her 55s.    Ms. Pundt father is alive at age 70 and was diagnosed with bladder cancer at age 97. There are two paternal aunts and four paternal uncles. Once uncle died in his late 74s or early 67s with prostate cancer and bladder cancer. One paternal first cousin was diagnosed with breast cancer around age 62. Ms. Hirschfeld paternal grandmother died in her 53s without cancer. Her paternal grandfather died in his 29s from an unknown type of cancer (not colon cancer, possibly lung cancer). A paternal first cousin once removed (PGF's nephew) had lung cancer in his 3s, and died in his 59s with prostate cancer and bladder cancer.  "   GYNECOLOGIC HISTORY:   No LMP recorded. Patient is postmenopausal. Menarche: 56 years old Age at first live birth: 56 years old [place] P 2 LMP age 88 Contraceptive  HRT yes at least 4 years  Hysterectomy? no BSO? 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  Her husband Lynelle Smoke is an Chief Financial Officer.  He takes care of our blood analyzer is here and throughout the system.  Son Kevan Ny started forestry and lives in [place] week where he works for [employer] restoration taking care of their trails.  Son Alverda Skeans is 34   ADVANCED DIRECTIVES: In the absence of any documents to the contrary the patient's husband is her healthcare power of attorney   HEALTH MAINTENANCE: Social History   Tobacco Use   Smoking status: Never   Smokeless tobacco: Never  Substance Use Topics   Alcohol use: Yes    Comment: social   Drug use: No     Colonoscopy: 02/2015, Dr. [name], recall 2022  PAP: 08/2016, negative  Bone density: Never done   Allergies  Allergen Reactions   Gentamycin [Gentamicin]     Reacted to eye ointment, swelling red painful eyes    Current  Outpatient Medications  Medication Sig Dispense Refill   albuterol (VENTOLIN HFA) 108 (90 Base) MCG/ACT inhaler Inhale 2 puffs into the lungs every 6 (six) hours as needed for wheezing or shortness of breath. 1 Inhaler 2   CALCIUM PO Take by mouth daily. Bone Up     Cholecalciferol (VITAMIN D-3) 125 MCG (5000 UT) TABS Take 1 tablet by mouth daily at 6 (six) AM.     lithium 300 MG tablet Take 300 mg by mouth 2 (two) times daily.     OVER THE COUNTER MEDICATION daily. Iodoral     OVER THE COUNTER MEDICATION daily. ashwagandha     Probiotic Product (PROBIOTIC-10 PO) Take 1 capsule by mouth daily.  tamoxifen (NOLVADEX) 10 MG tablet Take 1 tablet (10 mg total) by mouth daily. 30 tablet 3   thyroid (ARMOUR) 90 MG tablet Take 90 mg by mouth daily.     Zinc 50 MG TABS Take 50 mg by mouth daily.     No current facility-administered medications for this visit.    OBJECTIVE: White woman in no acute distress  There were no vitals filed for this visit.    There is no height or weight on file to calculate BMI.   Wt Readings from Last 3 Encounters:  06/15/21 147 lb 6.4 oz (66.9 kg)  05/09/21 144 lb 6 oz (65.5 kg)  05/03/21 146 lb 9.6 oz (66.5 kg)      ECOG FS:1 - Symptomatic but completely ambulatory  Physical Exam Constitutional:      Appearance: Normal appearance.  Chest:     Comments: Bilateral breasts inspected.  Surgical scarring noted in the left breast upper outer quadrant at the area of previous surgical incision.  No other palpable masses or regional adenopathy noted Musculoskeletal:        General: No swelling.     Cervical back: Normal range of motion and neck supple. No rigidity.  Lymphadenopathy:     Cervical: No cervical adenopathy.  Neurological:     Mental Status: She is alert.   LAB RESULTS:  CMP     Component Value Date/Time   NA 139 05/03/2021 1113   NA 143 04/21/2020 0843   K 4.3 05/03/2021 1113   CL 105 05/03/2021 1113   CO2 30 05/03/2021 1113   GLUCOSE 91  05/03/2021 1113   BUN 27 (H) 05/03/2021 1113   BUN 13 04/21/2020 0843   CREATININE 0.90 05/03/2021 1113   CALCIUM 9.4 05/03/2021 1113   PROT 7.3 05/03/2021 1113   ALBUMIN 4.0 05/03/2021 1113   AST 15 05/03/2021 1113   ALT 13 05/03/2021 1113   ALKPHOS 73 05/03/2021 1113   BILITOT 0.6 05/03/2021 1113   GFRNONAA >60 05/03/2021 1113   GFRAA >60 06/25/2014 1948    No results found for: "TOTALPROTELP", "ALBUMINELP", "A1GS", "A2GS", "BETS", "BETA2SER", "GAMS", "MSPIKE", "SPEI"  Lab Results  Component Value Date   WBC 5.6 05/03/2021   NEUTROABS 3.7 05/03/2021   HGB 13.8 05/03/2021   HCT 42.3 05/03/2021   MCV 90.8 05/03/2021   PLT 153 05/03/2021    No results found for: "LABCA2"  No components found for: "VVOHYW737"  No results for input(s): "INR" in the last 168 hours.  No results found for: "LABCA2"  No results found for: "TGG269"  No results found for: "CAN125"  No results found for: "CAN153"  No results found for: "CA2729"  No components found for: "HGQUANT"  No results found for: "CEA1", "CEA" / No results found for: "CEA1", "CEA"   No results found for: "AFPTUMOR"  No results found for: "CHROMOGRNA"  No results found for: "KPAFRELGTCHN", "LAMBDASER", "KAPLAMBRATIO" (kappa/lambda light chains)  No results found for: "HGBA", "HGBA2QUANT", "HGBFQUANT", "HGBSQUAN" (Hemoglobinopathy evaluation)   No results found for: "LDH"  No results found for: "IRON", "TIBC", "IRONPCTSAT" (Iron and TIBC)  No results found for: "FERRITIN"  Urinalysis    Component Value Date/Time   COLORURINE YELLOW 06/25/2014 [place] 06/25/2014 1850   LABSPEC 1.029 06/25/2014 1850   PHURINE 5.5 06/25/2014 1850   GLUCOSEU NEGATIVE 06/25/2014 1850   HGBUR TRACE (A) 06/25/2014 1850   BILIRUBINUR NEGATIVE 06/25/2014 1850   KETONESUR NEGATIVE 06/25/2014 [place] 06/25/2014  1850   UROBILINOGEN 0.2 06/25/2014 1850   NITRITE NEGATIVE 06/25/2014 1850    LEUKOCYTESUR SMALL (A) 06/25/2014 1850    STUDIES: No results found.   ELIGIBLE FOR AVAILABLE RESEARCH PROTOCOL: no  ASSESSMENT: 56 y.o. [facility] woman status post bilateral lumpectomies 02/12/2020, showing  (a) in the right breast, an intraductal papilloma and atypical lobular hyperplasia  (b) in the left breast, a pT1a pNX, stage IA invasive ductal carcinoma, grade 2, with positive margins   (i) the invasive disease was estrogen and progesterone receptor positive, HER-2 not amplified, with an MIB-1 of 2%   (ii) additional surgery 03/04/2020 clear margins   (iii) left axillary sentinel lymph node sampling 03/04/2020 removed 2 lymph nodes both negative  (1) adjuvant radiation completed 04/30/2020  (2) genetics testing 04/02/2020 through the [place] [facility]RNA[facility] Panel found no deleterious mutations in AIP, ALK, APC, ATM, AXIN2, BAP1, BARD1, BLM, BMPR1A, BRCA1, BRCA2, BRIP1, CDC73, CDH1, CDK4, CDKN1B, CDKN2A, CHEK2, CTNNA1, DICER1, FANCC, FH, FLCN, GALNT12, KIF1B, LZTR1, MAX, MEN1, MET, MLH1, MSH2, MSH3, MSH6, MUTYH, NBN, NF1, NF2, NTHL1, PALB2, PHOX2B, PMS2, POT1, PRKAR1A, PTCH1, PTEN, RAD51C, RAD51D, RB1, RECQL, RET, SDHA, SDHAF2, SDHB, SDHC, SDHD, SMAD4, SMARCA4, SMARCB1, SMARCE1, STK11, SUFU, TMEM127, TP53, TSC1, TSC2, VHL and XRCC2 (sequencing and deletion/duplication); EGFR, EGLN1, HOXB13, KIT, MITF, PDGFRA, POLD1, and POLE (sequencing only); EPCAM and GREM1 (deletion/duplication only).   (3) considering antiestrogens   PLAN: She is here for telephone visit after she left a message about considering lower dose of tamoxifen. When I last saw her, she was not on any tamoxifen, according to the documentation from the last note, she tried the tamoxifen for 2 months but when she had terrible discharge, she held it.  During our discussion, we have discussed about trying the 10 mg which she was agreeable to.   I do not recollect complaint of any brown discharge during her last  visit.  She did mention about discharge which was very debilitating for her active lifestyle but I completely agree with gynecology evaluation if she did indeed had brown discharge or bloody discharge.  I have asked her to hold tamoxifen at this time. We will reschedule another telephone visit in 2 weeks to review the gynecology evaluation and recommendations and based on that, we can discuss about further use of tamoxifen. I understand that she is worried about impaired glucose tolerance while on tamoxifen but I am not certain we can attribute this to tamoxifen.  We have discussed about this.   I do not believe her thrombocytopenia is related to tamoxifen.   She is agreeable for a follow-up telephone visit to discuss further recommendations.  Total time spent: 20 minutes  I connected with  Kem Kays on 07/05/21 by a telephone application and verified that I am speaking with the correct person using two identifiers.   I discussed the limitations of evaluation and management by telemedicine. The patient expressed understanding and agreed to proceed.   *Total Encounter Time as defined by the Centers for Medicare and Medicaid Services includes, in addition to the face-to-face time of a patient visit (documented in the note above) non-face-to-face time: obtaining and reviewing outside history, ordering and reviewing medications, tests or procedures, care coordination (communications with other health care professionals or caregivers) and documentation in the medical record.

## 2021-07-07 ENCOUNTER — Telehealth: Payer: Self-pay | Admitting: Hematology and Oncology

## 2021-07-07 NOTE — Telephone Encounter (Signed)
Scheduled appointment per 06/20 los. Left message.

## 2021-07-08 DIAGNOSIS — N939 Abnormal uterine and vaginal bleeding, unspecified: Secondary | ICD-10-CM | POA: Diagnosis not present

## 2021-07-08 DIAGNOSIS — N950 Postmenopausal bleeding: Secondary | ICD-10-CM | POA: Diagnosis not present

## 2021-07-20 ENCOUNTER — Other Ambulatory Visit (HOSPITAL_BASED_OUTPATIENT_CLINIC_OR_DEPARTMENT_OTHER): Payer: Self-pay

## 2021-07-22 ENCOUNTER — Encounter: Payer: Self-pay | Admitting: Hematology and Oncology

## 2021-07-22 ENCOUNTER — Inpatient Hospital Stay: Payer: BC Managed Care – PPO | Attending: Hematology and Oncology | Admitting: Hematology and Oncology

## 2021-07-22 DIAGNOSIS — Z170 Estrogen receptor positive status [ER+]: Secondary | ICD-10-CM

## 2021-07-22 DIAGNOSIS — C50812 Malignant neoplasm of overlapping sites of left female breast: Secondary | ICD-10-CM | POA: Diagnosis not present

## 2021-07-22 NOTE — Progress Notes (Signed)
Cheyenne Garner  [phone] [phone]) [phone]     ID: Cheyenne Garner DOB: 1965-09-22  MR#: [id number]  MLY#:[id number]  Patient Care Team: Mosie Lukes, MD as PCP - General (Family Medicine) Berniece Salines, DO as PCP - Cardiology (Cardiology) Jovita Kussmaul, MD as Consulting Physician (General Surgery) Magrinat, Virgie Dad, MD (Inactive) as Consulting Physician (Oncology) Kyung Rudd, MD as Consulting Physician (Radiation Oncology) Arta Silence, MD as Consulting Physician (Gastroenterology) [place], Jennefer Bravo, MD as Referring Physician (Dermatology) Rockwell Germany, RN as Oncology Nurse Navigator Mauro Kaufmann, RN as Oncology Nurse Navigator Paula Compton, MD as Consulting Physician (Obstetrics and Gynecology) Benay Pike, MD OTHER MD:  CHIEF COMPLAINT: Estrogen receptor positive breast cancer  CURRENT TREATMENT:  None  INTERVAL HISTORY:  Cheyenne Garner returns today for telephone visit for follow-up on endometrial biopsy results.  She mentions that the endometrial biopsy results are thankfully negative but she is very worried about going back on tamoxifen.  She also has an endometrial polyp which needs to be removed and she currently is scheduled in September or will be scheduled in September.  She tells me that she give a good thought about taking tamoxifen and she does not want to proceed with it at this time.  She is willing to continue with mammograms and return to clinic for follow-up as scheduled.  REVIEW OF SYSTEMS:  COVID 19 VACCINATION STATUS: [place] x1, most recently 01/2019   HISTORY OF CURRENT ILLNESS: From the original intake note:  Cheyenne Garner was initially evaluated in 05/2017 for a right nipple/breast mass. Diagnostic mammography showed no evidence of malignancy, and she was subsequently referred to Dr. Marlou Starks for further evaluation. She elected for follow up of the lesion. She was then seen by dermatology, who drained the lesion.  The lesion resolved but began to reoccur 6 months later. She underwent bilateral diagnostic mammography with tomography and right breast ultrasonography at [facility] on 10/21/2019 showing: breast density category C; dilated duct and nipple discharge from right breast; no intraductal mass or mammographic abnormality identified; right axilla negative for adenopathy.  She underwent breast MRI on 11/11/2019 showing: breast composition C; 1.8 cm linear clumped non-mass enhancement within lower right breast, 6 o'clock; 8 mm irregular enhancing mass within upper-outer left breast; benign-appearing 1.5 cm nonenhancing fluid-intensity mass/collection within right nipple with probable subareolar extension, corresponding to clinical area of concern.  Accordingly on 12/04/2019 she proceeded to biopsy of the bilateral breast areas in question. The pathology from this procedure ([id number]) showed:  1. Right Breast  - fibrocystic changes  - focal atypical lobular hyperplasia  --concordant   2. Left Breast  - benign breast tissue --discordant  As noted, the right breast biopsy was found to be concordant, but the left breast biopsy was discordant. She proceeded to bilateral lumpectomies on 02/12/2020 under Dr. Marlou Starks. Pathology from the procedure ([id number]) showed: 1. Right Breast  - fibrocystic change  - columnar cell change  - usual ductal hyperplasia  - intraductal papilloma  - associated microcalcifications 2. 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 Proliferation marker Ki67 at 2%. HER2 equivocal by immunohistochemistry (2+), but negative by fluorescent in situ hybridization with a signals ratio 1.19 and number per cell 1.73.  She  underwent re-excision  of the positive margin, as well as left sentinel lymph node biopsy, on 03/04/2020. Pathology [id number]) showed: no residual carcinoma; benign lymph nodes (0/3).   Cancer Staging  Breast cancer in female [facility]) Staging form: Breast, AJCC 8th Edition - Pathologic stage from 03/09/2020: Stage IA (pT1a, pN0, cM0, G2, ER+, PR+, HER2-) - Signed by Lowella Dell, MD on 03/16/2020 Stage prefix: Initial diagnosis Multigene prognostic tests performed: None Histologic grading system: 3 grade system  The patient's subsequent history is as detailed below.   PAST MEDICAL HISTORY: Past Medical History:  Diagnosis Date   Allergies    Anxiety    Asthma    cough variant asthma developed in last couple of years   Bipolar 1 disorder (HCC)    Breast cancer (HCC) 02/12/2020   left lumpectomy IDC w/radiation no chemo   Cervical cancer screening 09/12/2016   Colon polyp 09/12/2016   Family history of bladder cancer    Family history of breast cancer    Family history of ovarian cancer    Family history of prostate cancer    Hx of colonic polyp 09/12/2016   Colonoscopy 2017 with 1 small polyp, done by Dr Dulce Sellar, repeat colonoscopy in 5 years, 2022   Hypothyroidism    Personal history of radiation therapy    Preventative health care 07/23/2015   Thyroid disease     PAST SURGICAL HISTORY: Past Surgical History:  Procedure Laterality Date   APPENDECTOMY     BREAST LUMPECTOMY WITH RADIOACTIVE SEED LOCALIZATION Bilateral 02/12/2020   Procedure: BILATERAL BREAST LUMPECTOMY WITH RADIOACTIVE SEED LOCALIZATION;  Surgeon: Griselda Miner, MD;  Location: [facility];  Service: General;  Laterality: Bilateral;   RE-EXCISION OF BREAST LUMPECTOMY Left 03/04/2020   Procedure: LEFT BREAST MARGIN REEXCISION;  Surgeon: Griselda Miner, MD;  Location: [facility];  Service: General;  Laterality: Left;   SENTINEL NODE BIOPSY Left 03/04/2020   Procedure: SENTINEL  NODE BIOPSY;  Surgeon: Griselda Miner, MD;  Location: [facility];  Service: General;  Laterality: Left;   SKIN BIOPSY     back moles removed (precancer?)    FAMILY HISTORY: Family History  Problem Relation Age of Onset   Arthritis Mother    Dementia Mother    Hypertension Father    Heart disease Father        MI, s/p triple bypass at age 78   Neuropathy Father        toxic peripheral    Cancer Paternal Grandfather        young, lung cancer?, heavy smoker   Cholelithiasis Sister    Aortic aneurysm Sister 12   Aneurysm Sister        descending aortic    Mental retardation Sister        ADD, schizoaffective d/o   Obesity Sister    Arthritis Brother        back disease   Ovarian cancer Cousin 59       maternal second cousin (MGF's great-niece)   Dementia Maternal Grandmother    Osteoarthritis Maternal Grandmother    Macular degeneration Maternal Grandfather    Osteoarthritis Maternal Grandfather    Congestive Heart Failure Paternal Grandmother    Breast cancer Cousin 89       paternal first cousin   Prostate cancer Paternal Uncle        spread to bladder, d. 69s/70s   Cancer Niece 11       soft tissue sarcoma  From the genetic counselors note (February 2022):  "Ms. Glanzer's mother is alive at age 47 and may have had basal cell carcinoma of her face (unconfirmed). There were three maternal aunts. There is no known cancer among maternal aunts/uncles or maternal first cousins. Ms. Hochstein maternal grandmother died at age 46 without cancer. Her maternal grandfather died at age 36 without cancer. A maternal second cousin (MGF's great-niece) died from ovarian cancer at age 42. A maternal first cousin once removed (MGF's niece) had an unknown type of gynecologic cancer in her 17s.    Ms. Spiewak father is alive at age 17 and was diagnosed with bladder cancer at age 56. There are two paternal aunts and four paternal uncles. Once uncle died in his late 87s or early 22s  with prostate cancer and bladder cancer. One paternal first cousin was diagnosed with breast cancer around age 36. Ms. Kathman paternal grandmother died in her 53s without cancer. Her paternal grandfather died in his 62s from an unknown type of cancer (not colon cancer, possibly lung cancer). A paternal first cousin once removed (PGF's nephew) had lung cancer in his 16s, and died in his 64s with prostate cancer and bladder cancer.  "   GYNECOLOGIC HISTORY:   No LMP recorded. Patient is postmenopausal. Menarche: 57 years old Age at first live birth: 56 years old [place] P 2 LMP age 33 Contraceptive  HRT yes at least 4 years  Hysterectomy? no BSO? 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  Her husband Lynelle Smoke is an Chief Financial Officer.  He takes care of our blood analyzer is here and throughout the system.  Son Kevan Ny started forestry and lives in [place] where he works for [employer] restoration taking care of their trails.  Son Alverda Skeans is 1   ADVANCED DIRECTIVES: In the absence of any documents to the contrary the patient's husband is her healthcare power of attorney   HEALTH MAINTENANCE: Social History   Tobacco Use   Smoking status: Never   Smokeless tobacco: Never  Substance Use Topics   Alcohol use: Yes    Comment: social   Drug use: No     Colonoscopy: 02/2015, Dr. [name], recall 2022  PAP: 08/2016, negative  Bone density: Never done   Allergies  Allergen Reactions   Gentamycin [Gentamicin]     Reacted to eye ointment, swelling red painful eyes    Current Outpatient Medications  Medication Sig Dispense Refill   albuterol (VENTOLIN HFA) 108 (90 Base) MCG/ACT inhaler Inhale 2 puffs into the lungs every 6 (six) hours as needed for wheezing or shortness of breath. 1 Inhaler 2   CALCIUM PO Take by mouth daily. Bone Up     Cholecalciferol (VITAMIN D-3) 125 MCG (5000 UT) TABS Take 1 tablet by mouth daily at 6 (six) AM.      lithium 300 MG tablet Take 300 mg by mouth 2 (two) times daily.     OVER THE COUNTER MEDICATION daily. Iodoral     OVER THE COUNTER MEDICATION daily. ashwagandha     Probiotic Product (PROBIOTIC-10 PO) Take 1 capsule by mouth daily.     tamoxifen (NOLVADEX) 10 MG tablet Take 1 tablet (10 mg total) by mouth daily. 30 tablet 3   thyroid (ARMOUR) 90 MG tablet Take 90 mg by mouth daily.     Zinc 50 MG TABS Take 50 mg by mouth daily.     No current  facility-administered medications for this visit.    OBJECTIVE: White woman in no acute distress  There were no vitals filed for this visit.    There is no height or weight on file to calculate BMI.   Wt Readings from Last 3 Encounters:  06/15/21 147 lb 6.4 oz (66.9 kg)  05/09/21 144 lb 6 oz (65.5 kg)  05/03/21 146 lb 9.6 oz (66.5 kg)      ECOG FS:1 - Symptomatic but completely ambulatory  Physical exam deferred, telephone visit  LAB RESULTS:  CMP     Component Value Date/Time   NA 139 05/03/2021 1113   NA 143 04/21/2020 0843   K 4.3 05/03/2021 1113   CL 105 05/03/2021 1113   CO2 30 05/03/2021 1113   GLUCOSE 91 05/03/2021 1113   BUN 27 (H) 05/03/2021 1113   BUN 13 04/21/2020 0843   CREATININE 0.90 05/03/2021 1113   CALCIUM 9.4 05/03/2021 1113   PROT 7.3 05/03/2021 1113   ALBUMIN 4.0 05/03/2021 1113   AST 15 05/03/2021 1113   ALT 13 05/03/2021 1113   ALKPHOS 73 05/03/2021 1113   BILITOT 0.6 05/03/2021 1113   GFRNONAA >60 05/03/2021 1113   GFRAA >60 06/25/2014 1948    No results found for: "TOTALPROTELP", "ALBUMINELP", "A1GS", "A2GS", "BETS", "BETA2SER", "GAMS", "MSPIKE", "SPEI"  Lab Results  Component Value Date   WBC 5.6 05/03/2021   NEUTROABS 3.7 05/03/2021   HGB 13.8 05/03/2021   HCT 42.3 05/03/2021   MCV 90.8 05/03/2021   PLT 153 05/03/2021    No results found for: "LABCA2"  No components found for: "XTAVWP794"  No results for input(s): "INR" in the last 168 hours.  No results found for: "LABCA2"  No  results found for: "IAX655"  No results found for: "CAN125"  No results found for: "CAN153"  No results found for: "CA2729"  No components found for: "HGQUANT"  No results found for: "CEA1", "CEA" / No results found for: "CEA1", "CEA"   No results found for: "AFPTUMOR"  No results found for: "CHROMOGRNA"  No results found for: "KPAFRELGTCHN", "LAMBDASER", "KAPLAMBRATIO" (kappa/lambda light chains)  No results found for: "HGBA", "HGBA2QUANT", "HGBFQUANT", "HGBSQUAN" (Hemoglobinopathy evaluation)   No results found for: "LDH"  No results found for: "IRON", "TIBC", "IRONPCTSAT" (Iron and TIBC)  No results found for: "FERRITIN"  Urinalysis    Component Value Date/Time   COLORURINE YELLOW 06/25/2014 [place] 06/25/2014 1850   LABSPEC 1.029 06/25/2014 1850   PHURINE 5.5 06/25/2014 1850   GLUCOSEU NEGATIVE 06/25/2014 1850   HGBUR TRACE (A) 06/25/2014 1850   BILIRUBINUR NEGATIVE 06/25/2014 1850   KETONESUR NEGATIVE 06/25/2014 1850   PROTEINUR NEGATIVE 06/25/2014 1850   UROBILINOGEN 0.2 06/25/2014 1850   NITRITE NEGATIVE 06/25/2014 1850   LEUKOCYTESUR SMALL (A) 06/25/2014 1850    STUDIES: No results found.   ELIGIBLE FOR AVAILABLE RESEARCH PROTOCOL: no  ASSESSMENT: 56 y.o. [facility] woman status post bilateral lumpectomies 02/12/2020, showing  (a) in the right breast, an intraductal papilloma and atypical lobular hyperplasia  (b) in the left breast, a pT1a pNX, stage IA invasive ductal carcinoma, grade 2, with positive margins   (i) the invasive disease was estrogen and progesterone receptor positive, HER-2 not amplified, with an MIB-1 of 2%   (ii) additional surgery 03/04/2020 clear margins   (iii) left axillary sentinel lymph node sampling 03/04/2020 removed 2 lymph nodes both negative  (1) adjuvant radiation completed 04/30/2020  (2) genetics testing 04/02/2020 through the [place] +RNAinsight Panel  found no deleterious  mutations in AIP, ALK, APC, ATM, AXIN2, BAP1, BARD1, BLM, BMPR1A, BRCA1, BRCA2, BRIP1, CDC73, CDH1, CDK4, CDKN1B, CDKN2A, CHEK2, CTNNA1, DICER1, FANCC, FH, FLCN, GALNT12, KIF1B, LZTR1, MAX, MEN1, MET, MLH1, MSH2, MSH3, MSH6, MUTYH, NBN, NF1, NF2, NTHL1, PALB2, PHOX2B, PMS2, POT1, PRKAR1A, PTCH1, PTEN, RAD51C, RAD51D, RB1, RECQL, RET, SDHA, SDHAF2, SDHB, SDHC, SDHD, SMAD4, SMARCA4, SMARCB1, SMARCE1, STK11, SUFU, TMEM127, TP53, TSC1, TSC2, VHL and XRCC2 (sequencing and deletion/duplication); EGFR, EGLN1, HOXB13, KIT, MITF, PDGFRA, POLD1, and POLE (sequencing only); EPCAM and GREM1 (deletion/duplication only).   (3) declined antiestrogen therapy at this time  PLAN: She is here for telephone visit.  Since last visit, she had endometrial biopsy which was unremarkable according to the patient but she also has an endometrial polyp which will be removed in the next couple months.  She gave her a lot of thought about taking tamoxifen.  She understands the benefits and risks but she decided not to proceed with adjuvant tamoxifen.  I have once again clearly explained the benefit of tamoxifen including reduced risk of distant recurrence as well as local recurrence.  She however thinks feels comfortable with omitting antiestrogen since she had an early stage breast cancer and since she worries about the toxicity from tamoxifen including endometrial hyperplasia, thrombocytopenia etc.    She we will proceed with mammogram as scheduled in October and will return to clinic for follow-up with [place] in October.  She understands that she can call us sooner with any new questions or concerns. Total time spent: 20 minutes  I connected with  Kem Kays on 07/22/21 by a telephone application and verified that I am speaking with the correct person using two identifiers.   I discussed the limitations of evaluation and management by telemedicine. The patient expressed understanding and agreed to proceed.   *Total Encounter  Time as defined by the Centers for Medicare and Medicaid Services includes, in addition to the face-to-face time of a patient visit (documented in the note above) non-face-to-face time: obtaining and reviewing outside history, ordering and reviewing medications, tests or procedures, care coordination (communications with other health care professionals or caregivers) and documentation in the medical record.

## 2021-08-04 DIAGNOSIS — F3111 Bipolar disorder, current episode manic without psychotic features, mild: Secondary | ICD-10-CM | POA: Diagnosis not present

## 2021-08-18 ENCOUNTER — Telehealth: Payer: Self-pay | Admitting: Hematology and Oncology

## 2021-08-18 NOTE — Telephone Encounter (Signed)
Contacted patient to scheduled appointments. Left message with appointment details and a call back number if patient had any questions or could not accommodate the time we provided.   

## 2021-09-12 ENCOUNTER — Ambulatory Visit: Payer: BC Managed Care – PPO | Attending: General Surgery

## 2021-09-12 VITALS — Wt 151.0 lb

## 2021-09-12 DIAGNOSIS — Z483 Aftercare following surgery for neoplasm: Secondary | ICD-10-CM | POA: Insufficient documentation

## 2021-09-12 NOTE — Therapy (Signed)
OUTPATIENT PHYSICAL THERAPY SOZO SCREENING NOTE   Patient Name: Cheyenne Garner MRN: [id number] DOB:[birth year], 56 y.o., female Today's Date: 09/12/2021  PCP: Mosie Lukes, MD REFERRING PROVIDER: Mosie Lukes, MD   PT End of Session - 09/12/21 1000     Visit Number 14   # unchanged due to screen only   PT Start Time 0958    PT Stop Time 1006    PT Time Calculation (min) 8 min    Activity Tolerance Patient tolerated treatment well    Behavior During Therapy [facility] for tasks assessed/performed             Past Medical History:  Diagnosis Date   Allergies    Anxiety    Asthma    cough variant asthma developed in last couple of years   Bipolar 1 disorder ([place])    Breast cancer ([place]) 02/12/2020   left lumpectomy IDC w/radiation no chemo   Cervical cancer screening 09/12/2016   Colon polyp 09/12/2016   Family history of bladder cancer    Family history of breast cancer    Family history of ovarian cancer    Family history of prostate cancer    Hx of colonic polyp 09/12/2016   Colonoscopy 2017 with 1 small polyp, done by Dr [name], repeat colonoscopy in 5 years, 2022   Hypothyroidism    Personal history of radiation therapy    Preventative health care 07/23/2015   Thyroid disease    Past Surgical History:  Procedure Laterality Date   APPENDECTOMY     BREAST LUMPECTOMY WITH RADIOACTIVE SEED LOCALIZATION Bilateral 02/12/2020   Procedure: BILATERAL BREAST LUMPECTOMY WITH RADIOACTIVE SEED LOCALIZATION;  Surgeon: Jovita Kussmaul, MD;  Location: [place];  Service: General;  Laterality: Bilateral;   RE-EXCISION OF BREAST LUMPECTOMY Left 03/04/2020   Procedure: LEFT BREAST MARGIN REEXCISION;  Surgeon: Jovita Kussmaul, MD;  Location: [place];  Service: General;  Laterality: Left;   SENTINEL NODE BIOPSY Left 03/04/2020   Procedure: SENTINEL NODE BIOPSY;  Surgeon: Jovita Kussmaul, MD;  Location: [place];  Service: General;   Laterality: Left;   SKIN BIOPSY     back moles removed (precancer?)   Patient Active Problem List   Diagnosis Date Noted   Hyperglycemia 11/08/2020   Elevated sed rate 11/08/2020   Left upper lobe pneumonia 08/15/2020   [place] 07/14/2020   Malignant neoplasm of overlapping sites of left breast in female, estrogen receptor positive ([place]) 05/18/2020   Genetic testing 04/07/2020   Hypothyroidism    Anxiety    Allergies    FH: thoracic aortic aneurysm 03/23/2020   Family history of breast cancer    Family history of ovarian cancer    Family history of bladder cancer    Family history of prostate cancer    Breast cancer in female ([place]) 03/09/2020   Bilateral dry eyes 01/27/2019   Cervical cancer screening 09/12/2016   Hx of colonic polyp 09/12/2016   Colon polyp 09/12/2016   Preventative health care 07/23/2015   Asthma    Thyroid disease    Bipolar affective ([place])    Urinary incontinence 12/25/2011    REFERRING DIAG: left breast cancer at risk for lymphedema  THERAPY DIAG: Aftercare following surgery for neoplasm  PERTINENT HISTORY: L breast cancer, s/p bilateral breast lumpectomies 02/12/20, R was benign and L was discovered to have DCIS and IDC ER/PR+, HER 2-, Ki67- 2%, pt will under reexcision and SLNB on L  on 03/04/20- unknown if pt will require chemo and radiation   PRECAUTIONS: left UE Lymphedema risk, None  SUBJECTIVE: Pt returns for her 3 month L-Dex screen.   PAIN:  Are you having pain? No  SOZO SCREENING: Patient was assessed today using the SOZO machine to determine the lymphedema index score. This was compared to her baseline score. It was determined that she is within the recommended range when compared to her baseline and no further action is needed at this time. She will continue SOZO screenings. These are done every 3 months for 2 years post operatively followed by every 6 months for 2 years, and then annually.   L-DEX FLOWSHEETS - 09/12/21 1000       L-DEX  LYMPHEDEMA SCREENING   Measurement Type Unilateral    L-DEX MEASUREMENT EXTREMITY Upper Extremity    POSITION  Standing    DOMINANT SIDE Left    At Risk Side Left    BASELINE SCORE (UNILATERAL) -1.7    L-DEX SCORE (UNILATERAL) -1.3    VALUE CHANGE (UNILAT) 0.4              Otelia Limes, PTA 09/12/2021, 10:07 AM

## 2021-09-20 ENCOUNTER — Other Ambulatory Visit (HOSPITAL_BASED_OUTPATIENT_CLINIC_OR_DEPARTMENT_OTHER): Payer: Self-pay

## 2021-09-20 MED ORDER — THYROID 90 MG PO TABS
90.0000 mg | ORAL_TABLET | Freq: Every day | ORAL | 4 refills | Status: DC
  Filled 2021-09-20 – 2021-10-10 (×2): qty 30, 30d supply, fill #0
  Filled 2021-11-14: qty 30, 30d supply, fill #1

## 2021-09-21 ENCOUNTER — Other Ambulatory Visit (HOSPITAL_BASED_OUTPATIENT_CLINIC_OR_DEPARTMENT_OTHER): Payer: Self-pay

## 2021-09-27 ENCOUNTER — Telehealth: Payer: Self-pay

## 2021-09-27 ENCOUNTER — Telehealth: Payer: BC Managed Care – PPO | Admitting: Family Medicine

## 2021-09-27 VITALS — BP 110/72 | HR 76

## 2021-09-27 DIAGNOSIS — C50812 Malignant neoplasm of overlapping sites of left female breast: Secondary | ICD-10-CM

## 2021-09-27 DIAGNOSIS — E079 Disorder of thyroid, unspecified: Secondary | ICD-10-CM | POA: Diagnosis not present

## 2021-09-27 DIAGNOSIS — F319 Bipolar disorder, unspecified: Secondary | ICD-10-CM

## 2021-09-27 DIAGNOSIS — R739 Hyperglycemia, unspecified: Secondary | ICD-10-CM

## 2021-09-27 DIAGNOSIS — Z170 Estrogen receptor positive status [ER+]: Secondary | ICD-10-CM

## 2021-09-27 NOTE — Assessment & Plan Note (Signed)
Stable on Lamictal

## 2021-09-27 NOTE — Telephone Encounter (Signed)
Called pt made appt for Cpe  For 6 Months

## 2021-09-27 NOTE — Progress Notes (Signed)
  MyChart Video Visit    Virtual Visit via Video Note   This visit type was conducted due to national recommendations for restrictions regarding the COVID-19 Pandemic (e.g. social distancing) in an effort to limit this patient's exposure and mitigate transmission in our community. This patient is at least at moderate risk for complications without adequate follow up. This format is felt to be most appropriate for this patient at this time. Physical exam was limited by quality of the video and audio technology used for the visit. Shamaine, CMA was able to get the patient set up on a video visit.  Patient location: home Patient and provider in visit Provider location: Office  I discussed the limitations of evaluation and management by telemedicine and the availability of in person appointments. The patient expressed understanding and agreed to proceed.  Visit Date: 09/27/2021  Today's healthcare provider: Stacey Blyth, MD     Subjective:    Patient ID: Cheyenne Garner, female    DOB: 12/25/1965, 56 y.o.   MRN: [id number]  Chief Complaint  Patient presents with   Follow-up    Follow up     HPI Patient is in today for follow up on chronic medical concerns. No recent febrile illness or acute hospitalizations. She is following with oncology for her breast cancer treatments but she chose to stop the Tamoxifen due to sugar concerns and some post menopausal spotting. She has had an endometrial biopsy with Dr Richardson and it was negative for endometrial cancer but they did find a polyp. She is going in next week to have that removed. Denies CP/palp/SOB/HA/congestion/fevers/GI or GU c/o. Taking meds as prescribed   Past Medical History:  Diagnosis Date   Allergies    Anxiety    Asthma    cough variant asthma developed in last couple of years   Bipolar 1 disorder (HCC)    Breast cancer (HCC) 02/12/2020   left lumpectomy IDC w/radiation no chemo   Cervical cancer screening 09/12/2016    Colon polyp 09/12/2016   Family history of bladder cancer    Family history of breast cancer    Family history of ovarian cancer    Family history of prostate cancer    Hx of colonic polyp 09/12/2016   Colonoscopy 2017 with 1 small polyp, done by Dr Outlaw, repeat colonoscopy in 5 years, 2022   Hypothyroidism    Personal history of radiation therapy    Preventative health care 07/23/2015   Thyroid disease     Past Surgical History:  Procedure Laterality Date   APPENDECTOMY     BREAST LUMPECTOMY WITH RADIOACTIVE SEED LOCALIZATION Bilateral 02/12/2020   Procedure: BILATERAL BREAST LUMPECTOMY WITH RADIOACTIVE SEED LOCALIZATION;  Surgeon: Toth, Paul III, MD;  Location: [facility];  Service: General;  Laterality: Bilateral;   RE-EXCISION OF BREAST LUMPECTOMY Left 03/04/2020   Procedure: LEFT BREAST MARGIN REEXCISION;  Surgeon: Toth, Paul III, MD;  Location: [facility];  Service: General;  Laterality: Left;   SENTINEL NODE BIOPSY Left 03/04/2020   Procedure: SENTINEL NODE BIOPSY;  Surgeon: Toth, Paul III, MD;  Location: [facility];  Service: General;  Laterality: Left;   SKIN BIOPSY     back moles removed (precancer?)    Family History  Problem Relation Age of Onset   Arthritis Mother    Dementia Mother    Hypertension Father    Heart disease Father        MI, s/p triple bypass at   age 60   Neuropathy Father        toxic peripheral    Cancer Paternal Grandfather        young, lung cancer?, heavy smoker   Cholelithiasis Sister    Aortic aneurysm Sister 53   Aneurysm Sister        descending aortic    Mental retardation Sister        ADD, schizoaffective d/o   Obesity Sister    Arthritis Brother        back disease   Ovarian cancer Cousin 38       maternal second cousin (MGF's great-niece)   Dementia Maternal Grandmother    Osteoarthritis Maternal Grandmother    Macular degeneration Maternal Grandfather    Osteoarthritis Maternal  Grandfather    Congestive Heart Failure Paternal Grandmother    Breast cancer Cousin 44       paternal first cousin   Prostate cancer Paternal Uncle        spread to bladder, d. 60s/70s   Cancer Niece 11       soft tissue sarcoma    Social History   Socioeconomic History   Marital status: Married    Spouse name: Not on file   Number of children: Not on file   Years of education: Not on file   Highest education level: Not on file  Occupational History   Not on file  Tobacco Use   Smoking status: Never   Smokeless tobacco: Never  Substance and Sexual Activity   Alcohol use: Yes    Comment: social   Drug use: No   Sexual activity: Yes    Birth control/protection: None, Post-menopausal  Other Topics Concern   Not on file  Social History Narrative   Lives with husband, works at [employer] as a nurse. No major dietary restrictions   Social Determinants of Health   Financial Resource Strain: Not on file  Food Insecurity: Not on file  Transportation Needs: Not on file  Physical Activity: Not on file  Stress: Not on file  Social Connections: Not on file  Intimate Partner Violence: Not on file    Outpatient Medications Prior to Visit  Medication Sig Dispense Refill   albuterol (VENTOLIN HFA) 108 (90 Base) MCG/ACT inhaler Inhale 2 puffs into the lungs every 6 (six) hours as needed for wheezing or shortness of breath. 1 Inhaler 2   CALCIUM PO Take by mouth daily. Bone Up     Cholecalciferol (VITAMIN D-3) 125 MCG (5000 UT) TABS Take 1 tablet by mouth daily at 6 (six) AM.     lithium 300 MG tablet Take 300 mg by mouth 2 (two) times daily.     OVER THE COUNTER MEDICATION daily. Iodoral     OVER THE COUNTER MEDICATION daily. ashwagandha     Probiotic Product (PROBIOTIC-10 PO) Take 1 capsule by mouth daily.     tamoxifen (NOLVADEX) 10 MG tablet Take 1 tablet (10 mg total) by mouth daily. 30 tablet 3   thyroid (ARMOUR) 90 MG tablet Take 90 mg by mouth daily.      thyroid (ARMOUR) 90 MG tablet Take 1 tablet (90 mg total) by mouth daily. 30 tablet 4   Zinc 50 MG TABS Take 50 mg by mouth daily.     No facility-administered medications prior to visit.    Allergies  Allergen Reactions   Gentamycin [Gentamicin]     Reacted to eye ointment, swelling red painful eyes      Review of Systems  Constitutional:  Negative for fever and malaise/fatigue.  HENT:  Negative for congestion.   Eyes:  Negative for blurred vision.  Respiratory:  Negative for shortness of breath.   Cardiovascular:  Negative for chest pain, palpitations and leg swelling.  Gastrointestinal:  Negative for abdominal pain, blood in stool and nausea.  Genitourinary:  Negative for dysuria and frequency.  Musculoskeletal:  Negative for falls.  Skin:  Negative for rash.  Neurological:  Negative for dizziness, loss of consciousness and headaches.  Endo/Heme/Allergies:  Negative for environmental allergies.  Psychiatric/Behavioral:  Negative for depression. The patient is not nervous/anxious.        Objective:    Physical Exam Constitutional:      General: She is not in acute distress.    Appearance: Normal appearance. She is not ill-appearing or toxic-appearing.  HENT:     Head: Normocephalic and atraumatic.     Right Ear: External ear normal.     Left Ear: External ear normal.     Nose: Nose normal.  Eyes:     General:        Right eye: No discharge.        Left eye: No discharge.  Pulmonary:     Effort: Pulmonary effort is normal.  Skin:    Findings: No rash.  Neurological:     Mental Status: She is alert and oriented to person, place, and time.  Psychiatric:        Behavior: Behavior normal.     BP 110/72   Pulse 76   SpO2 98%  Wt Readings from Last 3 Encounters:  09/12/21 151 lb (68.5 kg)  06/15/21 147 lb 6.4 oz (66.9 kg)  05/09/21 144 lb 6 oz (65.5 kg)    Diabetic Foot Exam - Simple   No data filed    Lab Results  Component Value Date   WBC 5.6  05/03/2021   HGB 13.8 05/03/2021   HCT 42.3 05/03/2021   PLT 153 05/03/2021   GLUCOSE 91 05/03/2021   CHOL 163 03/24/2021   TRIG 73.0 03/24/2021   HDL 71.70 03/24/2021   LDLCALC 76 03/24/2021   ALT 13 05/03/2021   AST 15 05/03/2021   NA 139 05/03/2021   K 4.3 05/03/2021   CL 105 05/03/2021   CREATININE 0.90 05/03/2021   BUN 27 (H) 05/03/2021   CO2 30 05/03/2021   TSH 1.50 03/24/2021   HGBA1C 4.9 11/08/2020    Lab Results  Component Value Date   TSH 1.50 03/24/2021   Lab Results  Component Value Date   WBC 5.6 05/03/2021   HGB 13.8 05/03/2021   HCT 42.3 05/03/2021   MCV 90.8 05/03/2021   PLT 153 05/03/2021   Lab Results  Component Value Date   NA 139 05/03/2021   K 4.3 05/03/2021   CO2 30 05/03/2021   GLUCOSE 91 05/03/2021   BUN 27 (H) 05/03/2021   CREATININE 0.90 05/03/2021   BILITOT 0.6 05/03/2021   ALKPHOS 73 05/03/2021   AST 15 05/03/2021   ALT 13 05/03/2021   PROT 7.3 05/03/2021   ALBUMIN 4.0 05/03/2021   CALCIUM 9.4 05/03/2021   ANIONGAP 4 (L) 05/03/2021   EGFR 73 04/21/2020   GFR 72.65 03/24/2021   Lab Results  Component Value Date   CHOL 163 03/24/2021   Lab Results  Component Value Date   HDL 71.70 03/24/2021   Lab Results  Component Value Date   LDLCALC 76 03/24/2021   Lab Results    Component Value Date   TRIG 73.0 03/24/2021   Lab Results  Component Value Date   CHOLHDL 2 03/24/2021   Lab Results  Component Value Date   HGBA1C 4.9 11/08/2020       Assessment & Plan:   Problem List Items Addressed This Visit     Thyroid disease    Is being followed by endorcrinology and has labs ordered soon      Bipolar affective ([place])    Stable on Lamictal      Malignant neoplasm of overlapping sites of left breast in female, estrogen receptor positive ([place])    Has chosen to stop the Tamoxifen and follow closely with oncology      Hyperglycemia - Primary    Repeat CMP, hgba1c and insulin levels. Was on Tamoxifen for her breast  cancer but chose to stop, wants to know if that might have affected her insulin resistance.       Relevant Orders   Hemoglobin A1c   Comprehensive metabolic panel   Insulin, random    I am having Cheyenne Garner maintain her lithium, Probiotic Product (PROBIOTIC-10 PO), albuterol, Vitamin D-3, Zinc, thyroid, tamoxifen, CALCIUM PO, OVER THE COUNTER MEDICATION, OVER THE COUNTER MEDICATION, and thyroid.  No orders of the defined types were placed in this encounter.   I discussed the assessment and treatment plan with the patient. The patient was provided an opportunity to ask questions and all were answered. The patient agreed with the plan and demonstrated an understanding of the instructions.   The patient was advised to call back or seek an in-person evaluation if the symptoms worsen or if the condition fails to improve as anticipated.  Cheyenne Homans, MD [facility] at [facility] [phone] (phone) [phone] (fax)  [place]

## 2021-09-27 NOTE — Assessment & Plan Note (Signed)
Has chosen to stop the Tamoxifen and follow closely with oncology

## 2021-09-27 NOTE — Assessment & Plan Note (Signed)
Repeat CMP, hgba1c and insulin levels. Was on Tamoxifen for her breast cancer but chose to stop, wants to know if that might have affected her insulin resistance.

## 2021-09-27 NOTE — Assessment & Plan Note (Signed)
Is being followed by endorcrinology and has labs ordered soon

## 2021-09-28 ENCOUNTER — Other Ambulatory Visit (INDEPENDENT_AMBULATORY_CARE_PROVIDER_SITE_OTHER): Payer: BC Managed Care – PPO

## 2021-09-28 DIAGNOSIS — R739 Hyperglycemia, unspecified: Secondary | ICD-10-CM

## 2021-09-28 LAB — COMPREHENSIVE METABOLIC PANEL
ALT: 12 U/L (ref 0–35)
AST: 13 U/L (ref 0–37)
Albumin: 4.2 g/dL (ref 3.5–5.2)
Alkaline Phosphatase: 55 U/L (ref 39–117)
BUN: 19 mg/dL (ref 6–23)
CO2: 31 mEq/L (ref 19–32)
Calcium: 9.8 mg/dL (ref 8.4–10.5)
Chloride: 104 mEq/L (ref 96–112)
Creatinine, Ser: 0.94 mg/dL (ref 0.40–1.20)
GFR: 67.79 mL/min (ref >60.00)
Glucose, Bld: 86 mg/dL (ref 70–99)
Potassium: 4.7 mEq/L (ref 3.5–5.1)
Sodium: 141 mEq/L (ref 135–145)
Total Bilirubin: 0.7 mg/dL (ref 0.2–1.2)
Total Protein: 7 g/dL (ref 6.0–8.3)

## 2021-09-28 LAB — HEMOGLOBIN A1C: Hgb A1c MFr Bld: 5.1 % (ref 4.6–6.5)

## 2021-09-28 NOTE — Addendum Note (Signed)
Addended by: Kelle Darting A on: 09/28/2021 10:14 AM   Modules accepted: Orders

## 2021-09-29 ENCOUNTER — Telehealth: Payer: Self-pay

## 2021-09-29 LAB — INSULIN, RANDOM: INSULIN: 7.7 u[IU]/mL (ref 2.6–24.9)

## 2021-09-29 NOTE — Telephone Encounter (Signed)
[facility] has been notified of clearance.

## 2021-09-29 NOTE — Telephone Encounter (Signed)
   Patient Name: Cheyenne Garner  DOB: 01/15/1966 MRN: [id number]  Primary Cardiologist: Berniece Salines, DO  Chart reviewed as part of pre-operative protocol coverage. Per MD note 05/2021, she had left breast cancer diagnosed in February 2022 status post surgery and radiation, Hashimoto syndrome, bipolar disorder, anxiety, asthma and aortic dilation. 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 While not technically aneurysmal by size criteria (less than 4 cm), this does appear enlarged relative to the patient's body size and therefore imaging surveillance is recommended. Recommend follow-up CT a chest in 1-2 years," Dr. Harriet Masson recommended repeat CTA at last visit but patient did not have this done (intake below states patient was afraid of too much radiation and this is why they are requesting clearance). Otherwise had prior reassuring echo 03/2021 and no hx of CAD noted. We are asked to provide an answer by 5pm today. Given urgent nature and non-completed testing I reached out to Dr. Harriet Masson who relayed that the patient can be cleared for her procedure. I will route this message via epic fax to requesting provider. Will also route to callback team who spoke with GYN office to make them aware in case they planned to call the office back. Please call with questions.   Charlie Pitter, PA-C 09/29/2021, 4:15 PM

## 2021-09-29 NOTE — Telephone Encounter (Signed)
   Pre-operative Risk Assessment    Patient Name: Cheyenne Garner  DOB: [birth year] MRN: [id number]      Request for Surgical Clearance    Procedure:   Hysteroscopy D&C Myosure  Date of Surgery:  Clearance 10/03/21                                 Surgeon:  Paula Compton, MD Surgeon's Group or Practice Name:  [facility]. Phone number:  [phone] Fax number:  [phone]   Type of Clearance Requested:   - Medical    Type of Anesthesia:   IV Sedation   Additional requests/questions:   PT DIDN'T HAVE CT SCAN DONE THIS YEAR DUE TO HER BEING AFRAID OF TOO MUCH RADIATION. THIS IS WHY THEY ARE REQUESTING CLEARANCE  Signed, Tayjon Halladay   09/29/2021, 3:58 PM

## 2021-09-30 ENCOUNTER — Other Ambulatory Visit (HOSPITAL_BASED_OUTPATIENT_CLINIC_OR_DEPARTMENT_OTHER): Payer: Self-pay

## 2021-10-03 DIAGNOSIS — N950 Postmenopausal bleeding: Secondary | ICD-10-CM | POA: Diagnosis not present

## 2021-10-10 ENCOUNTER — Other Ambulatory Visit (HOSPITAL_BASED_OUTPATIENT_CLINIC_OR_DEPARTMENT_OTHER): Payer: Self-pay

## 2021-10-18 ENCOUNTER — Other Ambulatory Visit: Payer: Self-pay

## 2021-10-19 DIAGNOSIS — H3562 Retinal hemorrhage, left eye: Secondary | ICD-10-CM | POA: Diagnosis not present

## 2021-10-19 DIAGNOSIS — H2513 Age-related nuclear cataract, bilateral: Secondary | ICD-10-CM | POA: Diagnosis not present

## 2021-10-24 ENCOUNTER — Encounter: Payer: Self-pay | Admitting: Family Medicine

## 2021-11-03 ENCOUNTER — Ambulatory Visit: Payer: BC Managed Care – PPO | Admitting: Hematology and Oncology

## 2021-11-03 ENCOUNTER — Ambulatory Visit
Admission: RE | Admit: 2021-11-03 | Discharge: 2021-11-03 | Disposition: A | Discharge status: Home / Self Care | Payer: BC Managed Care – PPO | Source: Ambulatory Visit | Attending: Hematology and Oncology | Admitting: Hematology and Oncology

## 2021-11-03 ENCOUNTER — Other Ambulatory Visit: Payer: BC Managed Care – PPO

## 2021-11-03 DIAGNOSIS — Z170 Estrogen receptor positive status [ER+]: Secondary | ICD-10-CM

## 2021-11-03 DIAGNOSIS — Z853 Personal history of malignant neoplasm of breast: Secondary | ICD-10-CM | POA: Diagnosis not present

## 2021-11-03 DIAGNOSIS — R92333 Mammographic heterogeneous density, bilateral breasts: Secondary | ICD-10-CM | POA: Diagnosis not present

## 2021-11-14 ENCOUNTER — Other Ambulatory Visit (HOSPITAL_BASED_OUTPATIENT_CLINIC_OR_DEPARTMENT_OTHER): Payer: Self-pay

## 2021-11-15 ENCOUNTER — Other Ambulatory Visit: Payer: BC Managed Care – PPO

## 2021-11-15 ENCOUNTER — Ambulatory Visit: Payer: BC Managed Care – PPO | Admitting: Hematology and Oncology

## 2021-11-18 ENCOUNTER — Other Ambulatory Visit (HOSPITAL_BASED_OUTPATIENT_CLINIC_OR_DEPARTMENT_OTHER): Payer: Self-pay

## 2021-11-18 ENCOUNTER — Other Ambulatory Visit: Payer: Self-pay | Admitting: Hematology and Oncology

## 2021-11-25 ENCOUNTER — Other Ambulatory Visit: Payer: Self-pay

## 2021-11-25 DIAGNOSIS — Z170 Estrogen receptor positive status [ER+]: Secondary | ICD-10-CM

## 2021-11-28 ENCOUNTER — Inpatient Hospital Stay (HOSPITAL_BASED_OUTPATIENT_CLINIC_OR_DEPARTMENT_OTHER): Payer: BC Managed Care – PPO | Admitting: Hematology and Oncology

## 2021-11-28 ENCOUNTER — Inpatient Hospital Stay: Payer: BC Managed Care – PPO | Attending: Hematology and Oncology

## 2021-11-28 ENCOUNTER — Ambulatory Visit: Payer: BC Managed Care – PPO | Attending: General Surgery

## 2021-11-28 ENCOUNTER — Other Ambulatory Visit: Payer: Self-pay

## 2021-11-28 VITALS — Wt 151.2 lb

## 2021-11-28 VITALS — BP 132/85 | HR 79 | Temp 98.1°F | Resp 16 | Ht 66.0 in | Wt 152.9 lb

## 2021-11-28 DIAGNOSIS — Z8041 Family history of malignant neoplasm of ovary: Secondary | ICD-10-CM | POA: Diagnosis not present

## 2021-11-28 DIAGNOSIS — Z170 Estrogen receptor positive status [ER+]: Secondary | ICD-10-CM | POA: Diagnosis not present

## 2021-11-28 DIAGNOSIS — C50812 Malignant neoplasm of overlapping sites of left female breast: Secondary | ICD-10-CM

## 2021-11-28 DIAGNOSIS — Z803 Family history of malignant neoplasm of breast: Secondary | ICD-10-CM | POA: Diagnosis not present

## 2021-11-28 DIAGNOSIS — Z8052 Family history of malignant neoplasm of bladder: Secondary | ICD-10-CM | POA: Insufficient documentation

## 2021-11-28 DIAGNOSIS — Z79810 Long term (current) use of selective estrogen receptor modulators (SERMs): Secondary | ICD-10-CM | POA: Insufficient documentation

## 2021-11-28 DIAGNOSIS — Z483 Aftercare following surgery for neoplasm: Secondary | ICD-10-CM | POA: Insufficient documentation

## 2021-11-28 DIAGNOSIS — C50912 Malignant neoplasm of unspecified site of left female breast: Secondary | ICD-10-CM | POA: Diagnosis not present

## 2021-11-28 DIAGNOSIS — Z923 Personal history of irradiation: Secondary | ICD-10-CM | POA: Diagnosis not present

## 2021-11-28 DIAGNOSIS — Z8042 Family history of malignant neoplasm of prostate: Secondary | ICD-10-CM | POA: Insufficient documentation

## 2021-11-28 LAB — CMP (CANCER CENTER ONLY)
ALT: 15 U/L (ref 0–44)
AST: 16 U/L (ref 15–41)
Albumin: 4.4 g/dL (ref 3.5–5.0)
Alkaline Phosphatase: 59 U/L (ref 38–126)
Anion gap: 4 — ABNORMAL LOW (ref 5–15)
BUN: 26 mg/dL — ABNORMAL HIGH (ref 6–20)
CO2: 31 mmol/L (ref 22–32)
Calcium: 9.7 mg/dL (ref 8.9–10.3)
Chloride: 103 mmol/L (ref 98–111)
Creatinine: 1.16 mg/dL — ABNORMAL HIGH (ref 0.44–1.00)
GFR, Estimated: 55 mL/min — ABNORMAL LOW (ref >60)
Glucose, Bld: 89 mg/dL (ref 70–99)
Potassium: 4.6 mmol/L (ref 3.5–5.1)
Sodium: 138 mmol/L (ref 135–145)
Total Bilirubin: 0.6 mg/dL (ref 0.3–1.2)
Total Protein: 7.2 g/dL (ref 6.5–8.1)

## 2021-11-28 LAB — CBC WITH DIFFERENTIAL (CANCER CENTER ONLY)
Abs Immature Granulocytes: 0.01 10*3/uL (ref 0.00–0.07)
Basophils Absolute: 0 10*3/uL (ref 0.0–0.1)
Basophils Relative: 1 %
Eosinophils Absolute: 0.1 10*3/uL (ref 0.0–0.5)
Eosinophils Relative: 2 %
HCT: 41 % (ref 36.0–46.0)
Hemoglobin: 14 g/dL (ref 12.0–15.0)
Immature Granulocytes: 0 %
Lymphocytes Relative: 27 %
Lymphs Abs: 1.4 10*3/uL (ref 0.7–4.0)
MCH: 31.4 pg (ref 26.0–34.0)
MCHC: 34.1 g/dL (ref 30.0–36.0)
MCV: 91.9 fL (ref 80.0–100.0)
Monocytes Absolute: 0.4 10*3/uL (ref 0.1–1.0)
Monocytes Relative: 9 %
Neutro Abs: 3.1 10*3/uL (ref 1.7–7.7)
Neutrophils Relative %: 61 %
Platelet Count: 139 10*3/uL — ABNORMAL LOW (ref 150–400)
RBC: 4.46 MIL/uL (ref 3.87–5.11)
RDW: 12.3 % (ref 11.5–15.5)
WBC Count: 5.1 10*3/uL (ref 4.0–10.5)
nRBC: 0 % (ref 0.0–0.2)

## 2021-11-28 MED ORDER — TAMOXIFEN CITRATE 10 MG PO TABS: 10.0000 mg | ORAL_TABLET | Freq: Every day | ORAL | 1 refills | Status: DC

## 2021-11-28 NOTE — Therapy (Signed)
OUTPATIENT PHYSICAL THERAPY SOZO SCREENING NOTE   Patient Name: Cheyenne Garner MRN: [id number] DOB:[birth year], 56 y.o., female Today's Date: 11/28/2021  PCP: Mosie Lukes, MD REFERRING PROVIDER: Mosie Lukes, MD   PT End of Session - 11/28/21 1535     Visit Number 14   # unchanged due to screen only   PT Start Time 1530    PT Stop Time 1537    PT Time Calculation (min) 7 min    Activity Tolerance Patient tolerated treatment well    Behavior During Therapy WFL for tasks assessed/performed             Past Medical History:  Diagnosis Date   Allergies    Anxiety    Asthma    cough variant asthma developed in last couple of years   Bipolar 1 disorder ([place])    Breast cancer ([place]) 02/12/2020   left lumpectomy IDC w/radiation no chemo   Cervical cancer screening 09/12/2016   Colon polyp 09/12/2016   Family history of bladder cancer    Family history of breast cancer    Family history of ovarian cancer    Family history of prostate cancer    Hx of colonic polyp 09/12/2016   Colonoscopy 2017 with 1 small polyp, done by Dr [name], repeat colonoscopy in 5 years, 2022   Hypothyroidism    Personal history of radiation therapy    Preventative health care 07/23/2015   Thyroid disease    Past Surgical History:  Procedure Laterality Date   APPENDECTOMY     BREAST LUMPECTOMY WITH RADIOACTIVE SEED LOCALIZATION Bilateral 02/12/2020   Procedure: BILATERAL BREAST LUMPECTOMY WITH RADIOACTIVE SEED LOCALIZATION;  Surgeon: Jovita Kussmaul, MD;  Location: [place];  Service: General;  Laterality: Bilateral;   RE-EXCISION OF BREAST LUMPECTOMY Left 03/04/2020   Procedure: LEFT BREAST MARGIN REEXCISION;  Surgeon: Jovita Kussmaul, MD;  Location: [place];  Service: General;  Laterality: Left;   SENTINEL NODE BIOPSY Left 03/04/2020   Procedure: SENTINEL NODE BIOPSY;  Surgeon: Jovita Kussmaul, MD;  Location: [place];  Service: General;   Laterality: Left;   SKIN BIOPSY     back moles removed (precancer?)   Patient Active Problem List   Diagnosis Date Noted   Hyperglycemia 11/08/2020   Elevated sed rate 11/08/2020   [place] 07/14/2020   Malignant neoplasm of overlapping sites of left breast in female, estrogen receptor positive ([place]) 05/18/2020   Genetic testing 04/07/2020   Hypothyroidism    Anxiety    Allergies    FH: thoracic aortic aneurysm 03/23/2020   Family history of breast cancer    Family history of ovarian cancer    Family history of bladder cancer    Family history of prostate cancer    Breast cancer in female ([place]) 03/09/2020   Bilateral dry eyes 01/27/2019   Cervical cancer screening 09/12/2016   Hx of colonic polyp 09/12/2016   Colon polyp 09/12/2016   Preventative health care 07/23/2015   Asthma    Thyroid disease    Bipolar affective ([place])    Urinary incontinence 12/25/2011    REFERRING DIAG: left breast cancer at risk for lymphedema  THERAPY DIAG: Aftercare following surgery for neoplasm  PERTINENT HISTORY: L breast cancer, s/p bilateral breast lumpectomies 02/12/20, R was benign and 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   PAIN:  Are you having pain? No  SOZO SCREENING: Patient was assessed today using the SOZO machine to determine the lymphedema index score. This was compared to her baseline score. It was determined that she is within the recommended range when compared to her baseline and no further action is needed at this time. She will continue SOZO screenings. These are done every 3 months for 2 years post operatively followed by every 6 months for 2 years, and then annually.   L-DEX FLOWSHEETS - 11/28/21 1500       L-DEX LYMPHEDEMA SCREENING   Measurement Type  Unilateral    L-DEX MEASUREMENT EXTREMITY Upper Extremity    POSITION  Standing    DOMINANT SIDE Left    At Risk Side Left    BASELINE SCORE (UNILATERAL) -1.7    L-DEX SCORE (UNILATERAL) -2.3    VALUE CHANGE (UNILAT) -0.6              Otelia Limes, PTA 11/28/2021, 3:36 PM

## 2021-11-28 NOTE — Progress Notes (Signed)
[place]  [phone] [phone]     ID: DAVON FOLTA DOB: [birth year]  MR#: [id number]  HYI#:[id number]  Patient Care Team: Mosie Lukes, MD as PCP - General (Family Medicine) Berniece Salines, DO as PCP - Cardiology (Cardiology) Jovita Kussmaul, MD as Consulting Physician (General Surgery) Kyung Rudd, MD as Consulting Physician (Radiation Oncology) Arta Silence, MD as Consulting Physician (Gastroenterology) [place], Jennefer Bravo, MD as Referring Physician (Dermatology) Rockwell Germany, RN as Oncology Nurse Navigator Mauro Kaufmann, RN as Oncology Nurse Navigator Paula Compton, MD as Consulting Physician (Obstetrics and Gynecology) Benay Pike, MD OTHER MD:  CHIEF COMPLAINT: Estrogen receptor positive breast cancer  CURRENT TREATMENT:  None  INTERVAL HISTORY:  Asna returns today for telephone visit for follow-up on endometrial biopsy results.  She mentions that the endometrial biopsy results are thankfully negative but she is very worried about going back on tamoxifen.  She also has an endometrial polyp which needs to be removed and she currently is scheduled in September or will be scheduled in September.  She tells me that she give a good thought about taking tamoxifen and she does not want to proceed with it at this time.  She is willing to continue with mammograms and return to clinic for follow-up as scheduled.  REVIEW OF SYSTEMS:  COVID 19 VACCINATION STATUS: [place] x1, most recently 01/2019   HISTORY OF CURRENT ILLNESS: From the original intake note:  REIKO VINJE was initially evaluated in 05/2017 for a right nipple/breast mass. Diagnostic mammography showed no evidence of malignancy, and she was subsequently referred to Dr. Marlou Starks for further evaluation. She elected for follow up of the lesion. She was then seen by dermatology, who drained the lesion. The lesion resolved but began to reoccur 6 months later. She underwent  bilateral diagnostic mammography with tomography and right breast ultrasonography at [facility] on 10/21/2019 showing: breast density category C; dilated duct and nipple discharge from right breast; no intraductal mass or mammographic abnormality identified; right axilla negative for adenopathy.  She underwent breast MRI on 11/11/2019 showing: breast composition C; 1.8 cm linear clumped non-mass enhancement within lower right breast, 6 o'clock; 8 mm irregular enhancing mass within upper-outer left breast; benign-appearing 1.5 cm nonenhancing fluid-intensity mass/collection within right nipple with probable subareolar extension, corresponding to clinical area of concern.  Accordingly on 12/04/2019 she proceeded to biopsy of the bilateral breast areas in question. The pathology from this procedure ([id number]) showed:  1. Right Breast  - fibrocystic changes  - focal atypical lobular hyperplasia  --concordant   2. Left Breast  - benign breast tissue --discordant  As noted, the right breast biopsy was found to be concordant, but the left breast biopsy was discordant. She proceeded to bilateral lumpectomies on 02/12/2020 under Dr. Marlou Starks. Pathology from the procedure ([id number]) showed: 1. Right Breast  - fibrocystic change  - columnar cell change  - usual ductal hyperplasia  - intraductal papilloma  - associated microcalcifications 2. 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 Proliferation marker Ki67 at 2%. HER2 equivocal by immunohistochemistry (2+), but negative by fluorescent in situ hybridization with a signals ratio 1.19 and number per cell 1.73.  She underwent re-excision of the positive margin, as well as left sentinel  lymph node biopsy, on 03/04/2020. Pathology [id number]) showed: no residual carcinoma; benign lymph nodes (0/3).   Cancer Staging  Breast cancer in female [facility]) Staging form: Breast, AJCC 8th Edition - Pathologic stage from 03/09/2020: Stage IA (pT1a, pN0, cM0, G2, ER+, PR+, HER2-) - Signed by Chauncey Cruel, MD on 03/16/2020 Stage prefix: Initial diagnosis Multigene prognostic tests performed: None Histologic grading system: 3 grade system  The patient's subsequent history is as detailed below.   PAST MEDICAL HISTORY: Past Medical History:  Diagnosis Date   Allergies    Anxiety    Asthma    cough variant asthma developed in last couple of years   Bipolar 1 disorder ([place])    Breast cancer ([place]) 02/12/2020   left lumpectomy IDC w/radiation no chemo   Cervical cancer screening 09/12/2016   Colon polyp 09/12/2016   Family history of bladder cancer    Family history of breast cancer    Family history of ovarian cancer    Family history of prostate cancer    Hx of colonic polyp 09/12/2016   Colonoscopy 2017 with 1 small polyp, done by Dr [name], repeat colonoscopy in 5 years, 2022   Hypothyroidism    Personal history of radiation therapy    Preventative health care 07/23/2015   Thyroid disease     PAST SURGICAL HISTORY: Past Surgical History:  Procedure Laterality Date   APPENDECTOMY     BREAST LUMPECTOMY WITH RADIOACTIVE SEED LOCALIZATION Bilateral 02/12/2020   Procedure: BILATERAL BREAST LUMPECTOMY WITH RADIOACTIVE SEED LOCALIZATION;  Surgeon: Jovita Kussmaul, MD;  Location: [place];  Service: General;  Laterality: Bilateral;   RE-EXCISION OF BREAST LUMPECTOMY Left 03/04/2020   Procedure: LEFT BREAST MARGIN REEXCISION;  Surgeon: Jovita Kussmaul, MD;  Location: [place];  Service: General;  Laterality: Left;   SENTINEL NODE BIOPSY Left 03/04/2020   Procedure: SENTINEL NODE BIOPSY;  Surgeon: Jovita Kussmaul, MD;  Location: [place];  Service: General;  Laterality: Left;   SKIN BIOPSY     back moles removed (precancer?)    FAMILY HISTORY: Family History  Problem Relation Age of Onset   Arthritis Mother    Dementia Mother    Hypertension Father    Heart disease Father        MI, s/p triple bypass at age 24   Neuropathy Father        toxic peripheral    Cancer Paternal Grandfather        young, lung cancer?, heavy smoker   Cholelithiasis Sister    Aortic aneurysm Sister 83   Aneurysm Sister        descending aortic    Mental retardation Sister        ADD, schizoaffective d/o   Obesity Sister    Arthritis Brother        back disease   Ovarian cancer Cousin 8       maternal second cousin (MGF's great-niece)   Dementia Maternal Grandmother    Osteoarthritis Maternal Grandmother    Macular degeneration Maternal Grandfather    Osteoarthritis Maternal Grandfather    Congestive Heart Failure Paternal Grandmother    Breast cancer Cousin 42       paternal first cousin   Prostate cancer Paternal Uncle        spread to bladder, d. 74s/70s   Cancer Niece 11       soft tissue sarcoma  From the genetic counselors note (February 2022):  "Ms.  Shrestha's mother is alive at age 6 and may have had basal cell carcinoma of her face (unconfirmed). There were three maternal aunts. There is no known cancer among maternal aunts/uncles or maternal first cousins. Ms. Gassner maternal grandmother died at age 81 without cancer. Her maternal grandfather died at age 70 without cancer. A maternal second cousin (MGF's great-niece) died from ovarian cancer at age 68. A maternal first cousin once removed (MGF's niece) had an unknown type of gynecologic cancer in her 21s.    Ms. Dykes father is alive at age 73 and was diagnosed with bladder cancer at age 71. There are two paternal aunts and four paternal uncles. Once uncle died in his late 25s or early 26s with prostate cancer and bladder cancer. One paternal first cousin was  diagnosed with breast cancer around age 49. Ms. Zelada paternal grandmother died in her 81s without cancer. Her paternal grandfather died in his 80s from an unknown type of cancer (not colon cancer, possibly lung cancer). A paternal first cousin once removed (PGF's nephew) had lung cancer in his 72s, and died in his 71s with prostate cancer and bladder cancer.  "   GYNECOLOGIC HISTORY:   No LMP recorded. Patient is postmenopausal. Menarche: 56 years old Age at first live birth: 56 years old [place] P 2 LMP age 35 Contraceptive  HRT yes at least 4 years  Hysterectomy? no BSO? 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  Her husband Lynelle Smoke is an Chief Financial Officer.  He takes care of our blood analyzer is here and throughout the system.  Son Kevan Ny started forestry and lives in [place] week where he works for [employer] restoration taking care of their trails.  Son Alverda Skeans is 70   ADVANCED DIRECTIVES: In the absence of any documents to the contrary the patient's husband is her healthcare power of attorney   HEALTH MAINTENANCE: Social History   Tobacco Use   Smoking status: Never   Smokeless tobacco: Never  Substance Use Topics   Alcohol use: Yes    Comment: social   Drug use: No     Colonoscopy: 02/2015, Dr. [name], recall 2022  PAP: 08/2016, negative  Bone density: Never done   Allergies  Allergen Reactions   Gentamycin [Gentamicin]     Reacted to eye ointment, swelling red painful eyes    Current Outpatient Medications  Medication Sig Dispense Refill   albuterol (VENTOLIN HFA) 108 (90 Base) MCG/ACT inhaler Inhale 2 puffs into the lungs every 6 (six) hours as needed for wheezing or shortness of breath. 1 Inhaler 2   CALCIUM PO Take by mouth daily. Bone Up     Cholecalciferol (VITAMIN D-3) 125 MCG (5000 UT) TABS Take 1 tablet by mouth daily at 6 (six) AM.     lithium 300 MG tablet Take 300 mg by mouth 2 (two) times daily.     OVER  THE COUNTER MEDICATION daily. Iodoral     OVER THE COUNTER MEDICATION daily. ashwagandha     Probiotic Product (PROBIOTIC-10 PO) Take 1 capsule by mouth daily.     tamoxifen (NOLVADEX) 10 MG tablet TAKE 1 TABLET BY MOUTH EVERY DAY 90 tablet 1   thyroid (ARMOUR) 90 MG tablet Take 90 mg by mouth daily.     thyroid (ARMOUR) 90 MG tablet Take 1 tablet (90 mg total) by mouth daily. 30 tablet 4   Zinc 50 MG TABS Take 50 mg by  mouth daily.     No current facility-administered medications for this visit.    OBJECTIVE: White woman in no acute distress  Vitals:   11/28/21 1315  BP: 132/85  Pulse: 79  Resp: 16  Temp: 98.1 F (36.7 C)  SpO2: 98%      Body mass index is 24.68 kg/m.   Wt Readings from Last 3 Encounters:  11/28/21 152 lb 14.4 oz (69.4 kg)  09/12/21 151 lb (68.5 kg)  06/15/21 147 lb 6.4 oz (66.9 kg)      ECOG FS:1 - Symptomatic but completely ambulatory  Physical exam deferred, telephone visit  LAB RESULTS:  CMP     Component Value Date/Time   NA 141 09/28/2021 1003   NA 143 04/21/2020 0843   K 4.7 09/28/2021 1003   CL 104 09/28/2021 1003   CO2 31 09/28/2021 1003   GLUCOSE 86 09/28/2021 1003   BUN 19 09/28/2021 1003   BUN 13 04/21/2020 0843   CREATININE 0.94 09/28/2021 1003   CREATININE 0.90 05/03/2021 1113   CALCIUM 9.8 09/28/2021 1003   PROT 7.0 09/28/2021 1003   ALBUMIN 4.2 09/28/2021 1003   AST 13 09/28/2021 1003   AST 15 05/03/2021 1113   ALT 12 09/28/2021 1003   ALT 13 05/03/2021 1113   ALKPHOS 55 09/28/2021 1003   BILITOT 0.7 09/28/2021 1003   BILITOT 0.6 05/03/2021 1113   GFRNONAA >60 05/03/2021 1113   GFRAA >60 06/25/2014 1948    No results found for: "TOTALPROTELP", "ALBUMINELP", "A1GS", "A2GS", "BETS", "BETA2SER", "GAMS", "MSPIKE", "SPEI"  Lab Results  Component Value Date   WBC 5.1 11/28/2021   NEUTROABS 3.1 11/28/2021   HGB 14.0 11/28/2021   HCT 41.0 11/28/2021   MCV 91.9 11/28/2021   PLT 139 (L) 11/28/2021    No results found  for: "LABCA2"  No components found for: "JJHERD408"  No results for input(s): "INR" in the last 168 hours.  No results found for: "LABCA2"  No results found for: "XKG818"  No results found for: "CAN125"  No results found for: "CAN153"  No results found for: "CA2729"  No components found for: "HGQUANT"  No results found for: "CEA1", "CEA" / No results found for: "CEA1", "CEA"   No results found for: "AFPTUMOR"  No results found for: "CHROMOGRNA"  No results found for: "KPAFRELGTCHN", "LAMBDASER", "KAPLAMBRATIO" (kappa/lambda light chains)  No results found for: "HGBA", "HGBA2QUANT", "HGBFQUANT", "HGBSQUAN" (Hemoglobinopathy evaluation)   No results found for: "LDH"  No results found for: "IRON", "TIBC", "IRONPCTSAT" (Iron and TIBC)  No results found for: "FERRITIN"  Urinalysis    Component Value Date/Time   COLORURINE YELLOW 06/25/2014 [place] 06/25/2014 1850   LABSPEC 1.029 06/25/2014 1850   PHURINE 5.5 06/25/2014 1850   GLUCOSEU NEGATIVE 06/25/2014 1850   HGBUR TRACE (A) 06/25/2014 1850   BILIRUBINUR NEGATIVE 06/25/2014 1850   KETONESUR NEGATIVE 06/25/2014 1850   PROTEINUR NEGATIVE 06/25/2014 1850   UROBILINOGEN 0.2 06/25/2014 1850   NITRITE NEGATIVE 06/25/2014 1850   LEUKOCYTESUR SMALL (A) 06/25/2014 1850    STUDIES: MM DIAG BREAST TOMO BILATERAL  Result Date: 11/03/2021 CLINICAL DATA:  Left lumpectomy. Right surgical excision for atypia. Annual mammography. EXAM: DIGITAL DIAGNOSTIC BILATERAL MAMMOGRAM WITH TOMOSYNTHESIS TECHNIQUE: Bilateral digital diagnostic mammography and breast tomosynthesis was performed. COMPARISON:  Previous exam(s). ACR Breast Density Category c: The breast tissue is heterogeneously dense, which may obscure small masses. FINDINGS: No suspicious masses, calcifications, or distortion identified in either breast. The left lumpectomy changes are stable  with no evidence of recurrence. The right surgical changes are  stable. No mammographic evidence of malignancy. IMPRESSION: No mammographic evidence of malignancy. RECOMMENDATION: Annual diagnostic mammography. I have discussed the findings and recommendations with the patient. If applicable, a reminder letter will be sent to the patient regarding the next appointment. BI-RADS CATEGORY  2: Benign. Electronically Signed   By: Dorise Bullion III M.D.   On: 11/03/2021 16:37    ELIGIBLE FOR AVAILABLE RESEARCH PROTOCOL: no  ASSESSMENT: 56 y.o. [facility] woman status post bilateral lumpectomies 02/12/2020, showing  (a) in the right breast, an intraductal papilloma and atypical lobular hyperplasia  (b) in the left breast, a pT1a pNX, stage IA invasive ductal carcinoma, grade 2, with positive margins   (i) the invasive disease was estrogen and progesterone receptor positive, HER-2 not amplified, with an MIB-1 of 2%   (ii) additional surgery 03/04/2020 clear margins   (iii) left axillary sentinel lymph node sampling 03/04/2020 removed 2 lymph nodes both negative  (1) adjuvant radiation completed 04/30/2020  (2) genetics testing 04/02/2020 through the [facility] Panel found no deleterious mutations in AIP, ALK, APC, ATM, AXIN2, BAP1, BARD1, BLM, BMPR1A, BRCA1, BRCA2, BRIP1, CDC73, CDH1, CDK4, CDKN1B, CDKN2A, CHEK2, CTNNA1, DICER1, FANCC, FH, FLCN, GALNT12, KIF1B, LZTR1, MAX, MEN1, MET, MLH1, MSH2, MSH3, MSH6, MUTYH, NBN, NF1, NF2, NTHL1, PALB2, PHOX2B, PMS2, POT1, PRKAR1A, PTCH1, PTEN, RAD51C, RAD51D, RB1, RECQL, RET, SDHA, SDHAF2, SDHB, SDHC, SDHD, SMAD4, SMARCA4, SMARCB1, SMARCE1, STK11, SUFU, TMEM127, TP53, TSC1, TSC2, VHL and XRCC2 (sequencing and deletion/duplication); EGFR, EGLN1, HOXB13, KIT, MITF, PDGFRA, POLD1, and POLE (sequencing only); EPCAM and GREM1 (deletion/duplication only).   (3) declined antiestrogen therapy at this time  PLAN:    *Total Encounter Time as defined by the Centers for Medicare and Medicaid Services  includes, in addition to the face-to-face time of a patient visit (documented in the note above) non-face-to-face time: obtaining and reviewing outside history, ordering and reviewing medications, tests or procedures, care coordination (communications with other health care professionals or caregivers) and documentation in the medical record.

## 2021-11-30 ENCOUNTER — Encounter: Payer: Self-pay | Admitting: Hematology and Oncology

## 2021-12-12 ENCOUNTER — Ambulatory Visit: Payer: BC Managed Care – PPO

## 2021-12-15 ENCOUNTER — Encounter: Payer: Self-pay | Admitting: Family Medicine

## 2021-12-15 ENCOUNTER — Ambulatory Visit: Payer: BC Managed Care – PPO | Admitting: Family Medicine

## 2021-12-15 VITALS — BP 128/73 | HR 93 | Temp 98.4°F | Resp 18 | Ht 66.0 in | Wt 150.2 lb

## 2021-12-15 DIAGNOSIS — Z1152 Encounter for screening for COVID-19: Secondary | ICD-10-CM | POA: Diagnosis not present

## 2021-12-15 DIAGNOSIS — R6889 Other general symptoms and signs: Secondary | ICD-10-CM

## 2021-12-15 DIAGNOSIS — J029 Acute pharyngitis, unspecified: Secondary | ICD-10-CM | POA: Diagnosis not present

## 2021-12-15 DIAGNOSIS — F3111 Bipolar disorder, current episode manic without psychotic features, mild: Secondary | ICD-10-CM | POA: Diagnosis not present

## 2021-12-15 LAB — POCT INFLUENZA A/B
Influenza A, POC: NEGATIVE
Influenza B, POC: NEGATIVE

## 2021-12-15 LAB — POC COVID19 BINAXNOW: SARS Coronavirus 2 Ag: NEGATIVE

## 2021-12-15 LAB — POCT RAPID STREP A (OFFICE): Rapid Strep A Screen: NEGATIVE

## 2021-12-15 NOTE — Patient Instructions (Addendum)
Strep negative COVID negative  Flu negative Continue supportive measures including rest, hydration, humidifier use, steam showers, warm compresses to sinuses, warm liquids with lemon and honey, and over-the-counter cough, cold, and analgesics as needed.   Please contact office for follow-up if symptoms do not improve or worsen. Seek emergency care if symptoms become severe.    The following information is provided as a Human resources officer for ADULT patients only and does NOT take into account PREGNANCY, ALLERGIES, LIVER CONDITIONS, KIDNEY CONDITIONS, GASTROINTESTINAL CONDITIONS, OR PRESCRIPTION MEDICATION INTERACTIONS. Please be sure to ask your provider if the following are safe to take with your specific medical history, conditions, or current medication regimen if you are unsure.   Adult Basic Symptom Management   Congestion: Guaifenesin (Mucinex)- follow directions on packaging with a maximum dose of '2400mg'$  in a 24 hour period.  Pain/Fever: Ibuprofen '200mg'$  - '400mg'$  every 4-6 hours as needed (MAX '1200mg'$  in a 24 hour period) Pain/Fever: Tylenol '500mg'$  -'1000mg'$  every 6-8 hours as needed (MAX '3000mg'$  in a 24 hour period)  Cough: Dextromethorphan (Delsym)- follow directions on packing with a maximum dose of '120mg'$  in a 24 hour period.  Nasal Stuffiness: Saline nasal spray and/or Nettie Pot with sterile saline solution  Runny Nose: Fluticasone nasal spray (Flonase) OR Mometasone nasal spray (Nasonex) OR Triamcinolone Acetonide nasal spray (Nasacort)- follow directions on the packaging  Pain/Pressure: Warm washcloth to the face  Sore Throat: Warm salt water gargles  If you have allergies, you may also consider taking an oral antihistamine (like Zyrtec or Claritin) as these may also help with your symptoms.  **Many medications will have more than one ingredient, be sure you are reading the packaging carefully and not taking more than one dose of the same kind of medication at the same time or too  close together. It is OK to use formulas that have all of the ingredients you want, but do not take them in a combined medication and as separate dose too close together. If you have any questions, the pharmacist will be happy to help you decide what is safe.

## 2021-12-15 NOTE — Progress Notes (Signed)
Acute Office Visit  Subjective:     Patient ID: Cheyenne Garner, female    DOB: Nov 30, 1965, 56 y.o.   MRN: [id number]  Chief Complaint  Patient presents with   Sinus Problem   Sore Throat   Generalized Body Aches     Patient is in today for URI symptoms.  Upper Respiratory Infection: Patient complains of symptoms of a URI. Symptoms include cough, fever, sore throat, swollen glands, and nausea, body aches, chills, bilateral ear pain. Onset of symptoms was 2 days ago, gradually worsening since that time.  She is drinking plenty of fluids. Evaluation to date: none. Treatment to date:  tylenol . Home COVID test negative yesterday. She was with family over Thanksgiving son and his GF with similar symptoms, they were negative COVID.      ROS All review of systems negative except what is listed in the HPI      Objective:    BP 128/73   Pulse 93   Temp 98.4 F (36.9 C)   Resp 18   Ht '5\' 6"'$  (1.676 m)   Wt 150 lb 3.2 oz (68.1 kg)   SpO2 99%   BMI 24.24 kg/m    Physical Exam Vitals reviewed.  Constitutional:      General: She is not in acute distress.    Appearance: She is well-developed and normal weight. She is not ill-appearing.  HENT:     Head: Normocephalic and atraumatic.     Right Ear: Tympanic membrane normal.     Left Ear: Tympanic membrane normal.     Mouth/Throat:     Mouth: Mucous membranes are moist.     Pharynx: Oropharynx is clear. Uvula midline. Posterior oropharyngeal erythema present. No oropharyngeal exudate.     Tonsils: No tonsillar exudate or tonsillar abscesses.  Eyes:     Conjunctiva/sclera: Conjunctivae normal.  Cardiovascular:     Rate and Rhythm: Normal rate and regular rhythm.  Pulmonary:     Effort: Pulmonary effort is normal.     Breath sounds: Normal breath sounds.  Musculoskeletal:     Cervical back: Normal range of motion and neck supple. Tenderness present.  Lymphadenopathy:     Cervical: Cervical adenopathy present.  Skin:     General: Skin is warm and dry.     Capillary Refill: Capillary refill takes less than 2 seconds.  Neurological:     General: No focal deficit present.     Mental Status: She is alert and oriented to person, place, and time.  Psychiatric:        Mood and Affect: Mood normal.        Behavior: Behavior normal.     Results for orders placed or performed in visit on 12/15/21  POCT rapid strep A  Result Value Ref Range   Rapid Strep A Screen Negative Negative        Assessment & Plan:   Problem List Items Addressed This Visit   None Visit Diagnoses     Sore throat    -  Primary   Relevant Orders   POCT rapid strep A (Completed)   Encounter for screening for COVID-19       Relevant Orders   POC COVID-19   Flu-like symptoms       Relevant Orders   POCT Influenza A/B      Strep negative COVID negative  Flu negative Continue supportive measures including rest, hydration, humidifier use, steam showers, warm compresses to sinuses, warm liquids with lemon  and honey, and over-the-counter cough, cold, and analgesics as needed.   Please contact office for follow-up if symptoms do not improve or worsen. Seek emergency care if symptoms become severe.    No orders of the defined types were placed in this encounter.   Return if symptoms worsen or fail to improve.  Terrilyn Saver, NP

## 2021-12-16 ENCOUNTER — Encounter: Payer: Self-pay | Admitting: Family Medicine

## 2021-12-16 ENCOUNTER — Other Ambulatory Visit: Payer: Self-pay | Admitting: Family Medicine

## 2021-12-16 ENCOUNTER — Telehealth: Payer: Self-pay | Admitting: Family Medicine

## 2021-12-16 DIAGNOSIS — J039 Acute tonsillitis, unspecified: Secondary | ICD-10-CM

## 2021-12-16 MED ORDER — AMOXICILLIN 500 MG PO CAPS: 500.0000 mg | ORAL_CAPSULE | Freq: Three times a day (TID) | ORAL | 0 refills | Status: AC

## 2021-12-16 MED ORDER — AMOXICILLIN-POT CLAVULANATE 875-125 MG PO TABS: 1.0000 | ORAL_TABLET | Freq: Two times a day (BID) | ORAL | 0 refills | Status: DC

## 2021-12-16 NOTE — Telephone Encounter (Signed)
Pt states sxs have not gotten any better, and there are now white spots in the back of her throat. She would like to know what she should do.    [id number]- [place], [place] - [address], [place] [id number]: [phone] Fax: [phone]

## 2021-12-19 ENCOUNTER — Other Ambulatory Visit: Payer: Self-pay | Admitting: Family Medicine

## 2021-12-19 ENCOUNTER — Other Ambulatory Visit (HOSPITAL_BASED_OUTPATIENT_CLINIC_OR_DEPARTMENT_OTHER): Payer: Self-pay

## 2021-12-19 MED ORDER — THYROID 90 MG PO TABS
90.0000 mg | ORAL_TABLET | Freq: Every day | ORAL | 4 refills | Status: DC
  Filled 2021-12-19: qty 30, 30d supply, fill #0

## 2021-12-19 NOTE — Telephone Encounter (Signed)
Pt stated much better but Just little headaches

## 2021-12-19 NOTE — Telephone Encounter (Signed)
Called pt was advised and pt stated feeling better

## 2021-12-21 ENCOUNTER — Other Ambulatory Visit: Payer: Self-pay | Admitting: Family Medicine

## 2021-12-21 MED ORDER — THYROID 90 MG PO TABS: 90.0000 mg | ORAL_TABLET | Freq: Every day | ORAL | 5 refills | Status: DC

## 2022-01-02 ENCOUNTER — Telehealth: Payer: Self-pay | Admitting: Hematology and Oncology

## 2022-01-02 NOTE — Telephone Encounter (Signed)
Patient called to cancel upcoming phone visit. Cancelled for patient. Patient will call when she knows her work schedule for January.

## 2022-01-05 ENCOUNTER — Inpatient Hospital Stay: Payer: BC Managed Care – PPO | Admitting: Hematology and Oncology

## 2022-01-14 ENCOUNTER — Other Ambulatory Visit (HOSPITAL_BASED_OUTPATIENT_CLINIC_OR_DEPARTMENT_OTHER): Payer: Self-pay

## 2022-01-15 ENCOUNTER — Other Ambulatory Visit (HOSPITAL_BASED_OUTPATIENT_CLINIC_OR_DEPARTMENT_OTHER): Payer: Self-pay

## 2022-01-17 ENCOUNTER — Other Ambulatory Visit (HOSPITAL_BASED_OUTPATIENT_CLINIC_OR_DEPARTMENT_OTHER): Payer: Self-pay

## 2022-01-18 ENCOUNTER — Other Ambulatory Visit (HOSPITAL_BASED_OUTPATIENT_CLINIC_OR_DEPARTMENT_OTHER): Payer: Self-pay

## 2022-01-19 ENCOUNTER — Other Ambulatory Visit (HOSPITAL_BASED_OUTPATIENT_CLINIC_OR_DEPARTMENT_OTHER): Payer: Self-pay

## 2022-01-19 ENCOUNTER — Encounter: Payer: Self-pay | Admitting: Family Medicine

## 2022-01-20 ENCOUNTER — Other Ambulatory Visit (HOSPITAL_BASED_OUTPATIENT_CLINIC_OR_DEPARTMENT_OTHER): Payer: Self-pay

## 2022-01-20 MED ORDER — THYROID 90 MG PO TABS
90.0000 mg | ORAL_TABLET | Freq: Every day | ORAL | 5 refills | Status: DC
  Filled 2022-01-20 (×2): qty 30, 30d supply, fill #0
  Filled 2022-02-14: qty 30, 30d supply, fill #1
  Filled 2022-03-15: qty 30, 30d supply, fill #2
  Filled 2022-04-17: qty 30, 30d supply, fill #3
  Filled 2022-05-15: qty 30, 30d supply, fill #4
  Filled 2022-06-14: qty 30, 30d supply, fill #5

## 2022-02-27 ENCOUNTER — Ambulatory Visit: Payer: BC Managed Care – PPO

## 2022-03-02 DIAGNOSIS — Z5181 Encounter for therapeutic drug level monitoring: Secondary | ICD-10-CM | POA: Diagnosis not present

## 2022-03-13 ENCOUNTER — Ambulatory Visit: Payer: BC Managed Care – PPO | Attending: General Surgery

## 2022-03-13 VITALS — Wt 154.4 lb

## 2022-03-13 DIAGNOSIS — Z483 Aftercare following surgery for neoplasm: Secondary | ICD-10-CM | POA: Insufficient documentation

## 2022-03-13 NOTE — Therapy (Signed)
OUTPATIENT PHYSICAL THERAPY SOZO SCREENING NOTE   Patient Name: Cheyenne Garner MRN: [id number] DOB:1965-10-05, 57 y.o., female Today's Date: 03/13/2022  PCP: Mosie Lukes, MD REFERRING PROVIDER: Jovita Kussmaul, MD   PT End of Session - 03/13/22 1653     Visit Number 14   # unchanged due to screen only   PT Start Time 1651    PT Stop Time 1655    PT Time Calculation (min) 4 min    Activity Tolerance Patient tolerated treatment well    Behavior During Therapy [facility] for tasks assessed/performed             Past Medical History:  Diagnosis Date   Allergies    Anxiety    Asthma    cough variant asthma developed in last couple of years   Bipolar 1 disorder ([place])    Breast cancer ([place]) 02/12/2020   left lumpectomy IDC w/radiation no chemo   Cervical cancer screening 09/12/2016   Colon polyp 09/12/2016   Family history of bladder cancer    Family history of breast cancer    Family history of ovarian cancer    Family history of prostate cancer    Hx of colonic polyp 09/12/2016   Colonoscopy 2017 with 1 small polyp, done by Dr [name], repeat colonoscopy in 5 years, 2022   Hypothyroidism    Personal history of radiation therapy    Preventative health care 07/23/2015   Thyroid disease    Past Surgical History:  Procedure Laterality Date   APPENDECTOMY     BREAST LUMPECTOMY WITH RADIOACTIVE SEED LOCALIZATION Bilateral 02/12/2020   Procedure: BILATERAL BREAST LUMPECTOMY WITH RADIOACTIVE SEED LOCALIZATION;  Surgeon: Jovita Kussmaul, MD;  Location: [place];  Service: General;  Laterality: Bilateral;   RE-EXCISION OF BREAST LUMPECTOMY Left 03/04/2020   Procedure: LEFT BREAST MARGIN REEXCISION;  Surgeon: Jovita Kussmaul, MD;  Location: [place];  Service: General;  Laterality: Left;   SENTINEL NODE BIOPSY Left 03/04/2020   Procedure: SENTINEL NODE BIOPSY;  Surgeon: Jovita Kussmaul, MD;  Location: [place];  Service: General;   Laterality: Left;   SKIN BIOPSY     back moles removed (precancer?)   Patient Active Problem List   Diagnosis Date Noted   Hyperglycemia 11/08/2020   Elevated sed rate 11/08/2020   [place] 07/14/2020   Malignant neoplasm of overlapping sites of left breast in female, estrogen receptor positive ([place]) 05/18/2020   Genetic testing 04/07/2020   Hypothyroidism    Anxiety    Allergies    FH: thoracic aortic aneurysm 03/23/2020   Family history of breast cancer    Family history of ovarian cancer    Family history of bladder cancer    Family history of prostate cancer    Breast cancer in female ([place]) 03/09/2020   Bilateral dry eyes 01/27/2019   Cervical cancer screening 09/12/2016   Hx of colonic polyp 09/12/2016   Colon polyp 09/12/2016   Preventative health care 07/23/2015   Asthma    Thyroid disease    Bipolar affective ([place])    Urinary incontinence 12/25/2011    REFERRING DIAG: left breast cancer at risk for lymphedema  THERAPY DIAG: Aftercare following surgery for neoplasm  PERTINENT HISTORY: L breast cancer, s/p bilateral breast lumpectomies 02/12/20, R was benign and 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   PAIN:  Are you having pain? No  SOZO SCREENING: Patient was assessed today using the SOZO machine to determine the lymphedema index score. This was compared to her baseline score. It was determined that she is within the recommended range when compared to her baseline and no further action is needed at this time. She will continue SOZO screenings. These are done every 3 months for 2 years post operatively followed by every 6 months for 2 years, and then annually.  Pt will begin 6 month screens now.    L-DEX FLOWSHEETS - 03/13/22 1600       L-DEX  LYMPHEDEMA SCREENING   Measurement Type Unilateral    L-DEX MEASUREMENT EXTREMITY Upper Extremity    POSITION  Standing    DOMINANT SIDE Left    At Risk Side Left    BASELINE SCORE (UNILATERAL) -1.7    L-DEX SCORE (UNILATERAL) -3.1    VALUE CHANGE (UNILAT) -1.4              Otelia Limes, PTA 03/13/2022, 4:54 PM

## 2022-03-17 ENCOUNTER — Encounter: Payer: Self-pay | Admitting: Family Medicine

## 2022-03-17 ENCOUNTER — Other Ambulatory Visit: Payer: Self-pay

## 2022-03-17 ENCOUNTER — Other Ambulatory Visit (HOSPITAL_BASED_OUTPATIENT_CLINIC_OR_DEPARTMENT_OTHER): Payer: Self-pay

## 2022-03-17 MED ORDER — ALBUTEROL SULFATE HFA 108 (90 BASE) MCG/ACT IN AERS
2.0000 | INHALATION_SPRAY | Freq: Four times a day (QID) | RESPIRATORY_TRACT | 2 refills | Status: AC | PRN
  Filled 2022-03-17: qty 6.7, 28d supply, fill #0

## 2022-03-17 NOTE — Telephone Encounter (Signed)
Medication sent.

## 2022-03-27 NOTE — Assessment & Plan Note (Signed)
On Levothyroxine, continue to monitor 

## 2022-03-27 NOTE — Assessment & Plan Note (Signed)
Following with oncology 

## 2022-03-27 NOTE — Assessment & Plan Note (Signed)
hgba1c acceptable, minimize simple carbs. Increase exercise as tolerated.  

## 2022-03-27 NOTE — Assessment & Plan Note (Signed)
Patient encouraged to maintain heart healthy diet, regular exercise, adequate sleep. Consider daily probiotics. Take medications as prescribed. Labs reviewed. . Following with oncology for mgm last one 10/2021, Colonoscopy 2017 repeat in 5 years. Pap 2022 ASCUS referred to OB/GYN

## 2022-03-28 ENCOUNTER — Ambulatory Visit (INDEPENDENT_AMBULATORY_CARE_PROVIDER_SITE_OTHER): Payer: BC Managed Care – PPO | Admitting: Family Medicine

## 2022-03-28 ENCOUNTER — Encounter: Payer: Self-pay | Admitting: Family Medicine

## 2022-03-28 VITALS — BP 118/70 | HR 72 | Temp 97.5°F | Resp 16 | Ht 66.0 in | Wt 156.0 lb

## 2022-03-28 DIAGNOSIS — R700 Elevated erythrocyte sedimentation rate: Secondary | ICD-10-CM

## 2022-03-28 DIAGNOSIS — R739 Hyperglycemia, unspecified: Secondary | ICD-10-CM | POA: Diagnosis not present

## 2022-03-28 DIAGNOSIS — Z8616 Personal history of COVID-19: Secondary | ICD-10-CM | POA: Insufficient documentation

## 2022-03-28 DIAGNOSIS — C50911 Malignant neoplasm of unspecified site of right female breast: Secondary | ICD-10-CM

## 2022-03-28 DIAGNOSIS — Z0000 Encounter for general adult medical examination without abnormal findings: Secondary | ICD-10-CM

## 2022-03-28 DIAGNOSIS — E039 Hypothyroidism, unspecified: Secondary | ICD-10-CM

## 2022-03-28 DIAGNOSIS — C50912 Malignant neoplasm of unspecified site of left female breast: Secondary | ICD-10-CM | POA: Diagnosis not present

## 2022-03-28 LAB — CBC WITH DIFFERENTIAL/PLATELET
Basophils Absolute: 0.1 10*3/uL (ref 0.0–0.1)
Basophils Relative: 1.2 % (ref 0.0–3.0)
Eosinophils Absolute: 0.1 10*3/uL (ref 0.0–0.7)
Eosinophils Relative: 2.4 % (ref 0.0–5.0)
HCT: 43.9 % (ref 36.0–46.0)
Hemoglobin: 14.5 g/dL (ref 12.0–15.0)
Lymphocytes Relative: 31.1 % (ref 12.0–46.0)
Lymphs Abs: 1.5 10*3/uL (ref 0.7–4.0)
MCHC: 33 g/dL (ref 30.0–36.0)
MCV: 92.5 fl (ref 78.0–100.0)
Monocytes Absolute: 0.3 10*3/uL (ref 0.1–1.0)
Monocytes Relative: 7.2 % (ref 3.0–12.0)
Neutro Abs: 2.8 10*3/uL (ref 1.4–7.7)
Neutrophils Relative %: 58.1 % (ref 43.0–77.0)
Platelets: 149 10*3/uL — ABNORMAL LOW (ref 150.0–400.0)
RBC: 4.74 Mil/uL (ref 3.87–5.11)
RDW: 12.6 % (ref 11.5–15.5)
WBC: 4.8 10*3/uL (ref 4.0–10.5)

## 2022-03-28 LAB — COMPREHENSIVE METABOLIC PANEL
ALT: 11 U/L (ref 0–35)
AST: 14 U/L (ref 0–37)
Albumin: 3.9 g/dL (ref 3.5–5.2)
Alkaline Phosphatase: 62 U/L (ref 39–117)
BUN: 19 mg/dL (ref 6–23)
CO2: 30 mEq/L (ref 19–32)
Calcium: 9.6 mg/dL (ref 8.4–10.5)
Chloride: 102 mEq/L (ref 96–112)
Creatinine, Ser: 0.85 mg/dL (ref 0.40–1.20)
GFR: 76.22 mL/min (ref >60.00)
Glucose, Bld: 89 mg/dL (ref 70–99)
Potassium: 4 mEq/L (ref 3.5–5.1)
Sodium: 137 mEq/L (ref 135–145)
Total Bilirubin: 0.7 mg/dL (ref 0.2–1.2)
Total Protein: 7.2 g/dL (ref 6.0–8.3)

## 2022-03-28 LAB — LIPID PANEL
Cholesterol: 172 mg/dL (ref 0–200)
HDL: 72.9 mg/dL (ref >39.00)
LDL Cholesterol: 82 mg/dL (ref 0–99)
NonHDL: 99.54
Total CHOL/HDL Ratio: 2
Triglycerides: 89 mg/dL (ref 0.0–149.0)
VLDL: 17.8 mg/dL (ref 0.0–40.0)

## 2022-03-28 LAB — SEDIMENTATION RATE: Sed Rate: 3 mm/hr (ref 0–30)

## 2022-03-28 LAB — TSH: TSH: 1.64 u[IU]/mL (ref 0.35–5.50)

## 2022-03-28 LAB — HEMOGLOBIN A1C: Hgb A1c MFr Bld: 5.1 % (ref 4.6–6.5)

## 2022-03-28 NOTE — Assessment & Plan Note (Signed)
Had COVID in October from a family wedding. Is fully recovered

## 2022-03-28 NOTE — Patient Instructions (Addendum)
Shingrix is the new shingles shot, 2 shots over 2-6 months, confirm coverage with insurance and document, then can return here for shots with nurse appt or at pharmacy   Tetanus 2027 or sooner if injured  Preventive Care 46-57 Years Old, Female Preventive care refers to lifestyle choices and visits with your health care provider that can promote health and wellness. Preventive care visits are also called wellness exams. What can I expect for my preventive care visit? Counseling Your health care provider may ask you questions about your: Medical history, including: Past medical problems. Family medical history. Pregnancy history. Current health, including: Menstrual cycle. Method of birth control. Emotional well-being. Home life and relationship well-being. Sexual activity and sexual health. Lifestyle, including: Alcohol, nicotine or tobacco, and drug use. Access to firearms. Diet, exercise, and sleep habits. Work and work Statistician. Sunscreen use. Safety issues such as seatbelt and bike helmet use. Physical exam Your health care provider will check your: Height and weight. These may be used to calculate your BMI (body mass index). BMI is a measurement that tells if you are at a healthy weight. Waist circumference. This measures the distance around your waistline. This measurement also tells if you are at a healthy weight and may help predict your risk of certain diseases, such as type 2 diabetes and high blood pressure. Heart rate and blood pressure. Body temperature. Skin for abnormal spots. What immunizations do I need?  Vaccines are usually given at various ages, according to a schedule. Your health care provider will recommend vaccines for you based on your age, medical history, and lifestyle or other factors, such as travel or where you work. What tests do I need? Screening Your health care provider may recommend screening tests for certain conditions. This may  include: Lipid and cholesterol levels. Diabetes screening. This is done by checking your blood sugar (glucose) after you have not eaten for a while (fasting). Pelvic exam and Pap test. Hepatitis B test. Hepatitis C test. HIV (human immunodeficiency virus) test. STI (sexually transmitted infection) testing, if you are at risk. Lung cancer screening. Colorectal cancer screening. Mammogram. Talk with your health care provider about when you should start having regular mammograms. This may depend on whether you have a family history of breast cancer. BRCA-related cancer screening. This may be done if you have a family history of breast, ovarian, tubal, or peritoneal cancers. Bone density scan. This is done to screen for osteoporosis. Talk with your health care provider about your test results, treatment options, and if necessary, the need for more tests. Follow these instructions at home: Eating and drinking  Eat a diet that includes fresh fruits and vegetables, whole grains, lean protein, and low-fat dairy products. Take vitamin and mineral supplements as recommended by your health care provider. Do not drink alcohol if: Your health care provider tells you not to drink. You are pregnant, may be pregnant, or are planning to become pregnant. If you drink alcohol: Limit how much you have to 0-1 drink a day. Know how much alcohol is in your drink. In the [place] one drink equals one 12 oz bottle of beer (355 mL), one 5 oz glass of wine (148 mL), or one 1 oz glass of hard liquor (44 mL). Lifestyle Brush your teeth every morning and night with fluoride toothpaste. Floss one time each day. Exercise for at least 30 minutes 5 or more days each week. Do not use any products that contain nicotine or tobacco. These products include cigarettes, chewing  tobacco, and vaping devices, such as e-cigarettes. If you need help quitting, ask your health care provider. Do not use drugs. If you are sexually  active, practice safe sex. Use a condom or other form of protection to prevent STIs. If you do not wish to become pregnant, use a form of birth control. If you plan to become pregnant, see your health care provider for a prepregnancy visit. Take aspirin only as told by your health care provider. Make sure that you understand how much to take and what form to take. Work with your health care provider to find out whether it is safe and beneficial for you to take aspirin daily. Find healthy ways to manage stress, such as: Meditation, yoga, or listening to music. Journaling. Talking to a trusted person. Spending time with friends and family. Minimize exposure to UV radiation to reduce your risk of skin cancer. Safety Always wear your seat belt while driving or riding in a vehicle. Do not drive: If you have been drinking alcohol. Do not ride with someone who has been drinking. When you are tired or distracted. While texting. If you have been using any mind-altering substances or drugs. Wear a helmet and other protective equipment during sports activities. If you have firearms in your house, make sure you follow all gun safety procedures. Seek help if you have been physically or sexually abused. What's next? Visit your health care provider once a year for an annual wellness visit. Ask your health care provider how often you should have your eyes and teeth checked. Stay up to date on all vaccines. This information is not intended to replace advice given to you by your health care provider. Make sure you discuss any questions you have with your health care provider. Document Revised: 06/30/2020 Document Reviewed: 06/30/2020 Elsevier Patient Education  Cheyenne Garner.

## 2022-03-28 NOTE — Progress Notes (Signed)
Subjective:   By signing my name below, I, Kellie Simmering, attest that this documentation has been prepared under the direction and in the presence of Mosie Lukes, MD., 03/28/2022.   Patient ID: Cheyenne Garner, female    DOB: 1965/12/14, 57 y.o.   MRN: [id number]  Chief Complaint  Patient presents with   Annual Exam    Annual Exam    HPI Patient is in today for a comprehensive physical exam and follow up on chronic medical concerns. She denies CP/palpitations/SOB/HA/congestion/ fever/chills/GI or GU symptoms.  Asthma/Allergies Patient was seen by Caleen Jobs, NP, in 12/2021 due to worsening cough, sore throat, and myalgias. Today she denies these symptoms but reports that recently she has been experiencing residual cough which she has been managing with her Albuterol inhaler. She has stopped using her nebulizer and states it has been 2 weeks since she last used her inhaler. Additionally, she continues to experience flares in her allergies and is using otc antihistamines prn.   Breast Cancer Patient continues to follow with her oncologist Dr. Chryl Heck and reports that Tamoxifen 10 mg has been tolerable. Denies vaginal bleeding. She confirms having slight discomfort in her breasts but has been applying vitamin E oil. She further reports that her lymphatic fluid is normal and she is now partaking in physical therapy every 6 months. She continues to receive annual mammograms.   Family History Her father passed away in 2022/03/30 at 57 yo due to bladder cancer and heart disease. Her 25 yo mother is diagnosed with Alzheimer's.   Past Medical History:  Diagnosis Date   Allergies    Anxiety    Asthma    cough variant asthma developed in last couple of years   Bipolar 1 disorder ([place])    Breast cancer ([place]) 02/12/2020   left lumpectomy IDC w/radiation no chemo   Cervical cancer screening 09/12/2016   Colon polyp 09/12/2016   Family history of bladder cancer    Family history of breast cancer     Family history of ovarian cancer    Family history of prostate cancer    Hx of colonic polyp 09/12/2016   Colonoscopy 2017 with 1 small polyp, done by Dr [name], repeat colonoscopy in 5 years, 2022   Hypothyroidism    Personal history of radiation therapy    Preventative health care 07/23/2015   Thyroid disease     Past Surgical History:  Procedure Laterality Date   APPENDECTOMY     BREAST LUMPECTOMY WITH RADIOACTIVE SEED LOCALIZATION Bilateral 02/12/2020   Procedure: BILATERAL BREAST LUMPECTOMY WITH RADIOACTIVE SEED LOCALIZATION;  Surgeon: Jovita Kussmaul, MD;  Location: [place];  Service: General;  Laterality: Bilateral;   RE-EXCISION OF BREAST LUMPECTOMY Left 03/30/2020   Procedure: LEFT BREAST MARGIN REEXCISION;  Surgeon: Jovita Kussmaul, MD;  Location: [place];  Service: General;  Laterality: Left;   SENTINEL NODE BIOPSY Left 2020/03/30   Procedure: SENTINEL NODE BIOPSY;  Surgeon: Jovita Kussmaul, MD;  Location: [place];  Service: General;  Laterality: Left;   SKIN BIOPSY     back moles removed (precancer?)    Family History  Problem Relation Age of Onset   Arthritis Mother    Dementia Mother        alzheimers   Cancer Father        bladder   Hypertension Father    Heart disease Father        MI, s/p triple bypass at  age 59   Neuropathy Father        toxic peripheral    Other Father        peripheral neuropathy   Cholelithiasis Sister    Aortic aneurysm Sister 31   Aneurysm Sister        descending aortic    Mental retardation Sister        ADD, schizoaffective d/o   Obesity Sister    Depression Brother    Arthritis Brother        back disease   Prostate cancer Paternal Uncle        spread to bladder, d. 60s/70s   Dementia Maternal Grandmother    Osteoarthritis Maternal Grandmother    Macular degeneration Maternal Grandfather    Osteoarthritis Maternal Grandfather    Congestive Heart Failure Paternal  Grandmother    Cancer Paternal Grandfather        young, lung cancer?, heavy smoker   Ovarian cancer Cousin 63       maternal second cousin (MGF's great-niece)   Breast cancer Cousin 16       paternal first cousin   Cancer Niece 90       soft tissue sarcoma    Social History   Socioeconomic History   Marital status: Married    Spouse name: Not on file   Number of children: Not on file   Years of education: Not on file   Highest education level: Not on file  Occupational History   Not on file  Tobacco Use   Smoking status: Never   Smokeless tobacco: Never  Substance and Sexual Activity   Alcohol use: Yes    Comment: social   Drug use: No   Sexual activity: Yes    Birth control/protection: None, Post-menopausal  Other Topics Concern   Not on file  Social History Narrative   Lives with husband, 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 No major dietary restrictions   Social Determinants of Radio broadcast assistant Strain: Not on file  Food Insecurity: Not on file  Transportation Needs: Not on file  Physical Activity: Not on file  Stress: Not on file  Social Connections: Not on file  Intimate Partner Violence: Not on file    Outpatient Medications Prior to Visit  Medication Sig Dispense Refill   albuterol (VENTOLIN HFA) 108 (90 Base) MCG/ACT inhaler Inhale 2 puffs into the lungs every 6 (six) hours as needed for wheezing or shortness of breath. 6.7 g 2   CALCIUM PO Take by mouth daily. Bone Up     Cholecalciferol (VITAMIN D-3) 125 MCG (5000 UT) TABS Take 1 tablet by mouth daily at 6 (six) AM.     lithium 300 MG tablet Take 300 mg by mouth 2 (two) times daily.     OVER THE COUNTER MEDICATION daily. Iodoral     OVER THE COUNTER MEDICATION daily. ashwagandha     Probiotic Product (PROBIOTIC-10 PO) Take 1 capsule by mouth daily.     tamoxifen (NOLVADEX) 10 MG tablet Take 1 tablet (10 mg total) by mouth daily. 90 tablet 1   thyroid (NP THYROID) 90 MG tablet  Take 1 tablet (90 mg total) by mouth daily. 30 tablet 5   Zinc 50 MG TABS Take 50 mg by mouth daily.     amoxicillin-clavulanate (AUGMENTIN) 875-125 MG tablet Take 1 tablet by mouth 2 (two) times daily. 20 tablet 0   No facility-administered medications prior to visit.  Allergies  Allergen Reactions   Gentamycin [Gentamicin]     Reacted to eye ointment, swelling red painful eyes    Review of Systems  Constitutional:  Negative for chills and fever.  HENT:  Negative for congestion.   Respiratory:  Negative for shortness of breath.   Cardiovascular:  Negative for chest pain and palpitations.  Gastrointestinal:  Negative for abdominal pain, blood in stool, constipation, diarrhea, nausea and vomiting.  Genitourinary:  Negative for dysuria, frequency, hematuria and urgency.  Skin:  Positive for itching (beneath left breast).          Neurological:  Negative for headaches.       Objective:    Physical Exam Constitutional:      General: She is not in acute distress.    Appearance: Normal appearance. She is normal weight. She is not ill-appearing.  HENT:     Head: Normocephalic and atraumatic.     Right Ear: Tympanic membrane, ear canal and external ear normal.     Left Ear: Tympanic membrane, ear canal and external ear normal.     Nose: Nose normal.     Mouth/Throat:     Mouth: Mucous membranes are moist.     Pharynx: Oropharynx is clear.  Eyes:     General:        Right eye: No discharge.        Left eye: No discharge.     Extraocular Movements: Extraocular movements intact.     Right eye: No nystagmus.     Left eye: No nystagmus.     Pupils: Pupils are equal, round, and reactive to light.  Neck:     Vascular: No carotid bruit.  Cardiovascular:     Rate and Rhythm: Normal rate and regular rhythm.     Pulses: Normal pulses.     Heart sounds: Normal heart sounds. No murmur heard.    No gallop.  Pulmonary:     Effort: Pulmonary effort is normal. No respiratory  distress.     Breath sounds: Normal breath sounds. No wheezing or rales.  Abdominal:     General: Bowel sounds are normal.     Palpations: Abdomen is soft.     Tenderness: There is no abdominal tenderness. There is no guarding.  Musculoskeletal:        General: Normal range of motion.     Cervical back: Normal range of motion.     Right lower leg: No edema.     Left lower leg: No edema.     Comments: Muscle strength 5/5 on upper and lower extremities.   Lymphadenopathy:     Cervical: No cervical adenopathy.  Skin:    General: Skin is warm and dry.     Comments: Reactive sebaceous gland beneath left breast.  Neurological:     Mental Status: She is alert and oriented to person, place, and time.     Sensory: Sensation is intact.     Motor: Motor function is intact.     Coordination: Coordination is intact.     Deep Tendon Reflexes:     Reflex Scores:      Patellar reflexes are 2+ on the right side and 2+ on the left side. Psychiatric:        Mood and Affect: Mood normal.        Behavior: Behavior normal.        Judgment: Judgment normal.    BP 118/70 (BP Location: Right Arm, Patient Position: Sitting, Cuff Size: Normal)  Pulse 72   Temp (!) 97.5 F (36.4 C) (Oral)   Resp 16   Ht '5\' 6"'$  (1.676 m)   Wt 156 lb (70.8 kg)   SpO2 95%   BMI 25.18 kg/m  Wt Readings from Last 3 Encounters:  03/28/22 156 lb (70.8 kg)  03/13/22 154 lb 6 oz (70 kg)  12/15/21 150 lb 3.2 oz (68.1 kg)    Diabetic Foot Exam - Simple   No data filed    Lab Results  Component Value Date   WBC 5.1 11/28/2021   HGB 14.0 11/28/2021   HCT 41.0 11/28/2021   PLT 139 (L) 11/28/2021   GLUCOSE 89 11/28/2021   CHOL 163 03/24/2021   TRIG 73.0 03/24/2021   HDL 71.70 03/24/2021   LDLCALC 76 03/24/2021   ALT 15 11/28/2021   AST 16 11/28/2021   NA 138 11/28/2021   K 4.6 11/28/2021   CL 103 11/28/2021   CREATININE 1.16 (H) 11/28/2021   BUN 26 (H) 11/28/2021   CO2 31 11/28/2021   TSH 1.50 03/24/2021    HGBA1C 5.1 09/28/2021    Lab Results  Component Value Date   TSH 1.50 03/24/2021   Lab Results  Component Value Date   WBC 5.1 11/28/2021   HGB 14.0 11/28/2021   HCT 41.0 11/28/2021   MCV 91.9 11/28/2021   PLT 139 (L) 11/28/2021   Lab Results  Component Value Date   NA 138 11/28/2021   K 4.6 11/28/2021   CO2 31 11/28/2021   GLUCOSE 89 11/28/2021   BUN 26 (H) 11/28/2021   CREATININE 1.16 (H) 11/28/2021   BILITOT 0.6 11/28/2021   ALKPHOS 59 11/28/2021   AST 16 11/28/2021   ALT 15 11/28/2021   PROT 7.2 11/28/2021   ALBUMIN 4.4 11/28/2021   CALCIUM 9.7 11/28/2021   ANIONGAP 4 (L) 11/28/2021   EGFR 73 04/21/2020   GFR 67.79 09/28/2021   Lab Results  Component Value Date   CHOL 163 03/24/2021   Lab Results  Component Value Date   HDL 71.70 03/24/2021   Lab Results  Component Value Date   LDLCALC 76 03/24/2021   Lab Results  Component Value Date   TRIG 73.0 03/24/2021   Lab Results  Component Value Date   CHOLHDL 2 03/24/2021   Lab Results  Component Value Date   HGBA1C 5.1 09/28/2021      Assessment & Plan:  Colonoscopy: Last completed on 02/24/2015. Impression: -Non-thrombosed external hemorrhoids found on perianal exam. -One 6 mm polyp in the cecum, removed with a hot snare. Resected and retrieved. -Diverticulosis in the sigmoid colon. -The distal verge and anal verge are normal on retroflexion view. -The examination was otherwise normal. Repeat in 5 years.  DEXA: Last completed on 04/01/2020. The BMD measured at AP Spine L1-L4 is 0.989 g/cm2 with a T-score of -1.6. Repeat in 2-5 years. This patient is considered OSTEOPENIC according to [facility]) criteria.  Mammogram: Last completed on 11/03/2021 with no mammographic evidence of malignancy. Repeat in 1-2 years.  Pap Smear: Last completed on 10/06/2020 with normal results. Repeat in 3-5 years.  Advanced Directives: Encouraged patient to complete advanced care planning  documents.  Healthy Lifestyle: Encouraged 6-8 hours of sleep, heart healthy diet, 60-80 oz of non-alcohol/non-caffeinated fluids, and minimum of 4000 steps daily.  Immunizations: Encouraged Shingles and annual COVID-19/Flu vaccinations.   Asthma/Allergies: Recommended otc Pepcid and Zyrtec prn.   Itching/Yeast: Recommended witch-hazel to manage itching beneath left breast.  Labs: Routine blood  work ordered today.  Problem List Items Addressed This Visit     Breast cancer in female [facility]) - Primary    Following with oncology      Elevated sed rate   Relevant Orders   Sedimentation rate   History of COVID-19    Had COVID in October from a family wedding. Is fully recovered      Hyperglycemia    hgba1c acceptable, minimize simple carbs. Increase exercise as tolerated.        Relevant Orders   CBC with Differential/Platelet   Comprehensive metabolic panel   Lipid panel   Hemoglobin A1c   Hypothyroidism    On Levothyroxine, continue to monitor       Relevant Orders   Lipid panel   TSH   Preventative health care    Patient encouraged to maintain heart healthy diet, regular exercise, adequate sleep. Consider daily probiotics. Take medications as prescribed. Labs reviewed. . Following with oncology for mgm last one 10/2021, Colonoscopy 2017 repeat in 5 years. Pap 2022 ASCUS referred to OB/GYN      Relevant Orders   Lipid panel   No orders of the defined types were placed in this encounter.  I, Penni Homans, MD, personally preformed the services described in this documentation.  All medical record entries made by the scribe were at my direction and in my presence.  I have reviewed the chart and discharge instructions (if applicable) and agree that the record reflects my personal performance and is accurate and complete. 03/28/2022  I,Mohammed Iqbal,acting as a scribe for Penni Homans, MD.,have documented all relevant documentation on the behalf of Penni Homans, MD,as  directed by  Penni Homans, MD while in the presence of Penni Homans, MD.  Penni Homans, MD

## 2022-04-07 ENCOUNTER — Other Ambulatory Visit: Payer: Self-pay

## 2022-04-09 ENCOUNTER — Other Ambulatory Visit: Payer: Self-pay

## 2022-04-09 ENCOUNTER — Emergency Department (HOSPITAL_BASED_OUTPATIENT_CLINIC_OR_DEPARTMENT_OTHER): Payer: BC Managed Care – PPO

## 2022-04-09 ENCOUNTER — Inpatient Hospital Stay (HOSPITAL_BASED_OUTPATIENT_CLINIC_OR_DEPARTMENT_OTHER)
Admission: EM | Admit: 2022-04-09 | Discharge: 2022-04-14 | DRG: 601 | Disposition: A | Discharge status: Home / Self Care | Payer: BC Managed Care – PPO | Attending: Internal Medicine | Admitting: Internal Medicine

## 2022-04-09 ENCOUNTER — Encounter (HOSPITAL_BASED_OUTPATIENT_CLINIC_OR_DEPARTMENT_OTHER): Payer: Self-pay | Admitting: Urology

## 2022-04-09 DIAGNOSIS — N61 Mastitis without abscess: Principal | ICD-10-CM | POA: Diagnosis present

## 2022-04-09 DIAGNOSIS — R519 Headache, unspecified: Secondary | ICD-10-CM | POA: Diagnosis present

## 2022-04-09 DIAGNOSIS — D696 Thrombocytopenia, unspecified: Secondary | ICD-10-CM

## 2022-04-09 DIAGNOSIS — Z818 Family history of other mental and behavioral disorders: Secondary | ICD-10-CM | POA: Diagnosis not present

## 2022-04-09 DIAGNOSIS — Z801 Family history of malignant neoplasm of trachea, bronchus and lung: Secondary | ICD-10-CM | POA: Diagnosis not present

## 2022-04-09 DIAGNOSIS — Z79899 Other long term (current) drug therapy: Secondary | ICD-10-CM

## 2022-04-09 DIAGNOSIS — Z810 Family history of intellectual disabilities: Secondary | ICD-10-CM

## 2022-04-09 DIAGNOSIS — Z8041 Family history of malignant neoplasm of ovary: Secondary | ICD-10-CM | POA: Diagnosis not present

## 2022-04-09 DIAGNOSIS — Z808 Family history of malignant neoplasm of other organs or systems: Secondary | ICD-10-CM | POA: Diagnosis not present

## 2022-04-09 DIAGNOSIS — Z8261 Family history of arthritis: Secondary | ICD-10-CM | POA: Diagnosis not present

## 2022-04-09 DIAGNOSIS — Z79810 Long term (current) use of selective estrogen receptor modulators (SERMs): Secondary | ICD-10-CM

## 2022-04-09 DIAGNOSIS — E039 Hypothyroidism, unspecified: Secondary | ICD-10-CM | POA: Diagnosis present

## 2022-04-09 DIAGNOSIS — Z853 Personal history of malignant neoplasm of breast: Secondary | ICD-10-CM | POA: Diagnosis not present

## 2022-04-09 DIAGNOSIS — F419 Anxiety disorder, unspecified: Secondary | ICD-10-CM | POA: Diagnosis not present

## 2022-04-09 DIAGNOSIS — L089 Local infection of the skin and subcutaneous tissue, unspecified: Secondary | ICD-10-CM | POA: Diagnosis not present

## 2022-04-09 DIAGNOSIS — J45909 Unspecified asthma, uncomplicated: Secondary | ICD-10-CM | POA: Insufficient documentation

## 2022-04-09 DIAGNOSIS — Z8052 Family history of malignant neoplasm of bladder: Secondary | ICD-10-CM

## 2022-04-09 DIAGNOSIS — Z888 Allergy status to other drugs, medicaments and biological substances status: Secondary | ICD-10-CM

## 2022-04-09 DIAGNOSIS — R911 Solitary pulmonary nodule: Secondary | ICD-10-CM | POA: Diagnosis not present

## 2022-04-09 DIAGNOSIS — F319 Bipolar disorder, unspecified: Secondary | ICD-10-CM | POA: Diagnosis present

## 2022-04-09 DIAGNOSIS — Z820 Family history of epilepsy and other diseases of the nervous system: Secondary | ICD-10-CM | POA: Diagnosis not present

## 2022-04-09 DIAGNOSIS — N6489 Other specified disorders of breast: Secondary | ICD-10-CM | POA: Diagnosis not present

## 2022-04-09 DIAGNOSIS — Z803 Family history of malignant neoplasm of breast: Secondary | ICD-10-CM

## 2022-04-09 DIAGNOSIS — Z8249 Family history of ischemic heart disease and other diseases of the circulatory system: Secondary | ICD-10-CM

## 2022-04-09 DIAGNOSIS — R079 Chest pain, unspecified: Secondary | ICD-10-CM

## 2022-04-09 DIAGNOSIS — Z923 Personal history of irradiation: Secondary | ICD-10-CM | POA: Diagnosis not present

## 2022-04-09 LAB — COMPREHENSIVE METABOLIC PANEL
ALT: 15 U/L (ref 0–44)
AST: 17 U/L (ref 15–41)
Albumin: 3.6 g/dL (ref 3.5–5.0)
Alkaline Phosphatase: 44 U/L (ref 38–126)
Anion gap: 6 (ref 5–15)
BUN: 16 mg/dL (ref 6–20)
CO2: 27 mmol/L (ref 22–32)
Calcium: 8.7 mg/dL — ABNORMAL LOW (ref 8.9–10.3)
Chloride: 105 mmol/L (ref 98–111)
Creatinine, Ser: 0.97 mg/dL (ref 0.44–1.00)
GFR, Estimated: >60 mL/min (ref >60)
Glucose, Bld: 120 mg/dL — ABNORMAL HIGH (ref 70–99)
Potassium: 3.8 mmol/L (ref 3.5–5.1)
Sodium: 138 mmol/L (ref 135–145)
Total Bilirubin: 0.8 mg/dL (ref 0.3–1.2)
Total Protein: 6.9 g/dL (ref 6.5–8.1)

## 2022-04-09 LAB — CBC WITH DIFFERENTIAL/PLATELET
Abs Immature Granulocytes: 0.03 10*3/uL (ref 0.00–0.07)
Basophils Absolute: 0 10*3/uL (ref 0.0–0.1)
Basophils Relative: 0 %
Eosinophils Absolute: 0 10*3/uL (ref 0.0–0.5)
Eosinophils Relative: 0 %
HCT: 42.4 % (ref 36.0–46.0)
Hemoglobin: 13.8 g/dL (ref 12.0–15.0)
Immature Granulocytes: 0 %
Lymphocytes Relative: 6 %
Lymphs Abs: 0.6 10*3/uL — ABNORMAL LOW (ref 0.7–4.0)
MCH: 30.6 pg (ref 26.0–34.0)
MCHC: 32.5 g/dL (ref 30.0–36.0)
MCV: 94 fL (ref 80.0–100.0)
Monocytes Absolute: 0.4 10*3/uL (ref 0.1–1.0)
Monocytes Relative: 4 %
Neutro Abs: 9.5 10*3/uL — ABNORMAL HIGH (ref 1.7–7.7)
Neutrophils Relative %: 90 %
Platelets: 112 10*3/uL — ABNORMAL LOW (ref 150–400)
RBC: 4.51 MIL/uL (ref 3.87–5.11)
RDW: 12 % (ref 11.5–15.5)
WBC: 10.6 10*3/uL — ABNORMAL HIGH (ref 4.0–10.5)
nRBC: 0 % (ref 0.0–0.2)

## 2022-04-09 LAB — LACTIC ACID, PLASMA: Lactic Acid, Venous: 1.4 mmol/L (ref 0.5–1.9)

## 2022-04-09 LAB — D-DIMER, QUANTITATIVE: D-Dimer, Quant: <0.27 ug/mL-FEU (ref 0.00–0.50)

## 2022-04-09 LAB — TROPONIN I (HIGH SENSITIVITY): Troponin I (High Sensitivity): <2 ng/L (ref <18)

## 2022-04-09 MED ORDER — IOHEXOL 300 MG/ML  SOLN
75.0000 mL | Freq: Once | INTRAMUSCULAR | Status: AC | PRN
  Administered 2022-04-09: 75 mL via INTRAVENOUS

## 2022-04-09 MED ORDER — CEFAZOLIN SODIUM-DEXTROSE 1-4 GM/50ML-% IV SOLN
1.0000 g | Freq: Three times a day (TID) | INTRAVENOUS | Status: DC
  Administered 2022-04-09 – 2022-04-11 (×5): 1 g via INTRAVENOUS
  Filled 2022-04-09 (×6): qty 50

## 2022-04-09 MED ORDER — KETOROLAC TROMETHAMINE 15 MG/ML IJ SOLN
15.0000 mg | Freq: Three times a day (TID) | INTRAMUSCULAR | Status: AC | PRN
  Administered 2022-04-10 (×3): 15 mg via INTRAVENOUS
  Filled 2022-04-09 (×3): qty 1

## 2022-04-09 MED ORDER — KETOROLAC TROMETHAMINE 15 MG/ML IJ SOLN
15.0000 mg | Freq: Once | INTRAMUSCULAR | Status: AC
  Administered 2022-04-09: 15 mg via INTRAVENOUS
  Filled 2022-04-09: qty 1

## 2022-04-09 MED ORDER — HEPARIN SODIUM (PORCINE) 5000 UNIT/ML IJ SOLN
5000.0000 [IU] | Freq: Three times a day (TID) | INTRAMUSCULAR | Status: DC
  Administered 2022-04-09 – 2022-04-13 (×11): 5000 [IU] via SUBCUTANEOUS
  Filled 2022-04-09 (×12): qty 1

## 2022-04-09 MED ORDER — ACETAMINOPHEN 650 MG RE SUPP: 650.0000 mg | Freq: Four times a day (QID) | RECTAL | Status: DC | PRN

## 2022-04-09 MED ORDER — SODIUM CHLORIDE 0.9 % IV BOLUS
1000.0000 mL | Freq: Once | INTRAVENOUS | Status: AC
  Administered 2022-04-09: 1000 mL via INTRAVENOUS

## 2022-04-09 MED ORDER — ACETAMINOPHEN 325 MG PO TABS
650.0000 mg | ORAL_TABLET | Freq: Four times a day (QID) | ORAL | Status: DC | PRN
  Administered 2022-04-09 – 2022-04-11 (×3): 650 mg via ORAL
  Filled 2022-04-09 (×3): qty 2

## 2022-04-09 MED ORDER — HYDROCODONE-ACETAMINOPHEN 5-325 MG PO TABS
1.0000 | ORAL_TABLET | Freq: Once | ORAL | Status: AC
  Administered 2022-04-09: 1 via ORAL
  Filled 2022-04-09: qty 1

## 2022-04-09 MED ORDER — MORPHINE SULFATE (PF) 2 MG/ML IV SOLN
2.0000 mg | Freq: Once | INTRAVENOUS | Status: AC
  Administered 2022-04-09: 2 mg via INTRAVENOUS
  Filled 2022-04-09: qty 1

## 2022-04-09 MED ORDER — CEFAZOLIN SODIUM-DEXTROSE 2-4 GM/100ML-% IV SOLN
2.0000 g | Freq: Once | INTRAVENOUS | Status: AC
  Administered 2022-04-09: 2 g via INTRAVENOUS
  Filled 2022-04-09: qty 100

## 2022-04-09 NOTE — Plan of Care (Signed)
[facility] will assume care on arrival to accepting facility. Until arrival, care as per EDP. However, [facility] available 24/7 for questions and assistance.  Nursing staff, please page [facility] Admits and Consults ([phone]) as soon as the patient arrives to the hospital.   

## 2022-04-09 NOTE — ED Triage Notes (Signed)
Redness, pain and swelling to left breast that started today  H/o breast CA in 2021 with radiation, has small pin point area that has never healed under left nipple

## 2022-04-09 NOTE — ED Notes (Signed)
Called [facility] for transport at 3:58.

## 2022-04-09 NOTE — ED Notes (Signed)
Report given to RN Jason Fila from [place].

## 2022-04-09 NOTE — H&P (Signed)
History and Physical    Cheyenne Garner [id number] DOB: 1965/07/01 DOA: 04/09/2022  PCP: Mosie Lukes, MD  Patient coming from: [facility] ED  Chief Complaint: Left breast redness and swelling  HPI: Cheyenne Garner is a 57 y.o. female with medical history significant of breast cancer (invasive ductal carcinoma) status post bilateral lumpectomies 01/2020 and completed adjuvant radiation 04/2020, currently on tamoxifen.  Also has history of asthma, hypothyroidism, anxiety, bipolar disorder.  She presented to the ED today with redness, pain, and swelling of her left breast concerning for cellulitis.  Vital signs stable.  Labs significant for WBC 10.6, platelet count 112k, no significant electrolyte abnormalities, lactic acid normal. CT chest with contrast showing minimal subsegmental atelectasis or scarring involving the lingular portion of the left upper lobe and stable 5 mm right middle lobe nodule. Patient received Norco, Toradol, morphine, cefazolin, and 1 L normal saline bolus in the ED.   Patient states she had breast cancer and received radiation in 2021.  States since then there is an area next to her left nipple which is slightly cracked has not completely healed.  For the past few weeks she has had noticed clear drainage from this area and it has been itching.  Today she noticed increasing redness and swelling of her left breast and it is quite painful.  No fevers at home.  She has had intermittent central chest pain as well over the past few days and reports recent long distance travel.  Denies shortness of breath or leg pain/swelling.  Chest pain is worse when taking deep breaths.  Review of Systems:  Review of Systems  All other systems reviewed and are negative.   Past Medical History:  Diagnosis Date   Allergies    Anxiety    Asthma    cough variant asthma developed in last couple of years   Bipolar 1 disorder ([place])    Breast cancer ([place]) 02/12/2020   left lumpectomy [facility]  w/radiation no chemo   Cervical cancer screening 09/12/2016   Colon polyp 09/12/2016   Family history of bladder cancer    Family history of breast cancer    Family history of ovarian cancer    Family history of prostate cancer    Hx of colonic polyp 09/12/2016   Colonoscopy 2017 with 1 small polyp, done by Dr [name], repeat colonoscopy in 5 years, 2022   Hypothyroidism    Personal history of radiation therapy    Preventative health care 07/23/2015   Thyroid disease     Past Surgical History:  Procedure Laterality Date   APPENDECTOMY     BREAST LUMPECTOMY WITH RADIOACTIVE SEED LOCALIZATION Bilateral 02/12/2020   Procedure: BILATERAL BREAST LUMPECTOMY WITH RADIOACTIVE SEED LOCALIZATION;  Surgeon: Jovita Kussmaul, MD;  Location: [place];  Service: General;  Laterality: Bilateral;   RE-EXCISION OF BREAST LUMPECTOMY Left 03/04/2020   Procedure: LEFT BREAST MARGIN REEXCISION;  Surgeon: Jovita Kussmaul, MD;  Location: [place];  Service: General;  Laterality: Left;   SENTINEL NODE BIOPSY Left 03/04/2020   Procedure: SENTINEL NODE BIOPSY;  Surgeon: Jovita Kussmaul, MD;  Location: [place];  Service: General;  Laterality: Left;   SKIN BIOPSY     back moles removed (precancer?)     reports that she has never smoked. She has never used smokeless tobacco. She reports current alcohol use. She reports that she does not use drugs.  Allergies  Allergen Reactions   Gentamycin [Gentamicin]  Reacted to eye ointment, swelling red painful eyes    Family History  Problem Relation Age of Onset   Arthritis Mother    Dementia Mother        alzheimers   Cancer Father        bladder   Hypertension Father    Heart disease Father        MI, s/p triple bypass at age 78   Neuropathy Father        toxic peripheral    Other Father        peripheral neuropathy   Cholelithiasis Sister    Aortic aneurysm Sister 55   Aneurysm Sister        descending  aortic    Mental retardation Sister        ADD, schizoaffective d/o   Obesity Sister    Depression Brother    Arthritis Brother        back disease   Prostate cancer Paternal Uncle        spread to bladder, d. 60s/70s   Dementia Maternal Grandmother    Osteoarthritis Maternal Grandmother    Macular degeneration Maternal Grandfather    Osteoarthritis Maternal Grandfather    Congestive Heart Failure Paternal Grandmother    Cancer Paternal Grandfather        young, lung cancer?, heavy smoker   Ovarian cancer Cousin 74       maternal second cousin (MGF's great-niece)   Breast cancer Cousin 83       paternal first cousin   Cancer Niece 8       soft tissue sarcoma    Prior to Admission medications   Medication Sig Start Date End Date Taking? Authorizing Provider  albuterol (VENTOLIN HFA) 108 (90 Base) MCG/ACT inhaler Inhale 2 puffs into the lungs every 6 (six) hours as needed for wheezing or shortness of breath. 03/17/22   Mosie Lukes, MD  CALCIUM PO Take by mouth daily. Bone Up    [provider]  Cholecalciferol (VITAMIN D-3) 125 MCG (5000 UT) TABS Take 1 tablet by mouth daily at 6 (six) AM.    [provider]  lithium 300 MG tablet Take 300 mg by mouth 2 (two) times daily.    [provider]  OVER THE COUNTER MEDICATION daily. Iodoral    [provider]  OVER THE COUNTER MEDICATION daily. ashwagandha    [provider]  Probiotic Product (PROBIOTIC-10 PO) Take 1 capsule by mouth daily.    [provider]  tamoxifen (NOLVADEX) 10 MG tablet Take 1 tablet (10 mg total) by mouth daily. 11/28/21   Benay Pike, MD  thyroid (NP THYROID) 90 MG tablet Take 1 tablet (90 mg total) by mouth daily. 01/20/22   Mosie Lukes, MD  Zinc 50 MG TABS Take 50 mg by mouth daily.    [provider]    Physical Exam: Vitals:   04/09/22 1243 04/09/22 1245 04/09/22 1456 04/09/22 1517  BP:  120/69 120/74   Pulse:  76 98   Resp:  18 19    Temp:    98.4 F (36.9 C)  TempSrc:    Oral  SpO2:  100% 99%   Weight: 70.7 kg     Height: 5\' 6"  (1.676 m)       Physical Exam Vitals reviewed.  Constitutional:      General: She is not in acute distress. HENT:     Head: Normocephalic and atraumatic.  Eyes:  Extraocular Movements: Extraocular movements intact.  Cardiovascular:     Rate and Rhythm: Normal rate and regular rhythm.     Pulses: Normal pulses.  Pulmonary:     Effort: Pulmonary effort is normal. No respiratory distress.     Breath sounds: Normal breath sounds. No wheezing or rales.  Abdominal:     General: Bowel sounds are normal. There is no distension.     Palpations: Abdomen is soft.     Tenderness: There is no abdominal tenderness.  Musculoskeletal:     Cervical back: Normal range of motion.     Right lower leg: No edema.     Left lower leg: No edema.     Comments: Chaperone present at bedside (patient's nurse).  Left breast erythematous, swollen, and warm to touch.  Skin:    General: Skin is warm and dry.  Neurological:     General: No focal deficit present.     Mental Status: She is alert and oriented to person, place, and time.     Labs on Admission: I have personally reviewed following labs and imaging studies  CBC: Recent Labs  Lab 04/09/22 1330  WBC 10.6*  NEUTROABS 9.5*  HGB 13.8  HCT 42.4  MCV 94.0  PLT [id number]*   Basic Metabolic Panel: Recent Labs  Lab 04/09/22 1330  NA 138  K 3.8  CL 105  CO2 27  GLUCOSE 120*  BUN 16  CREATININE 0.97  CALCIUM 8.7*   GFR: Estimated Creatinine Clearance: 59.9 mL/min (by C-G formula based on SCr of 0.97 mg/dL). Liver Function Tests: Recent Labs  Lab 04/09/22 1330  AST 17  ALT 15  ALKPHOS 44  BILITOT 0.8  PROT 6.9  ALBUMIN 3.6   No results for input(s): "LIPASE", "AMYLASE" in the last 168 hours. No results for input(s): "AMMONIA" in the last 168 hours. Coagulation Profile: No results for input(s): "INR", "PROTIME" in the last 168  hours. Cardiac Enzymes: No results for input(s): "CKTOTAL", "CKMB", "CKMBINDEX", "TROPONINI" in the last 168 hours. BNP (last 3 results) No results for input(s): "PROBNP" in the last 8760 hours. HbA1C: No results for input(s): "HGBA1C" in the last 72 hours. CBG: No results for input(s): "GLUCAP" in the last 168 hours. Lipid Profile: No results for input(s): "CHOL", "HDL", "LDLCALC", "TRIG", "CHOLHDL", "LDLDIRECT" in the last 72 hours. Thyroid Function Tests: No results for input(s): "TSH", "T4TOTAL", "FREET4", "T3FREE", "THYROIDAB" in the last 72 hours. Anemia Panel: No results for input(s): "VITAMINB12", "FOLATE", "FERRITIN", "TIBC", "IRON", "RETICCTPCT" in the last 72 hours. Urine analysis:    Component Value Date/Time   COLORURINE YELLOW 06/25/2014 1850   APPEARANCEUR CLEAR 06/25/2014 1850   LABSPEC 1.029 06/25/2014 1850   PHURINE 5.5 06/25/2014 1850   GLUCOSEU NEGATIVE 06/25/2014 1850   HGBUR TRACE (A) 06/25/2014 1850   BILIRUBINUR NEGATIVE 06/25/2014 1850   KETONESUR NEGATIVE 06/25/2014 1850   PROTEINUR NEGATIVE 06/25/2014 1850   UROBILINOGEN 0.2 06/25/2014 1850   NITRITE NEGATIVE 06/25/2014 1850   LEUKOCYTESUR SMALL (A) 06/25/2014 1850    Radiological Exams on Admission: CT Chest W Contrast  Result Date: 04/09/2022 CLINICAL DATA:  Soft tissue infection.  History of breast cancer. EXAM: CT CHEST WITH CONTRAST TECHNIQUE: Multidetector CT imaging of the chest was performed during intravenous contrast administration. RADIATION DOSE REDUCTION: This exam was performed according to the departmental dose-optimization program which includes automated exposure control, adjustment of the mA and/or kV according to patient size and/or use of iterative reconstruction technique. CONTRAST:  42mL OMNIPAQUE IOHEXOL 300  MG/ML  SOLN COMPARISON:  April 21, 2020. FINDINGS: Cardiovascular: No significant vascular findings. Normal heart size. No pericardial effusion. Mediastinum/Nodes: No enlarged  mediastinal, hilar, or axillary lymph nodes. Thyroid gland, trachea, and esophagus demonstrate no significant findings. Lungs/Pleura: No pneumothorax or pleural effusion is noted. Stable 5 mm nodule is noted in right middle lobe which can be considered benign at this point with no further follow-up required. Mild biapical scarring is noted. Minimal subsegmental atelectasis or scarring is seen involving the lingular portion of the left upper lobe. Upper Abdomen: No acute abnormality. Musculoskeletal: No chest wall abnormality. No acute or significant osseous findings. IMPRESSION: Minimal subsegmental atelectasis or scarring is seen involving the lingular portion of the left upper lobe. Stable 5 mm right middle lobe nodule is noted which can be considered benign at this point with no further follow-up required. Electronically Signed   By: Marijo Conception M.D.   On: 04/09/2022 15:12    Assessment and Plan  Left breast cellulitis Patient with history of breast cancer treated with radiation in 2022 presenting with redness, pain, and swelling of her left breast x 1 day. No fever, significant leukocytosis, or signs of sepsis.  CT chest without evidence of necrotizing soft tissue infection.  Continue cefazolin and pain management.  Trend WBC count.  If no clinical improvement within 48 to 72 hours, then consider further imaging with ultrasound to assess for abscess and reevaluation of antibiotic coverage.  Also if not improving then inflammatory breast cancer should be considered.  Patient is requesting to speak to her oncologist Dr. Chryl Heck in the morning, please consult oncology in a.m.  Chest pain Possibly related to breast pathology but pain is worse when taking deep breaths and she is reporting recent long distance travel.  No signs or symptoms of DVT.  PE less likely given no tachycardia or hypoxia.  Stat EKG, troponin, and D-dimer.  History of breast cancer (invasive ductal carcinoma) status post bilateral  lumpectomies 01/2020 and completed adjuvant radiation 04/2020 Currently on tamoxifen.  As mentioned above, if no clinical improvement with antibiotics within the next 42 to 72 hours, then inflammatory breast cancer should be considered.  Patient is requesting oncology consultation in the morning.  Mild thrombocytopenia No signs of bleeding.  Continue to monitor CBC.  Asthma: Stable, no signs of acute exacerbation. Hypothyroidism Anxiety, bipolar disorder Pharmacy med rec pending.  DVT prophylaxis: SQ Heparin Code Status: Full Code (discussed with the patient) Family Communication: Patient's husband at bedside. Level of care: Med-Surg Admission status: It is my clinical opinion that referral for OBSERVATION is reasonable and necessary in this patient based on the above information provided. The aforementioned taken together are felt to place the patient at high risk for further clinical deterioration. However, it is anticipated that the patient may be medically stable for discharge from the hospital within 24 to 48 hours.   Shela Leff MD [facility]  If 7PM-7AM, please contact night-coverage [email or web address]  04/09/2022, 5:56 PM

## 2022-04-09 NOTE — ED Provider Notes (Signed)
[facility] Provider Note   CSN: [id number] Arrival date & time: 04/09/22  1229     History  Chief Complaint  Patient presents with   Breast Problem    Cheyenne Garner is a 57 y.o. female, history of invasive ductal cell carcinoma, who presents to the ED secondary to left breast redness and swelling that is been going on for the last day.  She states that after she had her chemo and radiation, she had some dimpling under the left breast after the tissue was taken, has had like almost a whitehead there, for several months, and has been pinching it and getting the head out.  Notes that she has noticed that her nipple has been a little bit more red and swollen, the last few months, and has been putting vitamin E on it without relief of symptoms.  Woke up this morning and noticed some left breast swelling and redness, that rapidly worsened over the past 4 to 5 hours.  States that she is not having fevers but does endorse having chills.  States the pain radiates to her back.  Denies any shortness of breath but states that it hurts to move or take deep breaths.  Is currently on tamoxifen.  Postmenopausal.     Home Medications Prior to Admission medications   Medication Sig Start Date End Date Taking? Authorizing Provider  albuterol (VENTOLIN HFA) 108 (90 Base) MCG/ACT inhaler Inhale 2 puffs into the lungs every 6 (six) hours as needed for wheezing or shortness of breath. 03/17/22   Mosie Lukes, MD  CALCIUM PO Take by mouth daily. Bone Up    [provider]  Cholecalciferol (VITAMIN D-3) 125 MCG (5000 UT) TABS Take 1 tablet by mouth daily at 6 (six) AM.    [provider]  lithium 300 MG tablet Take 300 mg by mouth 2 (two) times daily.    [provider]  OVER THE COUNTER MEDICATION daily. Iodoral    [provider]  OVER THE COUNTER MEDICATION daily. ashwagandha    [provider]  Probiotic Product  (PROBIOTIC-10 PO) Take 1 capsule by mouth daily.    [provider]  tamoxifen (NOLVADEX) 10 MG tablet Take 1 tablet (10 mg total) by mouth daily. 11/28/21   Benay Pike, MD  thyroid (NP THYROID) 90 MG tablet Take 1 tablet (90 mg total) by mouth daily. 01/20/22   Mosie Lukes, MD  Zinc 50 MG TABS Take 50 mg by mouth daily.    [provider]      Allergies    Gentamycin [gentamicin]    Review of Systems   Review of Systems  Constitutional:  Negative for fever.  Skin:  Positive for rash.    Physical Exam Updated Vital Signs BP 120/74   Pulse 98   Temp 98.4 F (36.9 C) (Oral)   Resp 19   Ht 5\' 6"  (1.676 m)   Wt 70.7 kg   SpO2 99%   BMI 25.16 kg/m  Physical Exam Vitals and nursing note reviewed.  Constitutional:      General: She is not in acute distress.    Appearance: She is well-developed.  HENT:     Head: Normocephalic and atraumatic.  Eyes:     Conjunctiva/sclera: Conjunctivae normal.  Cardiovascular:     Rate and Rhythm: Normal rate and regular rhythm.     Heart sounds: No murmur heard. Pulmonary:     Effort: Pulmonary  effort is normal. No respiratory distress.     Breath sounds: Normal breath sounds.  Abdominal:     Palpations: Abdomen is soft.     Tenderness: There is no abdominal tenderness.  Musculoskeletal:        General: No swelling.     Cervical back: Neck supple.  Skin:    General: Skin is warm and dry.     Capillary Refill: Capillary refill takes less than 2 seconds.     Comments: Diffuse left-sided chest wall tenderness, and exquisite tenderness, and redness (18x20cm) and warmth to the left breast.  See picture for more information.  Neurological:     Mental Status: She is alert.  Psychiatric:        Mood and Affect: Mood normal.     ED Results / Procedures / Treatments   Labs (all labs ordered are listed, but only abnormal results are displayed) Labs Reviewed  COMPREHENSIVE METABOLIC PANEL - Abnormal; Notable for the  following components:      Result Value   Glucose, Bld 120 (*)    Calcium 8.7 (*)    All other components within normal limits  CBC WITH DIFFERENTIAL/PLATELET - Abnormal; Notable for the following components:   WBC 10.6 (*)    Platelets 112 (*)    Neutro Abs 9.5 (*)    Lymphs Abs 0.6 (*)    All other components within normal limits  LACTIC ACID, PLASMA    EKG None  Radiology CT Chest W Contrast  Result Date: 04/09/2022 CLINICAL DATA:  Soft tissue infection.  History of breast cancer. EXAM: CT CHEST WITH CONTRAST TECHNIQUE: Multidetector CT imaging of the chest was performed during intravenous contrast administration. RADIATION DOSE REDUCTION: This exam was performed according to the departmental dose-optimization program which includes automated exposure control, adjustment of the mA and/or kV according to patient size and/or use of iterative reconstruction technique. CONTRAST:  82mL OMNIPAQUE IOHEXOL 300 MG/ML  SOLN COMPARISON:  April 21, 2020. FINDINGS: Cardiovascular: No significant vascular findings. Normal heart size. No pericardial effusion. Mediastinum/Nodes: No enlarged mediastinal, hilar, or axillary lymph nodes. Thyroid gland, trachea, and esophagus demonstrate no significant findings. Lungs/Pleura: No pneumothorax or pleural effusion is noted. Stable 5 mm nodule is noted in right middle lobe which can be considered benign at this point with no further follow-up required. Mild biapical scarring is noted. Minimal subsegmental atelectasis or scarring is seen involving the lingular portion of the left upper lobe. Upper Abdomen: No acute abnormality. Musculoskeletal: No chest wall abnormality. No acute or significant osseous findings. IMPRESSION: Minimal subsegmental atelectasis or scarring is seen involving the lingular portion of the left upper lobe. Stable 5 mm right middle lobe nodule is noted which can be considered benign at this point with no further follow-up required. Electronically  Signed   By: Marijo Conception M.D.   On: 04/09/2022 15:12    Procedures Procedures    Medications Ordered in ED Medications  ceFAZolin (ANCEF) IVPB 2g/100 mL premix (0 g Intravenous Stopped 04/09/22 1500)  sodium chloride 0.9 % bolus 1,000 mL (0 mLs Intravenous Stopped 04/09/22 1500)  ketorolac (TORADOL) 15 MG/ML injection 15 mg (15 mg Intravenous Given 04/09/22 1325)  morphine (PF) 2 MG/ML injection 2 mg (2 mg Intravenous Given 04/09/22 1327)  iohexol (OMNIPAQUE) 300 MG/ML solution 75 mL (75 mLs Intravenous Contrast Given 04/09/22 1409)  HYDROcodone-acetaminophen (NORCO/VICODIN) 5-325 MG per tablet 1 tablet (1 tablet Oral Given 04/09/22 1516)    ED Course/ Medical Decision Making/ A&P  Medical Decision Making Patient is a 57 year old female, here for rash on her left breast that occurred in the last 4 hours.  She denies any fevers or chills, states that it is very exquisitely tender.  Has not take any history of breast cancer.  We will obtain a CT scan, evaluate for possible breast abscess, versus metastatic disease, as she is admission from breast cancer.  Additionally will obtain CBC, CMP for further evaluation.  Amount and/or Complexity of Data Reviewed Labs: ordered.    Details: Mild leukocytosis Radiology: ordered.    Details: CT shows no acute findings Discussion of management or test interpretation with external provider(s): Given the fast progression of the cellulitis, severity of the pain, we will admit for IV antibiotics.  Given 1 dose of cefazolin I spoke with Dr. Marlowe Sax, she accepts admission of the patient.  Patient agreeable.  Risk Prescription drug management. Decision regarding hospitalization.  Medical Impression(s) / ED Diagnoses Final diagnoses:  Cellulitis of left breast    Rx / DC Orders ED Discharge Orders     None         Osvaldo Shipper, PA 04/09/22 1647    Margette Fast, MD 04/13/22 1559

## 2022-04-10 ENCOUNTER — Inpatient Hospital Stay (HOSPITAL_COMMUNITY): Payer: BC Managed Care – PPO

## 2022-04-10 DIAGNOSIS — Z79899 Other long term (current) drug therapy: Secondary | ICD-10-CM | POA: Diagnosis not present

## 2022-04-10 DIAGNOSIS — F419 Anxiety disorder, unspecified: Secondary | ICD-10-CM | POA: Diagnosis present

## 2022-04-10 DIAGNOSIS — Z853 Personal history of malignant neoplasm of breast: Secondary | ICD-10-CM | POA: Diagnosis not present

## 2022-04-10 DIAGNOSIS — Z8052 Family history of malignant neoplasm of bladder: Secondary | ICD-10-CM | POA: Diagnosis not present

## 2022-04-10 DIAGNOSIS — R519 Headache, unspecified: Secondary | ICD-10-CM | POA: Diagnosis present

## 2022-04-10 DIAGNOSIS — J45909 Unspecified asthma, uncomplicated: Secondary | ICD-10-CM | POA: Diagnosis present

## 2022-04-10 DIAGNOSIS — Z923 Personal history of irradiation: Secondary | ICD-10-CM | POA: Diagnosis not present

## 2022-04-10 DIAGNOSIS — Z79810 Long term (current) use of selective estrogen receptor modulators (SERMs): Secondary | ICD-10-CM | POA: Diagnosis not present

## 2022-04-10 DIAGNOSIS — Z818 Family history of other mental and behavioral disorders: Secondary | ICD-10-CM | POA: Diagnosis not present

## 2022-04-10 DIAGNOSIS — Z8041 Family history of malignant neoplasm of ovary: Secondary | ICD-10-CM | POA: Diagnosis not present

## 2022-04-10 DIAGNOSIS — F319 Bipolar disorder, unspecified: Secondary | ICD-10-CM | POA: Diagnosis present

## 2022-04-10 DIAGNOSIS — D696 Thrombocytopenia, unspecified: Secondary | ICD-10-CM | POA: Diagnosis present

## 2022-04-10 DIAGNOSIS — Z810 Family history of intellectual disabilities: Secondary | ICD-10-CM | POA: Diagnosis not present

## 2022-04-10 DIAGNOSIS — Z8249 Family history of ischemic heart disease and other diseases of the circulatory system: Secondary | ICD-10-CM | POA: Diagnosis not present

## 2022-04-10 DIAGNOSIS — Z8261 Family history of arthritis: Secondary | ICD-10-CM | POA: Diagnosis not present

## 2022-04-10 DIAGNOSIS — Z888 Allergy status to other drugs, medicaments and biological substances status: Secondary | ICD-10-CM | POA: Diagnosis not present

## 2022-04-10 DIAGNOSIS — E039 Hypothyroidism, unspecified: Secondary | ICD-10-CM | POA: Diagnosis present

## 2022-04-10 DIAGNOSIS — Z820 Family history of epilepsy and other diseases of the nervous system: Secondary | ICD-10-CM | POA: Diagnosis not present

## 2022-04-10 DIAGNOSIS — Z803 Family history of malignant neoplasm of breast: Secondary | ICD-10-CM | POA: Diagnosis not present

## 2022-04-10 DIAGNOSIS — Z808 Family history of malignant neoplasm of other organs or systems: Secondary | ICD-10-CM | POA: Diagnosis not present

## 2022-04-10 DIAGNOSIS — N61 Mastitis without abscess: Secondary | ICD-10-CM | POA: Diagnosis not present

## 2022-04-10 DIAGNOSIS — Z801 Family history of malignant neoplasm of trachea, bronchus and lung: Secondary | ICD-10-CM | POA: Diagnosis not present

## 2022-04-10 LAB — CBC
HCT: 39.2 % (ref 36.0–46.0)
Hemoglobin: 12.8 g/dL (ref 12.0–15.0)
MCH: 30.4 pg (ref 26.0–34.0)
MCHC: 32.7 g/dL (ref 30.0–36.0)
MCV: 93.1 fL (ref 80.0–100.0)
Platelets: 93 10*3/uL — ABNORMAL LOW (ref 150–400)
RBC: 4.21 MIL/uL (ref 3.87–5.11)
RDW: 12.4 % (ref 11.5–15.5)
WBC: 11.8 10*3/uL — ABNORMAL HIGH (ref 4.0–10.5)
nRBC: 0 % (ref 0.0–0.2)

## 2022-04-10 LAB — HIV ANTIBODY (ROUTINE TESTING W REFLEX): HIV Screen 4th Generation wRfx: NONREACTIVE

## 2022-04-10 MED ORDER — SODIUM CHLORIDE 0.9 % IV BOLUS
500.0000 mL | Freq: Once | INTRAVENOUS | Status: AC
  Administered 2022-04-10: 500 mL via INTRAVENOUS

## 2022-04-10 MED ORDER — HYDROCODONE-ACETAMINOPHEN 5-325 MG PO TABS
1.0000 | ORAL_TABLET | Freq: Four times a day (QID) | ORAL | Status: DC | PRN
  Administered 2022-04-10: 1 via ORAL
  Filled 2022-04-10: qty 1

## 2022-04-10 MED ORDER — HYDROCODONE-ACETAMINOPHEN 5-325 MG PO TABS
1.0000 | ORAL_TABLET | ORAL | Status: DC | PRN
  Administered 2022-04-10 – 2022-04-11 (×4): 1 via ORAL
  Filled 2022-04-10 (×4): qty 1

## 2022-04-10 MED ORDER — GUAIFENESIN 100 MG/5ML PO LIQD
5.0000 mL | ORAL | Status: DC | PRN
  Administered 2022-04-10: 5 mL via ORAL
  Filled 2022-04-10: qty 10

## 2022-04-10 MED ORDER — KETOROLAC TROMETHAMINE 15 MG/ML IJ SOLN
15.0000 mg | Freq: Once | INTRAMUSCULAR | Status: AC | PRN
  Administered 2022-04-11: 15 mg via INTRAVENOUS
  Filled 2022-04-10: qty 1

## 2022-04-10 NOTE — Plan of Care (Signed)
  Problem: Pain Managment: Goal: General experience of comfort will improve Outcome: Progressing   

## 2022-04-10 NOTE — Progress Notes (Signed)
PROGRESS NOTE    [place] Garner  [id number] DOB: 1965/02/17 DOA: 04/09/2022  PCP: Mosie Lukes, MD    Brief Narrative: This 57 y.o. female with PMH  significant of breast cancer (invasive ductal carcinoma) status post bilateral lumpectomies in 01/2020 and completed adjuvant radiation in 04/2020, currently on tamoxifen.  Also has history of asthma, hypothyroidism, anxiety, bipolar disorder. She presented to the ED with redness, pain, and swelling of her left breast concerning for cellulitis.  CT chest with contrast showing minimal subsegmental atelectasis or scarring involving the lingular portion of the left upper lobe and stable 5 mm right middle lobe nodule. Patient received Norco, Toradol, morphine, cefazolin, and 1 L normal saline bolus in the ED.    Patient states she had breast cancer and received radiation in 2021.  States since then there is an area next to her left nipple which is slightly cracked has not completely healed.  For the past few weeks she has had noticed clear drainage from this area and it has been itching.  Today she noticed increasing redness and swelling of her left breast and it is quite painful.  No fevers at home.  She has had intermittent central chest pain as well over the past few days and reports recent long distance travel.  Denies shortness of breath or leg pain/swelling.  Chest pain is worse when taking deep breaths.  Assessment & Plan:   Principal Problem:   Cellulitis of breast Active Problems:   Asthma, chronic   Hypothyroidism   Anxiety   Chest pain   History of breast cancer   Thrombocytopenia (HCC)  Left breast cellulitis: Patient presented with worsening Left breast redness, pain and swelling for one day. No fever, significant leukocytosis, or signs of sepsis PTA.   CT chest without evidence of necrotizing soft tissue infection.   Continue cefazolin and pain management.   Patient continued to spike fever , Obtain US left breast to rule out  Abscess. Trend WBC. Also if not improving then inflammatory breast cancer should be considered.   Patient is requesting to speak to her oncologist Dr. Chryl Heck in the morning, Oncology consulted.   Atypical Chest pain: Possibly related to breast pathology but pain is worse when taking deep breaths and she is reporting recent long distance travel.  No signs or symptoms of DVT.  PE less likely given no tachycardia or hypoxia.  Troponin, D dimer and EKG unremarkable.   History of breast cancer (invasive ductal carcinoma) status post bilateral lumpectomies 01/2020 and completed adjuvant radiation 04/2020 Currently on tamoxifen.  As mentioned above, if no clinical improvement with antibiotics within the next 42 to 72 hours, then inflammatory breast cancer should be considered.  Patient is requesting oncology consultation in the morning.   Mild thrombocytopenia No signs of bleeding.  Continue to monitor CBC.   Asthma: Stable, no signs of acute exacerbation.  Hypothyroidism: Continue Thyroid medication.  Anxiety, bipolar disorder Pharmacy med rec pending.  DVT prophylaxis:  Heparin  Code Status: Full code Family Communication: Husband at bed side. Disposition Plan:   Status is: Inpatient Remains inpatient appropriate because: Admitted for left breast cellulitis requiring IV antibiotics.   Consultants:  None   Procedures: CT Chest Antimicrobials:  Anti-infectives (From admission, onward)    Start     Dose/Rate Route Frequency Ordered Stop   04/09/22 2100  ceFAZolin (ANCEF) IVPB 1 g/50 mL premix        1 g 100 mL/hr over 30 Minutes Intravenous Every  8 hours 04/09/22 1933 04/16/22 2159   04/09/22 1315  ceFAZolin (ANCEF) IVPB 2g/100 mL premix        2 g 200 mL/hr over 30 Minutes Intravenous  Once 04/09/22 1301 04/09/22 1500       Subjective: Patient was seen and examined at bedside.  Husband at bedside.  Overnight events noted.   Patient continued to spike fevers.  Patient reports  having severe pain,  pain medications adjusted.  Objective: Vitals:   04/10/22 0544 04/10/22 0959 04/10/22 1315 04/10/22 1335  BP: (!) 92/56 108/65  102/61  Pulse: 85 (!) 103  100  Resp: 18 18  18   Temp: 99.1 F (37.3 C) (!) 102.8 F (39.3 C) 99.8 F (37.7 C) 99.7 F (37.6 C)  TempSrc:   Axillary   SpO2: 98% 99%  100%  Weight:      Height:        Intake/Output Summary (Last 24 hours) at 04/10/2022 1512 Last data filed at 04/10/2022 1339 Gross per 24 hour  Intake 100 ml  Output --  Net 100 ml   Filed Weights   04/09/22 1243  Weight: 70.7 kg    Examination:  General exam: Appears calm and comfortable,  Not in any distress  Respiratory system: Clear to auscultation. Respiratory effort normal.RR 13 Cardiovascular system: S1 & S2 heard, RRR. No JVD, murmurs, rubs, gallops or clicks. No pedal edema. Gastrointestinal system: Abdomen is soft, non tender, non distended BS+ Central nervous system: Alert and oriented x 3. No focal neurological deficits. Extremities: No edema, no cyanosis, no clubbing. Skin: Left breast erythematous swollen and warm to touch Psychiatry: Judgement and insight appear normal. Mood & affect appropriate.     Data Reviewed: I have personally reviewed following labs and imaging studies  CBC: Recent Labs  Lab 04/09/22 1330 04/10/22 0339  WBC 10.6* 11.8*  NEUTROABS 9.5*  --   HGB 13.8 12.8  HCT 42.4 39.2  MCV 94.0 93.1  PLT 112* 93*   Basic Metabolic Panel: Recent Labs  Lab 04/09/22 1330  NA 138  K 3.8  CL 105  CO2 27  GLUCOSE 120*  BUN 16  CREATININE 0.97  CALCIUM 8.7*   GFR: Estimated Creatinine Clearance: 59.9 mL/min (by C-G formula based on SCr of 0.97 mg/dL). Liver Function Tests: Recent Labs  Lab 04/09/22 1330  AST 17  ALT 15  ALKPHOS 44  BILITOT 0.8  PROT 6.9  ALBUMIN 3.6   No results for input(s): "LIPASE", "AMYLASE" in the last 168 hours. No results for input(s): "AMMONIA" in the last 168 hours. Coagulation  Profile: No results for input(s): "INR", "PROTIME" in the last 168 hours. Cardiac Enzymes: No results for input(s): "CKTOTAL", "CKMB", "CKMBINDEX", "TROPONINI" in the last 168 hours. BNP (last 3 results) No results for input(s): "PROBNP" in the last 8760 hours. HbA1C: No results for input(s): "HGBA1C" in the last 72 hours. CBG: No results for input(s): "GLUCAP" in the last 168 hours. Lipid Profile: No results for input(s): "CHOL", "HDL", "LDLCALC", "TRIG", "CHOLHDL", "LDLDIRECT" in the last 72 hours. Thyroid Function Tests: No results for input(s): "TSH", "T4TOTAL", "FREET4", "T3FREE", "THYROIDAB" in the last 72 hours. Anemia Panel: No results for input(s): "VITAMINB12", "FOLATE", "FERRITIN", "TIBC", "IRON", "RETICCTPCT" in the last 72 hours. Sepsis Labs: Recent Labs  Lab 04/09/22 1330  LATICACIDVEN 1.4    No results found for this or any previous visit (from the past 240 hour(s)).   Radiology Studies: CT Chest W Contrast  Result Date: 04/09/2022 CLINICAL DATA:  Soft tissue infection.  History of breast cancer. EXAM: CT CHEST WITH CONTRAST TECHNIQUE: Multidetector CT imaging of the chest was performed during intravenous contrast administration. RADIATION DOSE REDUCTION: This exam was performed according to the departmental dose-optimization program which includes automated exposure control, adjustment of the mA and/or kV according to patient size and/or use of iterative reconstruction technique. CONTRAST:  74mL OMNIPAQUE IOHEXOL 300 MG/ML  SOLN COMPARISON:  April 21, 2020. FINDINGS: Cardiovascular: No significant vascular findings. Normal heart size. No pericardial effusion. Mediastinum/Nodes: No enlarged mediastinal, hilar, or axillary lymph nodes. Thyroid gland, trachea, and esophagus demonstrate no significant findings. Lungs/Pleura: No pneumothorax or pleural effusion is noted. Stable 5 mm nodule is noted in right middle lobe which can be considered benign at this point with no further  follow-up required. Mild biapical scarring is noted. Minimal subsegmental atelectasis or scarring is seen involving the lingular portion of the left upper lobe. Upper Abdomen: No acute abnormality. Musculoskeletal: No chest wall abnormality. No acute or significant osseous findings. IMPRESSION: Minimal subsegmental atelectasis or scarring is seen involving the lingular portion of the left upper lobe. Stable 5 mm right middle lobe nodule is noted which can be considered benign at this point with no further follow-up required. Electronically Signed   By: Marijo Conception M.D.   On: 04/09/2022 15:12    Scheduled Meds:  heparin  5,000 Units Subcutaneous Q8H   Continuous Infusions:   ceFAZolin (ANCEF) IV Stopped (04/10/22 0603)     LOS: 0 days    Time spent: 72 mins    Duard Brady, MD [facility]   If 7PM-7AM, please contact night-coverage

## 2022-04-11 DIAGNOSIS — N61 Mastitis without abscess: Secondary | ICD-10-CM | POA: Diagnosis not present

## 2022-04-11 LAB — CBC
HCT: 37.1 % (ref 36.0–46.0)
Hemoglobin: 11.9 g/dL — ABNORMAL LOW (ref 12.0–15.0)
MCH: 30.4 pg (ref 26.0–34.0)
MCHC: 32.1 g/dL (ref 30.0–36.0)
MCV: 94.9 fL (ref 80.0–100.0)
Platelets: 72 10*3/uL — ABNORMAL LOW (ref 150–400)
RBC: 3.91 MIL/uL (ref 3.87–5.11)
RDW: 12.4 % (ref 11.5–15.5)
WBC: 8.5 10*3/uL (ref 4.0–10.5)
nRBC: 0 % (ref 0.0–0.2)

## 2022-04-11 LAB — BASIC METABOLIC PANEL
Anion gap: 5 (ref 5–15)
BUN: 20 mg/dL (ref 6–20)
CO2: 24 mmol/L (ref 22–32)
Calcium: 8.3 mg/dL — ABNORMAL LOW (ref 8.9–10.3)
Chloride: 105 mmol/L (ref 98–111)
Creatinine, Ser: 1.03 mg/dL — ABNORMAL HIGH (ref 0.44–1.00)
GFR, Estimated: >60 mL/min (ref >60)
Glucose, Bld: 129 mg/dL — ABNORMAL HIGH (ref 70–99)
Potassium: 3.2 mmol/L — ABNORMAL LOW (ref 3.5–5.1)
Sodium: 134 mmol/L — ABNORMAL LOW (ref 135–145)

## 2022-04-11 LAB — MAGNESIUM: Magnesium: 2.2 mg/dL (ref 1.7–2.4)

## 2022-04-11 LAB — PHOSPHORUS: Phosphorus: 2.3 mg/dL — ABNORMAL LOW (ref 2.5–4.6)

## 2022-04-11 MED ORDER — PROCHLORPERAZINE EDISYLATE 10 MG/2ML IJ SOLN
5.0000 mg | Freq: Once | INTRAMUSCULAR | Status: AC | PRN
  Administered 2022-04-12: 5 mg via INTRAVENOUS
  Filled 2022-04-11: qty 2

## 2022-04-11 MED ORDER — PIPERACILLIN-TAZOBACTAM 3.375 G IVPB: 3.3750 g | Freq: Three times a day (TID) | INTRAVENOUS | Status: DC

## 2022-04-11 MED ORDER — VANCOMYCIN HCL 1250 MG/250ML IV SOLN
1250.0000 mg | INTRAVENOUS | Status: DC
  Administered 2022-04-11 – 2022-04-14 (×4): 1250 mg via INTRAVENOUS
  Filled 2022-04-11 (×4): qty 250

## 2022-04-11 MED ORDER — OXYCODONE HCL 5 MG PO TABS
5.0000 mg | ORAL_TABLET | ORAL | Status: DC | PRN
  Administered 2022-04-11 – 2022-04-12 (×2): 5 mg via ORAL
  Filled 2022-04-11 (×2): qty 1

## 2022-04-11 MED ORDER — THYROID 60 MG PO TABS
90.0000 mg | ORAL_TABLET | Freq: Every day | ORAL | Status: DC
  Administered 2022-04-12 – 2022-04-14 (×3): 90 mg via ORAL
  Filled 2022-04-11 (×4): qty 1

## 2022-04-11 MED ORDER — HYDROMORPHONE HCL 1 MG/ML IJ SOLN
1.0000 mg | INTRAMUSCULAR | Status: DC | PRN
  Administered 2022-04-11 – 2022-04-12 (×3): 1 mg via INTRAVENOUS
  Filled 2022-04-11 (×4): qty 1

## 2022-04-11 MED ORDER — VITAMIN B-12 1000 MCG PO TABS
1000.0000 ug | ORAL_TABLET | Freq: Every day | ORAL | Status: DC
  Administered 2022-04-11 – 2022-04-14 (×4): 1000 ug via ORAL
  Filled 2022-04-11 (×4): qty 1

## 2022-04-11 MED ORDER — SODIUM CHLORIDE 0.9 % IV SOLN
2.0000 g | Freq: Two times a day (BID) | INTRAVENOUS | Status: DC
  Administered 2022-04-11 – 2022-04-14 (×7): 2 g via INTRAVENOUS
  Filled 2022-04-11 (×7): qty 12.5

## 2022-04-11 MED ORDER — POTASSIUM CHLORIDE 20 MEQ PO PACK
40.0000 meq | PACK | Freq: Once | ORAL | Status: AC
  Administered 2022-04-11: 40 meq via ORAL
  Filled 2022-04-11: qty 2

## 2022-04-11 MED ORDER — METRONIDAZOLE 500 MG/100ML IV SOLN
500.0000 mg | Freq: Two times a day (BID) | INTRAVENOUS | Status: DC
  Administered 2022-04-11 – 2022-04-14 (×7): 500 mg via INTRAVENOUS
  Filled 2022-04-11 (×7): qty 100

## 2022-04-11 MED ORDER — ONDANSETRON HCL 4 MG/2ML IJ SOLN
4.0000 mg | Freq: Four times a day (QID) | INTRAMUSCULAR | Status: DC | PRN
  Administered 2022-04-11 – 2022-04-13 (×5): 4 mg via INTRAVENOUS
  Filled 2022-04-11 (×5): qty 2

## 2022-04-11 MED ORDER — IPRATROPIUM-ALBUTEROL 0.5-2.5 (3) MG/3ML IN SOLN
3.0000 mL | Freq: Four times a day (QID) | RESPIRATORY_TRACT | Status: DC | PRN
  Administered 2022-04-11: 3 mL via RESPIRATORY_TRACT
  Filled 2022-04-11: qty 3

## 2022-04-11 NOTE — Plan of Care (Signed)
  Problem: Clinical Measurements: Goal: Ability to maintain clinical measurements within normal limits will improve Outcome: Progressing Goal: Will remain free from infection Outcome: Progressing   

## 2022-04-11 NOTE — Consult Note (Signed)
Consult Note  Cheyenne Garner January 14, 1966  [id number].    Requesting MD: Duard Brady, MD Chief Complaint/Reason for Consult: left breast cellulitis   HPI:  Patient is a 57 year old female who presented to the ED Sunday 3/24 with left breast pain and swelling with erythema. Hx of invasive ductal carcinoma s/p bilateral lumpectomy in Jan 2021 with re-excision of left lumpectomy in Feb 2021. She received radiation therapy from 04/05/2020 through 04/30/2020. Currently on tamoxifen and followed by Dr. Chryl Heck. Since radiation she has had an area on her left nipple that has never healed fully, appears like cracked dry skin. She has had some clear drainage from the nipple intermittently but nothing purulent. Reports increased itching to left breast. Sunday noticed increased pain to left breast during church and when she looked at her breast it was significantly red and swollen. She reached out to [place] who told her to get evaluated at an urgent care or the ED. Pain worsening since onset and she feels like erythema is worse and spreading under her arm. No fevers at home but febrile to 102.8 in the hospital. Pain exacerbated by inspiration and talking. Her husband is at the bedside with her.    ROS: Negative other than HPI  Family History  Problem Relation Age of Onset   Arthritis Mother    Dementia Mother        alzheimers   Cancer Father        bladder   Hypertension Father    Heart disease Father        MI, s/p triple bypass at age 75   Neuropathy Father        toxic peripheral    Other Father        peripheral neuropathy   Cholelithiasis Sister    Aortic aneurysm Sister 34   Aneurysm Sister        descending aortic    Mental retardation Sister        ADD, schizoaffective d/o   Obesity Sister    Depression Brother    Arthritis Brother        back disease   Prostate cancer Paternal Uncle        spread to bladder, d. 60s/70s   Dementia Maternal Grandmother    Osteoarthritis  Maternal Grandmother    Macular degeneration Maternal Grandfather    Osteoarthritis Maternal Grandfather    Congestive Heart Failure Paternal Grandmother    Cancer Paternal Grandfather        young, lung cancer?, heavy smoker   Ovarian cancer Cousin 40       maternal second cousin (MGF's great-niece)   Breast cancer Cousin 49       paternal first cousin   Cancer Niece 50       soft tissue sarcoma    Past Medical History:  Diagnosis Date   Allergies    Anxiety    Asthma    cough variant asthma developed in last couple of years   Bipolar 1 disorder ([place])    Breast cancer ([place]) 02/12/2020   left lumpectomy ID[facility] w/radiation no chemo   Cervical cancer screening 09/12/2016   Colon polyp 09/12/2016   Family history of bladder cancer    Family history of breast cancer    Family history of ovarian cancer    Family history of prostate cancer    Hx of colonic polyp 09/12/2016   Colonoscopy 2017 with 1 small polyp, done by Dr [name],  repeat colonoscopy in 5 years, 2022   Hypothyroidism    Personal history of radiation therapy    Preventative health care 07/23/2015   Thyroid disease     Past Surgical History:  Procedure Laterality Date   APPENDECTOMY     BREAST LUMPECTOMY WITH RADIOACTIVE SEED LOCALIZATION Bilateral 02/12/2020   Procedure: BILATERAL BREAST LUMPECTOMY WITH RADIOACTIVE SEED LOCALIZATION;  Surgeon: Jovita Kussmaul, MD;  Location: [facility];  Service: General;  Laterality: Bilateral;   RE-EXCISION OF BREAST LUMPECTOMY Left 03/04/2020   Procedure: LEFT BREAST MARGIN REEXCISION;  Surgeon: Jovita Kussmaul, MD;  Location: [facility];  Service: General;  Laterality: Left;   SENTINEL NODE BIOPSY Left 03/04/2020   Procedure: SENTINEL NODE BIOPSY;  Surgeon: Jovita Kussmaul, MD;  Location: [facility];  Service: General;  Laterality: Left;   SKIN BIOPSY     back moles removed (precancer?)    Social History:  reports that she has never  smoked. She has never used smokeless tobacco. She reports current alcohol use. She reports that she does not use drugs.  Allergies:  Allergies  Allergen Reactions   Gentamycin [Gentamicin] Other (See Comments)    Reacted to eye ointment, swelling red painful eyes    Medications Prior to Admission  Medication Sig Dispense Refill   albuterol (VENTOLIN HFA) 108 (90 Base) MCG/ACT inhaler Inhale 2 puffs into the lungs every 6 (six) hours as needed for wheezing or shortness of breath. 6.7 g 2   ASHWAGANDHA PO Take 2 tablets by mouth as needed (anxiety).     CALCIUM PO Take 2 tablets by mouth in the morning, at noon, and at bedtime. Bone Up     Carboxymethylcellulose Sodium (EYE DROPS OP) Place 1 drop into both eyes in the morning and at bedtime. Systemic Balance     Cyanocobalamin (VITAMIN B-12 PO) Take 1 capsule by mouth in the morning and at bedtime.     lithium 300 MG tablet Take 300 mg by mouth 2 (two) times daily.     Probiotic Product (PROBIOTIC-10 PO) Take 1 capsule by mouth at bedtime.     tamoxifen (NOLVADEX) 10 MG tablet Take 1 tablet (10 mg total) by mouth daily. 90 tablet 1   thyroid (NP THYROID) 90 MG tablet Take 1 tablet (90 mg total) by mouth daily. 30 tablet 5   VITAMIN D PO Take 5,000 Units by mouth daily.     Zinc 50 MG TABS Take 50 mg by mouth daily.      Blood pressure 96/61, pulse 84, temperature 98.4 F (36.9 C), temperature source Oral, resp. rate 18, height 5\' 6"  (1.676 m), weight 70.7 kg, SpO2 95 %. Physical Exam:  General: pleasant, WD, WN female who is laying in bed and appears in pain HEENT: head is normocephalic, atraumatic.  Sclera are noninjected.  EOMI.  Ears and nose without any masses or lesions.  Mouth is pink and moist Heart: regular, rate, and rhythm.   Lungs: Respiratory effort nonlabored Chest: left breast with significant erythema and induration, dry skin at left nipple in the 6 to 9 o'clock position but no drainage noted and no nipple discharge, no  clearly fluctuant cavity on exam Abd: soft, NT, ND MS: all 4 extremities are symmetrical with no cyanosis, clubbing, or edema. Skin: warm and dry with no masses, lesions, or rashes Neuro: Cranial nerves 2-12 grossly intact, sensation is normal throughout Psych: A&Ox3 with an appropriate affect.   Results for orders placed  or performed during the hospital encounter of 04/09/22 (from the past 48 hour(s))  Comprehensive metabolic panel     Status: Abnormal   Collection Time: 04/09/22  1:30 PM  Result Value Ref Range   Sodium 138 135 - 145 mmol/L   Potassium 3.8 3.5 - 5.1 mmol/L   Chloride 105 98 - 111 mmol/L   CO2 27 22 - 32 mmol/L   Glucose, Bld 120 (H) 70 - 99 mg/dL    Comment: Glucose reference range applies only to samples taken after fasting for at least 8 hours.   BUN 16 6 - 20 mg/dL   Creatinine, Ser 0.97 0.44 - 1.00 mg/dL   Calcium 8.7 (L) 8.9 - 10.3 mg/dL   Total Protein 6.9 6.5 - 8.1 g/dL   Albumin 3.6 3.5 - 5.0 g/dL   AST 17 15 - 41 U/L   ALT 15 0 - 44 U/L   Alkaline Phosphatase 44 38 - 126 U/L   Total Bilirubin 0.8 0.3 - 1.2 mg/dL   GFR, Estimated >60 >60 mL/min    Comment: (NOTE) Calculated using the CKD-EPI Creatinine Equation (2021)    Anion gap 6 5 - 15    Comment: Performed at [facility], [address] [place], [place] [address]  CBC with Differential     Status: Abnormal   Collection Time: 04/09/22  1:30 PM  Result Value Ref Range   WBC 10.6 (H) 4.0 - 10.5 K/uL   RBC 4.51 3.87 - 5.11 MIL/uL   Hemoglobin 13.8 12.0 - 15.0 g/dL   HCT 42.4 36.0 - 46.0 %   MCV 94.0 80.0 - 100.0 fL   MCH 30.6 26.0 - 34.0 pg   MCHC 32.5 30.0 - 36.0 g/dL   RDW 12.0 11.5 - 15.5 %   Platelets 112 (L) 150 - 400 K/uL   nRBC 0.0 0.0 - 0.2 %   Neutrophils Relative % 90 %   Neutro Abs 9.5 (H) 1.7 - 7.7 K/uL   Lymphocytes Relative 6 %   Lymphs Abs 0.6 (L) 0.7 - 4.0 K/uL   Monocytes Relative 4 %   Monocytes Absolute 0.4 0.1 - 1.0 K/uL   Eosinophils Relative 0 %    Eosinophils Absolute 0.0 0.0 - 0.5 K/uL   Basophils Relative 0 %   Basophils Absolute 0.0 0.0 - 0.1 K/uL   Immature Granulocytes 0 %   Abs Immature Granulocytes 0.03 0.00 - 0.07 K/uL    Comment: Performed at [facility], [address] [place], [place] [address]  Lactic acid, plasma     Status: None   Collection Time: 04/09/22  1:30 PM  Result Value Ref Range   Lactic Acid, Venous 1.4 0.5 - 1.9 mmol/L    Comment: Performed at [facility], [address] [place], [place] [address]  HIV Antibody (routine testing w rflx)     Status: None   Collection Time: 04/09/22  8:08 PM  Result Value Ref Range   HIV Screen 4th Generation wRfx Non Reactive Non Reactive    Comment: Performed at [facility], [place] [address], [place], [place] [address]  Troponin I (High Sensitivity)     Status: None   Collection Time: 04/09/22  8:08 PM  Result Value Ref Range   Troponin I (High Sensitivity) <2 <18 ng/L    Comment: (NOTE) Elevated high sensitivity troponin I (hsTnI) values and significant  changes across serial measurements may suggest ACS but many  other  chronic and acute conditions are known to elevate hsTnI results.  Refer to the "Links" section for chest pain algorithms and additional  guidance. Performed at [facility], [place] [address], [place], [place] [address]   D-dimer, quantitative     Status: None   Collection Time: 04/09/22  8:08 PM  Result Value Ref Range   D-Dimer, Quant <0.27 0.00 - 0.50 ug/mL-FEU    Comment: (NOTE) At the manufacturer cut-off value of 0.5 g/mL FEU, this assay has a negative predictive value of 95-100%.This assay is intended for use in conjunction with a clinical pretest probability (PTP) assessment model to exclude pulmonary embolism (PE) and deep venous thrombosis (DVT) in outpatients suspected of PE or DVT. Results should be correlated with clinical presentation. Performed at [facility], [place] [address] [place], [place] [address]   CBC     Status: Abnormal   Collection Time: 04/10/22  3:39 AM  Result Value Ref Range   WBC 11.8 (H) 4.0 - 10.5 K/uL   RBC 4.21 3.87 - 5.11 MIL/uL   Hemoglobin 12.8 12.0 - 15.0 g/dL   HCT 39.2 36.0 - 46.0 %   MCV 93.1 80.0 - 100.0 fL   MCH 30.4 26.0 - 34.0 pg   MCHC 32.7 30.0 - 36.0 g/dL   RDW 12.4 11.5 - 15.5 %   Platelets 93 (L) 150 - 400 K/uL    Comment: SPECIMEN CHECKED FOR CLOTS Immature Platelet Fraction may be clinically indicated, consider ordering this additional test [id number] REPEATED TO VERIFY    nRBC 0.0 0.0 - 0.2 %    Comment: Performed at [facility], [place] [address] [place], [place] [address]  CBC     Status: Abnormal   Collection Time: 04/11/22  4:01 AM  Result Value Ref Range   WBC 8.5 4.0 - 10.5 K/uL   RBC 3.91 3.87 - 5.11 MIL/uL   Hemoglobin 11.9 (L) 12.0 - 15.0 g/dL   HCT 37.1 36.0 - 46.0 %   MCV 94.9 80.0 - 100.0 fL   MCH 30.4 26.0 - 34.0 pg   MCHC 32.1 30.0 - 36.0 g/dL   RDW 12.4 11.5 - 15.5 %   Platelets 72 (L) 150 - 400 K/uL    Comment: Immature Platelet Fraction may be clinically indicated, consider ordering this additional test [id number] CONSISTENT WITH PREVIOUS RESULT REPEATED TO VERIFY    nRBC 0.0 0.0 - 0.2 %    Comment: Performed at [facility], [place] [address], [place], [place] [address]  Magnesium     Status: None   Collection Time: 04/11/22  4:01 AM  Result Value Ref Range   Magnesium 2.2 1.7 - 2.4 mg/dL    Comment: Performed at [facility], [place] [address], [place], [place] [id number]  Basic metabolic panel     Status: Abnormal   Collection Time: 04/11/22  4:01 AM  Result Value Ref Range   Sodium 134 (L) 135 - 145 mmol/L   Potassium 3.2 (L) 3.5 - 5.1 mmol/L   Chloride 105 98 - 111 mmol/L   CO2 24 22 - 32 mmol/L   Glucose, Bld 129 (H) 70 - 99 mg/dL    Comment: Glucose reference range applies only to samples taken after fasting for  at least 8 hours.   BUN 20 6 - 20 mg/dL   Creatinine, Ser 1.03 (H) 0.44 - 1.00 mg/dL   Calcium 8.3 (L) 8.9 - 10.3 mg/dL  GFR, Estimated >60 >60 mL/min    Comment: (NOTE) Calculated using the CKD-EPI Creatinine Equation (2021)    Anion gap 5 5 - 15    Comment: Performed at [facility], [place] [address] [place], [place] [address]  Phosphorus     Status: Abnormal   Collection Time: 04/11/22  4:01 AM  Result Value Ref Range   Phosphorus 2.3 (L) 2.5 - 4.6 mg/dL    Comment: Performed at [facility], [place] [address] [place], [place] [address]   CT Chest W Contrast  Result Date: 04/09/2022 CLINICAL DATA:  Soft tissue infection.  History of breast cancer. EXAM: CT CHEST WITH CONTRAST TECHNIQUE: Multidetector CT imaging of the chest was performed during intravenous contrast administration. RADIATION DOSE REDUCTION: This exam was performed according to the departmental dose-optimization program which includes automated exposure control, adjustment of the mA and/or kV according to patient size and/or use of iterative reconstruction technique. CONTRAST:  3mL OMNIPAQUE IOHEXOL 300 MG/ML  SOLN COMPARISON:  April 21, 2020. FINDINGS: Cardiovascular: No significant vascular findings. Normal heart size. No pericardial effusion. Mediastinum/Nodes: No enlarged mediastinal, hilar, or axillary lymph nodes. Thyroid gland, trachea, and esophagus demonstrate no significant findings. Lungs/Pleura: No pneumothorax or pleural effusion is noted. Stable 5 mm nodule is noted in right middle lobe which can be considered benign at this point with no further follow-up required. Mild biapical scarring is noted. Minimal subsegmental atelectasis or scarring is seen involving the lingular portion of the left upper lobe. Upper Abdomen: No acute abnormality. Musculoskeletal: No chest wall abnormality. No acute or significant osseous findings. IMPRESSION: Minimal subsegmental atelectasis or scarring  is seen involving the lingular portion of the left upper lobe. Stable 5 mm right middle lobe nodule is noted which can be considered benign at this point with no further follow-up required. Electronically Signed   By: Marijo Conception M.D.   On: 04/09/2022 15:12      Assessment/Plan Hx of invasive ductal carcinoma s/p bilateral lumpectomy with re-excision of left breast and s/p radiation  L breast cellulitis  - currently on tamoxifen, followed by Dr. Chryl Heck  - last mammogram 10/2021 without evidence of malignancy, last MRI 04/2021 without evidence of malignancy - [place] pending  - only mild leukocytosis on presentation, although febrile to 102.8 yesterday AM, fever curve improving this AM and WBC normalized - agree with abx - if US shows abscess then would recommend aspiration, typically this is done at the breast center but could ask IR if needed inpatient - if [place] does not show abscess then treatment would be just abx and possible punch bx to rule out inflammatory breast cancer but will discuss further with MD  FEN: HH diet  VTE: SQH ID: [id number]  I reviewed ED provider notes, hospitalist notes, last 24 h vitals and pain scores, last 48 h intake and output, last 24 h labs and trends, and last 24 h imaging results.   Norm Parcel, [facility] 04/11/2022, 10:17 AM Please see Amion for pager number during day hours 7:00am-4:30pm

## 2022-04-11 NOTE — Progress Notes (Signed)
Pharmacy Antibiotic Note  Cheyenne Garner is a 58 y.o. female admitted on 04/09/2022 with L breast cellulitis.  Pharmacy has been consulted for vancomycin dosing.  Plan: Vancomycin 1250mg  IV q24h for estimated AUC 479 using SCr 1.03, Vd 0.72 Check vancomycin levels at steady state, goal AUC 400-550 Follow up renal function & cultures as available  Height: 5\' 6"  (167.6 cm) Weight: 70.7 kg (155 lb 13.8 oz) IBW/kg (Calculated) : 59.3  Temp (24hrs), Avg:99.7 F (37.6 C), Min:98.4 F (36.9 C), Max:102.8 F (39.3 C)  Recent Labs  Lab 04/09/22 1330 04/10/22 0339 04/11/22 0401  WBC 10.6* 11.8* 8.5  CREATININE 0.97  --  1.03*  LATICACIDVEN 1.4  --   --     Estimated Creatinine Clearance: 56.4 mL/min (A) (by C-G formula based on SCr of 1.03 mg/dL (H)).    Allergies  Allergen Reactions   Gentamycin [Gentamicin] Other (See Comments)    Reacted to eye ointment, swelling red painful eyes    Antimicrobials this admission:  3/24 Cefazolin >> 3/26 3/26 Vanc >>  Dose adjustments this admission:   Microbiology results:  None  Thank you for allowing pharmacy to be a part of this patient's care.  Peggyann Juba, PharmD, BCPS Pharmacy: [phone] 04/11/2022 9:58 AM

## 2022-04-11 NOTE — TOC Progression Note (Signed)
Transition of Care [facility]) - Progression Note    Patient Details  Name: Cheyenne Garner MRN: [id number] Date of Birth: [birth year]  [facility]) CM/SW Contact  Purcell Mouton, RN Phone Number: 04/11/2022, 11:35 AM  Clinical Narrative:    Spoke with pt concerning discharge plans. Pt was not sure if she was going home with IV ABX. If she does she will need HH. TOC will continue to follow for [facility].    Expected Discharge Plan: [place] Barriers to Discharge: No Barriers Identified  Expected Discharge Plan and Services       Living arrangements for the past 2 months: Single Family Home                                       Social Determinants of Health (SDOH) Interventions SDOH Screenings   Food Insecurity: No Food Insecurity (04/09/2022)  Housing: Low Risk  (04/09/2022)  Transportation Needs: No Transportation Needs (04/09/2022)  Utilities: Not At Risk (04/09/2022)  Depression (PHQ2-9): Low Risk  (03/28/2022)  Tobacco Use: Low Risk  (04/09/2022)    Readmission Risk Interventions     No data to display

## 2022-04-11 NOTE — Progress Notes (Signed)
PROGRESS NOTE    Cheyenne Garner  [id number] DOB: 01-Mar-1965 DOA: 04/09/2022  PCP: Mosie Lukes, MD    Brief Narrative: This 57 y.o. female with PMH  significant of breast cancer (invasive ductal carcinoma) status post bilateral lumpectomies in 01/2020 and completed adjuvant radiation in 04/2020, currently on tamoxifen.  Also has history of asthma, hypothyroidism, anxiety, bipolar disorder. She presented to the ED with redness, pain, and swelling of her left breast concerning for cellulitis.  CT chest with contrast showing minimal subsegmental atelectasis or scarring involving the lingular portion of the left upper lobe and stable 5 mm right middle lobe nodule. Patient received Norco, Toradol, morphine, cefazolin, and 1 L normal saline bolus in the ED.    Patient states she had breast cancer and received radiation in 2021.  States since then there is an area next to her left nipple which is slightly cracked has not completely healed.  For the past few weeks she has had noticed clear drainage from this area and it has been itching.  Today she noticed increasing redness and swelling of her left breast and it is quite painful.  No fevers at home.  She has had intermittent central chest pain as well over the past few days and reports recent long distance travel.  Denies shortness of breath or leg pain/swelling.  Chest pain is worse when taking deep breaths.  Assessment & Plan:   Principal Problem:   Cellulitis of breast Active Problems:   Asthma, chronic   Hypothyroidism   Anxiety   Chest pain   History of breast cancer   Thrombocytopenia (HCC)  Left breast cellulitis: Patient presented with worsening Left breast redness, pain and swelling for one day. No fever, significant leukocytosis, or signs of sepsis PTA.   CT chest without evidence of necrotizing soft tissue infection.   Continue cefazolin and pain management.   Patient continued to spike fever , Obtain US left breast to rule out  Abscess. Trend WBC. Also if not improving then inflammatory breast cancer should be considered.   General surgery consulted.  Awaiting ultrasound.  If it shows abscess would be drained. Antibiotics changed to vancomycin and Flagyl.   Atypical Chest pain: Possibly related to breast pathology but pain is worse when taking deep breaths and she is reporting recent long distance travel.  No signs or symptoms of DVT.  PE less likely given no tachycardia or hypoxia.  Troponin, D dimer and EKG unremarkable. Continue oxycodone and Dilaudid for pain.   History of breast cancer (invasive ductal carcinoma) status post bilateral lumpectomies 01/2020 and completed adjuvant radiation 04/2020 Currently on tamoxifen.  As mentioned above, if no clinical improvement with antibiotics within the next 42 to 72 hours, then inflammatory breast cancer should be considered.  Patient is requesting oncology consultation in the morning.  Dr. Chryl Heck notified.   Mild thrombocytopenia No signs of bleeding.  Continue to monitor CBC.   Asthma: Stable, no signs of acute exacerbation. Resume home inhalers.  Hypothyroidism: Continue Thyroid medication.  Anxiety, bipolar disorder Pharmacy med rec pending.  DVT prophylaxis:  Heparin  Code Status: Full code Family Communication: Husband at bed side. Disposition Plan:   Status is: Inpatient Remains inpatient appropriate because: Admitted for left breast cellulitis requiring IV antibiotics.   Consultants:  General surgery Hematology  Procedures: CT Chest  Antimicrobials:  Anti-infectives (From admission, onward)    Start     Dose/Rate Route Frequency Ordered Stop   04/11/22 1400  piperacillin-tazobactam (ZOSYN) IVPB  3.375 g  Status:  Discontinued        3.375 g 12.5 mL/hr over 240 Minutes Intravenous Every 8 hours 04/11/22 1051 04/11/22 1126   04/11/22 1330  metroNIDAZOLE (FLAGYL) IVPB 500 mg        500 mg 100 mL/hr over 60 Minutes Intravenous Every 12 hours  04/11/22 1126     04/11/22 1230  ceFEPIme (MAXIPIME) 2 g in sodium chloride 0.9 % 100 mL IVPB        2 g 200 mL/hr over 30 Minutes Intravenous Every 12 hours 04/11/22 1126     04/11/22 1100  vancomycin (VANCOREADY) IVPB 1250 mg/250 mL        1,250 mg 166.7 mL/hr over 90 Minutes Intravenous Every 24 hours 04/11/22 0951     04/09/22 2100  ceFAZolin (ANCEF) IVPB 1 g/50 mL premix  Status:  Discontinued        1 g 100 mL/hr over 30 Minutes Intravenous Every 8 hours 04/09/22 1933 04/11/22 0953   04/09/22 1315  ceFAZolin (ANCEF) IVPB 2g/100 mL premix        2 g 200 mL/hr over 30 Minutes Intravenous  Once 04/09/22 1301 04/09/22 1500       Subjective: Patient was seen and examined at bedside.  Overnight events noted. Spiked fever yesterday, reports worsening pain and redness , now the redness has spread and involved whole breast.   Objective: Vitals:   04/11/22 0504 04/11/22 0600 04/11/22 1203 04/11/22 1304  BP: 96/61  114/74   Pulse: 84  95   Resp: 17 18 16    Temp: 98.4 F (36.9 C)  98.4 F (36.9 C)   TempSrc: Oral     SpO2: 95%  97% 94%  Weight:      Height:        Intake/Output Summary (Last 24 hours) at 04/11/2022 1320 Last data filed at 04/11/2022 1240 Gross per 24 hour  Intake 450 ml  Output --  Net 450 ml   Filed Weights   04/09/22 1243  Weight: 70.7 kg    Examination:  General exam: Appears calm and comfortable,  Not in any distress  Respiratory system: Clear to auscultation. Respiratory effort normal.RR 13 Cardiovascular system: S1 & S2 heard, regular rate and rhythm, no murmur. Gastrointestinal system: Abdomen is soft, non tender, non distended BS+ Central nervous system: Alert and oriented x 3. No focal neurological deficits. Extremities: No edema, no cyanosis, no clubbing. Skin: Left breast erythematous swollen , hard and warm to touch Psychiatry: Judgement and insight appear normal. Mood & affect appropriate.     Data Reviewed: I have personally reviewed  following labs and imaging studies  CBC: Recent Labs  Lab 04/09/22 1330 04/10/22 0339 04/11/22 0401  WBC 10.6* 11.8* 8.5  NEUTROABS 9.5*  --   --   HGB 13.8 12.8 11.9*  HCT 42.4 39.2 37.1  MCV 94.0 93.1 94.9  PLT 112* 93* 72*   Basic Metabolic Panel: Recent Labs  Lab 04/09/22 1330 04/11/22 0401  NA 138 134*  K 3.8 3.2*  CL 105 105  CO2 27 24  GLUCOSE 120* 129*  BUN 16 20  CREATININE 0.97 1.03*  CALCIUM 8.7* 8.3*  MG  --  2.2  PHOS  --  2.3*   GFR: Estimated Creatinine Clearance: 56.4 mL/min (A) (by C-G formula based on SCr of 1.03 mg/dL (H)). Liver Function Tests: Recent Labs  Lab 04/09/22 1330  AST 17  ALT 15  ALKPHOS 44  BILITOT 0.8  PROT  6.9  ALBUMIN 3.6   No results for input(s): "LIPASE", "AMYLASE" in the last 168 hours. No results for input(s): "AMMONIA" in the last 168 hours. Coagulation Profile: No results for input(s): "INR", "PROTIME" in the last 168 hours. Cardiac Enzymes: No results for input(s): "CKTOTAL", "CKMB", "CKMBINDEX", "TROPONINI" in the last 168 hours. BNP (last 3 results) No results for input(s): "PROBNP" in the last 8760 hours. HbA1C: No results for input(s): "HGBA1C" in the last 72 hours. CBG: No results for input(s): "GLUCAP" in the last 168 hours. Lipid Profile: No results for input(s): "CHOL", "HDL", "LDLCALC", "TRIG", "CHOLHDL", "LDLDIRECT" in the last 72 hours. Thyroid Function Tests: No results for input(s): "TSH", "T4TOTAL", "FREET4", "T3FREE", "THYROIDAB" in the last 72 hours. Anemia Panel: No results for input(s): "VITAMINB12", "FOLATE", "FERRITIN", "TIBC", "IRON", "RETICCTPCT" in the last 72 hours. Sepsis Labs: Recent Labs  Lab 04/09/22 1330  LATICACIDVEN 1.4    No results found for this or any previous visit (from the past 240 hour(s)).   Radiology Studies: [place] LIMITED ULTRASOUND INCLUDING AXILLA LEFT BREAST   Result Date: 04/11/2022 CLINICAL DATA:  Abscess EXAM: ULTRASOUND OF THE LEFT BREAST COMPARISON:   10/21/2019. FINDINGS: Images were obtained of the left breast demonstrates diffuse skin thickening. Findings are most severe in the upper outer quadrant where there is also evidence of significant subcutaneous edema. At 1 o'clock there is complex fluid which is superficial and may represent a boil or abscess measuring 2 cm. Follow up of this finding recommended after if the patient has an abscess clinically, then antibiotic therapy recommended with a follow up examination to confirm resolution. Images of the left axilla demonstrated normal node and no suspicious adenopathy. IMPRESSION: 1. Diffuse nonspecific skin thickening and subcutaneous edema which can be seen with cellulitis, third spacing or post XRT. Correlation with clinical history is 2. 2 cm complex superficial fluid collection at 1 o'clock that could be abscess in the proper clinical setting. Therapy recommended with follow up ultrasound in 3 months to ensure resolution as a precaution. RECOMMENDATION: Clinical management and three-month ultrasound follow up. BI-RADS CATEGORY  3: Probably benign. Electronically Signed   By: Sammie Bench M.D.   On: 04/11/2022 12:32   CT Chest W Contrast  Result Date: 04/09/2022 CLINICAL DATA:  Soft tissue infection.  History of breast cancer. EXAM: CT CHEST WITH CONTRAST TECHNIQUE: Multidetector CT imaging of the chest was performed during intravenous contrast administration. RADIATION DOSE REDUCTION: This exam was performed according to the departmental dose-optimization program which includes automated exposure control, adjustment of the mA and/or kV according to patient size and/or use of iterative reconstruction technique. CONTRAST:  43mL OMNIPAQUE IOHEXOL 300 MG/ML  SOLN COMPARISON:  April 21, 2020. FINDINGS: Cardiovascular: No significant vascular findings. Normal heart size. No pericardial effusion. Mediastinum/Nodes: No enlarged mediastinal, hilar, or axillary lymph nodes. Thyroid gland, trachea, and  esophagus demonstrate no significant findings. Lungs/Pleura: No pneumothorax or pleural effusion is noted. Stable 5 mm nodule is noted in right middle lobe which can be considered benign at this point with no further follow-up required. Mild biapical scarring is noted. Minimal subsegmental atelectasis or scarring is seen involving the lingular portion of the left upper lobe. Upper Abdomen: No acute abnormality. Musculoskeletal: No chest wall abnormality. No acute or significant osseous findings. IMPRESSION: Minimal subsegmental atelectasis or scarring is seen involving the lingular portion of the left upper lobe. Stable 5 mm right middle lobe nodule is noted which can be considered benign at this point with no further follow-up required.  Electronically Signed   By: Marijo Conception M.D.   On: 04/09/2022 15:12    Scheduled Meds:  vitamin B-12  1,000 mcg Oral Daily   heparin  5,000 Units Subcutaneous Q8H   thyroid  90 mg Oral Q0600   Continuous Infusions:  ceFEPime (MAXIPIME) IV 2 g (04/11/22 1240)   metronidazole     vancomycin 1,250 mg (04/11/22 1018)     LOS: 1 day    Time spent: 65 mins    Duard Brady, MD [facility]   If 7PM-7AM, please contact night-coverage

## 2022-04-11 NOTE — Plan of Care (Signed)

## 2022-04-11 NOTE — Progress Notes (Signed)
Patient ID: AISHLING FAIRLEY, female   DOB: Apr 18, 1965, 57 y.o.   MRN: [id number] Asked by [facility] to evaluate pt for possible left breast abscess aspiration. Imaging studies were reviewed by Dr. Kathlene Cote. Per his review there is no discrete abscess present but fluid which is all spread out, no true focal collection. No indication for aspiration. Treat with antbx and f/u imaging as necessary. [facility] aware.

## 2022-04-12 DIAGNOSIS — N61 Mastitis without abscess: Secondary | ICD-10-CM | POA: Diagnosis not present

## 2022-04-12 LAB — CBC
HCT: 36.9 % (ref 36.0–46.0)
Hemoglobin: 11.6 g/dL — ABNORMAL LOW (ref 12.0–15.0)
MCH: 30.4 pg (ref 26.0–34.0)
MCHC: 31.4 g/dL (ref 30.0–36.0)
MCV: 96.9 fL (ref 80.0–100.0)
Platelets: 76 10*3/uL — ABNORMAL LOW (ref 150–400)
RBC: 3.81 MIL/uL — ABNORMAL LOW (ref 3.87–5.11)
RDW: 12.5 % (ref 11.5–15.5)
WBC: 8.1 10*3/uL (ref 4.0–10.5)
nRBC: 0 % (ref 0.0–0.2)

## 2022-04-12 LAB — BASIC METABOLIC PANEL
Anion gap: 5 (ref 5–15)
BUN: 14 mg/dL (ref 6–20)
CO2: 24 mmol/L (ref 22–32)
Calcium: 8.5 mg/dL — ABNORMAL LOW (ref 8.9–10.3)
Chloride: 107 mmol/L (ref 98–111)
Creatinine, Ser: 0.9 mg/dL (ref 0.44–1.00)
GFR, Estimated: >60 mL/min (ref >60)
Glucose, Bld: 107 mg/dL — ABNORMAL HIGH (ref 70–99)
Potassium: 4.2 mmol/L (ref 3.5–5.1)
Sodium: 136 mmol/L (ref 135–145)

## 2022-04-12 NOTE — Progress Notes (Addendum)
[facility] Progress Note     Subjective: CC:  Cc headache and poor sleep. Tells me her breast pain and redness are slightly better.  Objective: Vital signs in last 24 hours: Temp:  [98.1 F (36.7 C)-103.2 F (39.6 C)] 98.1 F (36.7 C) (03/27 0258) Pulse Rate:  [77-101] 77 (03/27 0258) Resp:  [16-20] 18 (03/27 0258) BP: (92-114)/(65-74) 92/65 (03/27 0258) SpO2:  [93 %-97 %] 95 % (03/27 0258) Last BM Date : 04/10/22  Intake/Output from previous day: 03/26 0701 - 03/27 0700 In: 600 [IV Piggyback:600] Out: -  Intake/Output this shift: Total I/O In: 118 [P.O.:118] Out: -   PE: Gen:  Alert, NAD, pleasant Card:  Regular rate and rhythm, pedal pulses 2+ BL Pulm:  Normal effort ORA  Chest: left breast with significant erythema and induration - this is not expanding and is stable, dry skin at left nipple in the 6 to 9 o'clock position but no drainage noted and no nipple discharge, no clearly fluctuant cavity on exam  Abd: Soft, non-tender, non-distended, bowel sounds present in all 4 quadrants, no HSM, incisions C/D/I Skin: warm and dry, no rashes  Psych: A&Ox3   Lab Results:  Recent Labs    04/11/22 0401 04/12/22 0417  WBC 8.5 8.1  HGB 11.9* 11.6*  HCT 37.1 36.9  PLT 72* 76*   BMET Recent Labs    04/11/22 0401 04/12/22 0417  NA 134* 136  K 3.2* 4.2  CL 105 107  CO2 24 24  GLUCOSE 129* 107*  BUN 20 14  CREATININE 1.03* 0.90  CALCIUM 8.3* 8.5*   PT/INR No results for input(s): "LABPROT", "INR" in the last 72 hours. CMP     Component Value Date/Time   NA 136 04/12/2022 0417   NA 143 04/21/2020 0843   K 4.2 04/12/2022 0417   CL 107 04/12/2022 0417   CO2 24 04/12/2022 0417   GLUCOSE 107 (H) 04/12/2022 0417   BUN 14 04/12/2022 0417   BUN 13 04/21/2020 0843   CREATININE 0.90 04/12/2022 0417   CREATININE 1.16 (H) 11/28/2021 1308   CALCIUM 8.5 (L) 04/12/2022 0417   PROT 6.9 04/09/2022 1330   ALBUMIN 3.6 04/09/2022 1330   AST 17 04/09/2022  1330   AST 16 11/28/2021 1308   ALT 15 04/09/2022 1330   ALT 15 11/28/2021 1308   ALKPHOS 44 04/09/2022 1330   BILITOT 0.8 04/09/2022 1330   BILITOT 0.6 11/28/2021 1308   GFRNONAA >60 04/12/2022 0417   GFRNONAA 55 (L) 11/28/2021 1308   GFRAA >60 06/25/2014 1948   Lipase     Component Value Date/Time   LIPASE 20 (L) 06/25/2014 1948       Studies/Results: [place] LIMITED ULTRASOUND INCLUDING AXILLA LEFT BREAST   Result Date: 04/11/2022 CLINICAL DATA:  Abscess EXAM: ULTRASOUND OF THE LEFT BREAST COMPARISON:  10/21/2019. FINDINGS: Images were obtained of the left breast demonstrates diffuse skin thickening. Findings are most severe in the upper outer quadrant where there is also evidence of significant subcutaneous edema. At 1 o'clock there is complex fluid which is superficial and may represent a boil or abscess measuring 2 cm. Follow up of this finding recommended after if the patient has an abscess clinically, then antibiotic therapy recommended with a follow up examination to confirm resolution. Images of the left axilla demonstrated normal node and no suspicious adenopathy. IMPRESSION: 1. Diffuse nonspecific skin thickening and subcutaneous edema which can be seen with cellulitis, third spacing or post XRT. Correlation with clinical history  is 2. 2 cm complex superficial fluid collection at 1 o'clock that could be abscess in the proper clinical setting. Therapy recommended with follow up ultrasound in 3 months to ensure resolution as a precaution. RECOMMENDATION: Clinical management and three-month ultrasound follow up. BI-RADS CATEGORY  3: Probably benign. Electronically Signed   By: Sammie Bench M.D.   On: 04/11/2022 12:32    Anti-infectives: Anti-infectives (From admission, onward)    Start     Dose/Rate Route Frequency Ordered Stop   04/11/22 1400  piperacillin-tazobactam (ZOSYN) IVPB 3.375 g  Status:  Discontinued        3.375 g 12.5 mL/hr over 240 Minutes Intravenous Every 8  hours 04/11/22 1051 04/11/22 1126   04/11/22 1330  metroNIDAZOLE (FLAGYL) IVPB 500 mg        500 mg 100 mL/hr over 60 Minutes Intravenous Every 12 hours 04/11/22 1126     04/11/22 1230  ceFEPIme (MAXIPIME) 2 g in sodium chloride 0.9 % 100 mL IVPB        2 g 200 mL/hr over 30 Minutes Intravenous Every 12 hours 04/11/22 1126     04/11/22 1100  vancomycin (VANCOREADY) IVPB 1250 mg/250 mL        1,250 mg 166.7 mL/hr over 90 Minutes Intravenous Every 24 hours 04/11/22 0951     04/09/22 2100  ceFAZolin (ANCEF) IVPB 1 g/50 mL premix  Status:  Discontinued        1 g 100 mL/hr over 30 Minutes Intravenous Every 8 hours 04/09/22 1933 04/11/22 0953   04/09/22 1315  ceFAZolin (ANCEF) IVPB 2g/100 mL premix        2 g 200 mL/hr over 30 Minutes Intravenous  Once 04/09/22 1301 04/09/22 1500        Assessment/Plan  Hx of invasive ductal carcinoma s/p bilateral lumpectomy with re-excision of left breast and s/p radiation  L breast cellulitis  - currently on tamoxifen, followed by Dr. Chryl Heck  - last mammogram 10/2021 without evidence of malignancy, last MRI 04/2021 without evidence of malignancy - US of the breast with no significant fluid collection - WBC 8.1 from 11 on admission, having less frequent fevers but did spike a temp to 103 yesterday evening  - agree with abx - continue abx, if there is not significant improvement in the next 24-48 hours then she may need a punch bx to assess for inflammatory breast cancer. will discuss further with MD   FEN: HH diet  VTE: SQH ID: [email or web address]    LOS: 2 days   I reviewed nursing notes, hospitalist notes, last 24 h vitals and pain scores, last 48 h intake and output, last 24 h labs and trends, and last 24 h imaging results.  This care required straight-forward level of medical decision making.   Obie Dredge, PA-C [facility] Please see Amion for pager number during day hours 7:00am-4:30pm   Tried to see a couple times was not in room,  agree with plan Probably a good idea to check blood cx when febrile

## 2022-04-12 NOTE — Progress Notes (Signed)
PROGRESS NOTE    Cheyenne Garner  [id number] DOB: 1965/07/13 DOA: 04/09/2022  PCP: Mosie Lukes, MD    Brief Narrative: This 57 y.o. female with PMH  significant of breast cancer (invasive ductal carcinoma) status post bilateral lumpectomies in 01/2020 and completed adjuvant radiation in 04/2020, currently on tamoxifen.  Also has history of asthma, hypothyroidism, anxiety, bipolar disorder. She presented to the ED with redness, pain, and swelling of her left breast concerning for cellulitis.  CT chest with contrast showing minimal subsegmental atelectasis or scarring involving the lingular portion of the left upper lobe and stable 5 mm right middle lobe nodule. Patient received Norco, Toradol, morphine, cefazolin, and 1 L normal saline bolus in the ED.    Patient states she had breast cancer and received radiation in 2021.  States since then there is an area next to her left nipple which is slightly cracked has not completely healed.  For the past few weeks she has had noticed clear drainage from this area and it has been itching.  Today she noticed increasing redness and swelling of her left breast and it is quite painful.  No fevers at home.  She has had intermittent central chest pain as well over the past few days and reports recent long distance travel.  Denies shortness of breath or leg pain/swelling.  Chest pain is worse when taking deep breaths.  Assessment & Plan:   Principal Problem:   Cellulitis of breast Active Problems:   Asthma, chronic   Hypothyroidism   Anxiety   Chest pain   History of breast cancer   Thrombocytopenia (HCC)  Left breast cellulitis: Patient presented with worsening Left breast redness, pain and swelling for one day. No fever, significant leukocytosis, or signs of sepsis PTA.   CT chest without evidence of necrotizing soft tissue infection.   Initiated on cefazolin and pain management.   Patient continued to spike fever , ultrasound left breast shows  finding consistent with cellulitis or small abscess  Also if not improving then inflammatory breast cancer should be considered.   General surgery consulted.  If no improvement in next 24 to 48 hours may need punch biopsy. Antibiotics changed to vancomycin, Cefepime and Flagyl.   Atypical Chest pain: Possibly related to breast pathology but pain is worse when taking deep breaths and she is reporting recent long distance travel.  No signs or symptoms of DVT.  PE less likely given no tachycardia or hypoxia.  Troponin, D dimer and EKG unremarkable. Continue oxycodone and Dilaudid for pain.   History of breast cancer (invasive ductal carcinoma) status post bilateral lumpectomies 01/2020 and completed adjuvant radiation 04/2020 Currently on tamoxifen.  As mentioned above, if no clinical improvement with antibiotics within the next 42 to 72 hours, then inflammatory breast cancer should be considered.  Oncology Dr. Lajuan Lines notified.  Mild thrombocytopenia No signs of bleeding.  Continue to monitor CBC.   Asthma: Stable, no signs of acute exacerbation. Resume home inhalers.  Hypothyroidism: Continue Thyroid medication.  Anxiety, bipolar disorder Not on any anxiety medications  DVT prophylaxis:  Heparin  Code Status: Full code Family Communication: Husband at bed side. Disposition Plan:   Status is: Inpatient Remains inpatient appropriate because: Admitted for left breast cellulitis requiring IV antibiotics.   Consultants:  General surgery Hematology  Procedures: CT Chest  Antimicrobials:  Anti-infectives (From admission, onward)    Start     Dose/Rate Route Frequency Ordered Stop   04/11/22 1400  piperacillin-tazobactam (ZOSYN) IVPB 3.375  g  Status:  Discontinued        3.375 g 12.5 mL/hr over 240 Minutes Intravenous Every 8 hours 04/11/22 1051 04/11/22 1126   04/11/22 1330  metroNIDAZOLE (FLAGYL) IVPB 500 mg        500 mg 100 mL/hr over 60 Minutes Intravenous Every 12 hours 04/11/22  1126     04/11/22 1230  ceFEPIme (MAXIPIME) 2 g in sodium chloride 0.9 % 100 mL IVPB        2 g 200 mL/hr over 30 Minutes Intravenous Every 12 hours 04/11/22 1126     04/11/22 1100  vancomycin (VANCOREADY) IVPB 1250 mg/250 mL        1,250 mg 166.7 mL/hr over 90 Minutes Intravenous Every 24 hours 04/11/22 0951     04/09/22 2100  ceFAZolin (ANCEF) IVPB 1 g/50 mL premix  Status:  Discontinued        1 g 100 mL/hr over 30 Minutes Intravenous Every 8 hours 04/09/22 1933 04/11/22 0953   04/09/22 1315  ceFAZolin (ANCEF) IVPB 2g/100 mL premix        2 g 200 mL/hr over 30 Minutes Intravenous  Once 04/09/22 1301 04/09/22 1500       Subjective: Patient was seen and examined at bedside.  Overnight events noted. She remained afebrile in last 24 hours. She reports pain and redness is improving.   Objective: Vitals:   04/11/22 1304 04/11/22 2020 04/11/22 2222 04/12/22 0258  BP:  103/70 111/73 92/65  Pulse:  (!) 101 93 77  Resp:  20 18 18   Temp:  (!) 103.2 F (39.6 C) 98.5 F (36.9 C) 98.1 F (36.7 C)  TempSrc:  Oral    SpO2: 94% 93% 95% 95%  Weight:      Height:        Intake/Output Summary (Last 24 hours) at 04/12/2022 1347 Last data filed at 04/12/2022 0900 Gross per 24 hour  Intake 318 ml  Output --  Net 318 ml   Filed Weights   04/09/22 1243  Weight: 70.7 kg    Examination:  General exam: Appears calm and comfortable, not in any distress, anxious. Respiratory system: CTA bilaterally . Respiratory effort normal. RR 15 Cardiovascular system: S1 & S2 heard, regular rate and rhythm, no murmur. Gastrointestinal system: Abdomen is soft, non tender, non distended BS+ Central nervous system: Alert and oriented x 3. No focal neurological deficits. Extremities: No edema, no cyanosis, no clubbing. Skin: Left breast erythematous swollen , hard and warm to touch Psychiatry: Judgement and insight appear normal. Mood & affect appropriate.     Data Reviewed: I have personally  reviewed following labs and imaging studies  CBC: Recent Labs  Lab 04/09/22 1330 04/10/22 0339 04/11/22 0401 04/12/22 0417  WBC 10.6* 11.8* 8.5 8.1  NEUTROABS 9.5*  --   --   --   HGB 13.8 12.8 11.9* 11.6*  HCT 42.4 39.2 37.1 36.9  MCV 94.0 93.1 94.9 96.9  PLT 112* 93* 72* 76*   Basic Metabolic Panel: Recent Labs  Lab 04/09/22 1330 04/11/22 0401 04/12/22 0417  NA 138 134* 136  K 3.8 3.2* 4.2  CL 105 105 107  CO2 27 24 24   GLUCOSE 120* 129* 107*  BUN 16 20 14   CREATININE 0.97 1.03* 0.90  CALCIUM 8.7* 8.3* 8.5*  MG  --  2.2  --   PHOS  --  2.3*  --    GFR: Estimated Creatinine Clearance: 64.6 mL/min (by C-G formula based on SCr of 0.9  mg/dL). Liver Function Tests: Recent Labs  Lab 04/09/22 1330  AST 17  ALT 15  ALKPHOS 44  BILITOT 0.8  PROT 6.9  ALBUMIN 3.6   No results for input(s): "LIPASE", "AMYLASE" in the last 168 hours. No results for input(s): "AMMONIA" in the last 168 hours. Coagulation Profile: No results for input(s): "INR", "PROTIME" in the last 168 hours. Cardiac Enzymes: No results for input(s): "CKTOTAL", "CKMB", "CKMBINDEX", "TROPONINI" in the last 168 hours. BNP (last 3 results) No results for input(s): "PROBNP" in the last 8760 hours. HbA1C: No results for input(s): "HGBA1C" in the last 72 hours. CBG: No results for input(s): "GLUCAP" in the last 168 hours. Lipid Profile: No results for input(s): "CHOL", "HDL", "LDLCALC", "TRIG", "CHOLHDL", "LDLDIRECT" in the last 72 hours. Thyroid Function Tests: No results for input(s): "TSH", "T4TOTAL", "FREET4", "T3FREE", "THYROIDAB" in the last 72 hours. Anemia Panel: No results for input(s): "VITAMINB12", "FOLATE", "FERRITIN", "TIBC", "IRON", "RETICCTPCT" in the last 72 hours. Sepsis Labs: Recent Labs  Lab 04/09/22 1330  LATICACIDVEN 1.4    No results found for this or any previous visit (from the past 240 hour(s)).   Radiology Studies: [place] LIMITED ULTRASOUND INCLUDING AXILLA LEFT BREAST    Result Date: 04/11/2022 CLINICAL DATA:  Abscess EXAM: ULTRASOUND OF THE LEFT BREAST COMPARISON:  10/21/2019. FINDINGS: Images were obtained of the left breast demonstrates diffuse skin thickening. Findings are most severe in the upper outer quadrant where there is also evidence of significant subcutaneous edema. At 1 o'clock there is complex fluid which is superficial and may represent a boil or abscess measuring 2 cm. Follow up of this finding recommended after if the patient has an abscess clinically, then antibiotic therapy recommended with a follow up examination to confirm resolution. Images of the left axilla demonstrated normal node and no suspicious adenopathy. IMPRESSION: 1. Diffuse nonspecific skin thickening and subcutaneous edema which can be seen with cellulitis, third spacing or post XRT. Correlation with clinical history is 2. 2 cm complex superficial fluid collection at 1 o'clock that could be abscess in the proper clinical setting. Therapy recommended with follow up ultrasound in 3 months to ensure resolution as a precaution. RECOMMENDATION: Clinical management and three-month ultrasound follow up. BI-RADS CATEGORY  3: Probably benign. Electronically Signed   By: Sammie Bench M.D.   On: 04/11/2022 12:32    Scheduled Meds:  vitamin B-12  1,000 mcg Oral Daily   heparin  5,000 Units Subcutaneous Q8H   thyroid  90 mg Oral Q0600   Continuous Infusions:  ceFEPime (MAXIPIME) IV 2 g (04/12/22 1246)   metronidazole 500 mg (04/12/22 0134)   vancomycin 1,250 mg (04/12/22 0924)     LOS: 2 days    Time spent: 35 mins    Duard Brady, MD [facility]   If 7PM-7AM, please contact night-coverage

## 2022-04-13 DIAGNOSIS — N61 Mastitis without abscess: Secondary | ICD-10-CM | POA: Diagnosis not present

## 2022-04-13 MED ORDER — BUTALBITAL-APAP-CAFFEINE 50-325-40 MG PO TABS
1.0000 | ORAL_TABLET | Freq: Once | ORAL | Status: AC
  Administered 2022-04-13: 1 via ORAL
  Filled 2022-04-13: qty 1

## 2022-04-13 MED ORDER — METOCLOPRAMIDE HCL 5 MG/ML IJ SOLN
10.0000 mg | Freq: Four times a day (QID) | INTRAMUSCULAR | Status: DC
  Administered 2022-04-13 – 2022-04-14 (×5): 10 mg via INTRAVENOUS
  Filled 2022-04-13 (×7): qty 2

## 2022-04-13 MED ORDER — METOCLOPRAMIDE HCL 5 MG/ML IJ SOLN: 20.0000 mg | Freq: Once | INTRAVENOUS | Status: DC

## 2022-04-13 MED ORDER — LITHIUM CARBONATE ER 300 MG PO TBCR
600.0000 mg | EXTENDED_RELEASE_TABLET | Freq: Every day | ORAL | Status: DC
  Administered 2022-04-13: 600 mg via ORAL
  Filled 2022-04-13: qty 2

## 2022-04-13 MED ORDER — TAMOXIFEN CITRATE 10 MG PO TABS
10.0000 mg | ORAL_TABLET | Freq: Every day | ORAL | Status: DC
  Filled 2022-04-13 (×2): qty 1

## 2022-04-13 MED ORDER — METOCLOPRAMIDE HCL 5 MG/ML IJ SOLN
10.0000 mg | Freq: Once | INTRAMUSCULAR | Status: AC
  Administered 2022-04-13: 10 mg via INTRAVENOUS
  Filled 2022-04-13: qty 2

## 2022-04-13 NOTE — Progress Notes (Addendum)
[facility] Progress Note     Subjective: CC: headache, just took fiorocet.   Slept better last night but still having headache and nausea. Reports less pain in her breast. She is able to touch/lift her breast now without excruciating pain.   Objective: Vital signs in last 24 hours: Temp:  [98.1 F (36.7 C)-98.4 F (36.9 C)] 98.4 F (36.9 C) (03/27 2034) Pulse Rate:  [84-90] 90 (03/27 2034) Resp:  [17] 17 (03/27 2034) BP: (112-119)/(72-73) 119/72 (03/27 2034) SpO2:  [95 %-97 %] 97 % (03/27 2034) Last BM Date : 04/10/22  Intake/Output from previous day: 03/27 0701 - 03/28 0700 In: 118 [P.O.:118] Out: -  Intake/Output this shift: No intake/output data recorded.  PE: Gen:  Alert, NAD, pleasant Card:  Regular rate and rhythm, pedal pulses 2+ BL Pulm:  Normal effort ORA  Chest: left breast with erythema and induration but it has improved significantly in the last 24h. Less tender, less indurated. No fluctuance or drainage.   Abd: Soft, non-tender, non-distended, bowel sounds present in all 4 quadrants, no HSM Skin: warm and dry, no rashes  Psych: A&Ox3   Lab Results:  Recent Labs    04/11/22 0401 04/12/22 0417  WBC 8.5 8.1  HGB 11.9* 11.6*  HCT 37.1 36.9  PLT 72* 76*   BMET Recent Labs    04/11/22 0401 04/12/22 0417  NA 134* 136  K 3.2* 4.2  CL 105 107  CO2 24 24  GLUCOSE 129* 107*  BUN 20 14  CREATININE 1.03* 0.90  CALCIUM 8.3* 8.5*   PT/INR No results for input(s): "LABPROT", "INR" in the last 72 hours. CMP     Component Value Date/Time   NA 136 04/12/2022 0417   NA 143 04/21/2020 0843   K 4.2 04/12/2022 0417   CL 107 04/12/2022 0417   CO2 24 04/12/2022 0417   GLUCOSE 107 (H) 04/12/2022 0417   BUN 14 04/12/2022 0417   BUN 13 04/21/2020 0843   CREATININE 0.90 04/12/2022 0417   CREATININE 1.16 (H) 11/28/2021 1308   CALCIUM 8.5 (L) 04/12/2022 0417   PROT 6.9 04/09/2022 1330   ALBUMIN 3.6 04/09/2022 1330   AST 17 04/09/2022 1330    AST 16 11/28/2021 1308   ALT 15 04/09/2022 1330   ALT 15 11/28/2021 1308   ALKPHOS 44 04/09/2022 1330   BILITOT 0.8 04/09/2022 1330   BILITOT 0.6 11/28/2021 1308   GFRNONAA >60 04/12/2022 0417   GFRNONAA 55 (L) 11/28/2021 1308   GFRAA >60 06/25/2014 1948   Lipase     Component Value Date/Time   LIPASE 20 (L) 06/25/2014 1948       Studies/Results: No results found.  Anti-infectives: Anti-infectives (From admission, onward)    Start     Dose/Rate Route Frequency Ordered Stop   04/11/22 1400  piperacillin-tazobactam (ZOSYN) IVPB 3.375 g  Status:  Discontinued        3.375 g 12.5 mL/hr over 240 Minutes Intravenous Every 8 hours 04/11/22 1051 04/11/22 1126   04/11/22 1330  metroNIDAZOLE (FLAGYL) IVPB 500 mg        500 mg 100 mL/hr over 60 Minutes Intravenous Every 12 hours 04/11/22 1126     04/11/22 1230  ceFEPIme (MAXIPIME) 2 g in sodium chloride 0.9 % 100 mL IVPB        2 g 200 mL/hr over 30 Minutes Intravenous Every 12 hours 04/11/22 1126     04/11/22 1100  vancomycin (VANCOREADY) IVPB 1250 mg/250 mL  1,250 mg 166.7 mL/hr over 90 Minutes Intravenous Every 24 hours 04/11/22 0951     04/09/22 2100  ceFAZolin (ANCEF) IVPB 1 g/50 mL premix  Status:  Discontinued        1 g 100 mL/hr over 30 Minutes Intravenous Every 8 hours 04/09/22 1933 04/11/22 0953   04/09/22 1315  ceFAZolin (ANCEF) IVPB 2g/100 mL premix        2 g 200 mL/hr over 30 Minutes Intravenous  Once 04/09/22 1301 04/09/22 1500        Assessment/Plan  Hx of invasive ductal carcinoma s/p bilateral lumpectomy with re-excision of left breast and s/p radiation  L breast cellulitis  - currently on tamoxifen, followed by Dr. Chryl Heck  - last mammogram 10/2021 without evidence of malignancy, last MRI 04/2021 without evidence of malignancy - US of the breast with no significant fluid collection - WBC 8.1 yesterday from 11 on admission, afebrile now but previously spiking temps up to 103 - agree with IV abx -  continue abx, today there is obvious improvement in her breast exam today. If this continues then I think she could be discharged in 24-48 h on PO abx. if there is not significant improvement in the next 24-48 hours then she may need a bx to assess for inflammatory breast cancer. [facility] will follow closely   FEN: HH diet  VTE: SQH ID: vanc, blood Cx 3/27 NGTD   LOS: 3 days   I reviewed nursing notes, hospitalist notes, last 24 h vitals and pain scores, last 48 h intake and output, last 24 h labs and trends, and last 24 h imaging results.  This care required straight-forward level of medical decision making.   Obie Dredge, PA-C [facility] Please see Amion for pager number during day hours 7:00am-4:30pm

## 2022-04-13 NOTE — Progress Notes (Signed)
PROGRESS NOTE    Cheyenne Garner  [id number] DOB: 11-Mar-1965 DOA: 04/09/2022  PCP: Mosie Lukes, MD    Brief Narrative: This 57 y.o. female with PMH  significant of breast cancer (invasive ductal carcinoma) status post bilateral lumpectomies in 01/2020 and completed adjuvant radiation in 04/2020, currently on tamoxifen.  Also has history of asthma, hypothyroidism, anxiety, bipolar disorder. She presented to the ED with redness, pain, and swelling of her left breast concerning for cellulitis.  CT chest with contrast showing minimal subsegmental atelectasis or scarring involving the lingular portion of the left upper lobe and stable 5 mm right middle lobe nodule. Patient received Norco, Toradol, morphine, cefazolin, and 1 L normal saline bolus in the ED.    Patient states she had breast cancer and received radiation in 2021.  States since then there is an area next to her left nipple which is slightly cracked has not completely healed.  For the past few weeks she has had noticed clear drainage from this area and it has been itching.  Today she noticed increasing redness and swelling of her left breast and it is quite painful.  No fevers at home.  She has had intermittent central chest pain as well over the past few days and reports recent long distance travel.  Denies shortness of breath or leg pain/swelling.  Chest pain is worse when taking deep breaths.  Assessment & Plan:   Principal Problem:   Cellulitis of breast Active Problems:   Asthma, chronic   Hypothyroidism   Anxiety   Chest pain   History of breast cancer   Thrombocytopenia (HCC)  Left breast cellulitis: Patient presented with worsening Left breast redness, pain and swelling for one day. No fever, significant leukocytosis, or signs of sepsis PTA.   CT chest without evidence of necrotizing soft tissue infection.   Initiated on cefazolin and pain management.   Patient continued to spike fever , ultrasound left breast shows  findings consistent with cellulitis or small abscess Also if not improving then inflammatory breast cancer should be considered.   General surgery consulted.  If no improvement in next 24 to 48 hours may need punch biopsy. Antibiotics changed to vancomycin, Cefepime and Flagyl. Cellulitis has much improved.  Continue current antibiotics.   Atypical Chest pain: Possibly related to breast pathology but pain is worse when taking deep breaths and she is reporting recent long distance travel.   No signs or symptoms of DVT.  PE less likely given no tachycardia or hypoxia.  Troponin, D dimer and EKG unremarkable. Continue oxycodone and Dilaudid for pain.   History of breast cancer (invasive ductal carcinoma) status post bilateral lumpectomies 01/2020 and completed adjuvant radiation 04/2020 Currently on tamoxifen.  As mentioned above, if no clinical improvement with antibiotics within the next 42 to 72 hours, then inflammatory breast cancer should be considered.  Oncology Dr. Lajuan Lines notified.  Mild thrombocytopenia No signs of bleeding.  Continue to monitor CBC.   Asthma: Stable, no signs of acute exacerbation. Resume home inhalers.  Hypothyroidism: Continue Thyroid medication.  Anxiety, bipolar disorder Not on any anxiety medications.  DVT prophylaxis:  Heparin  Code Status: Full code Family Communication: Husband at bed side. Disposition Plan:   Status is: Inpatient Remains inpatient appropriate because: Admitted for left breast cellulitis requiring IV antibiotics.   Consultants:  General surgery Hematology  Procedures: CT Chest  Antimicrobials:  Anti-infectives (From admission, onward)    Start     Dose/Rate Route Frequency Ordered Stop  04/11/22 1400  piperacillin-tazobactam (ZOSYN) IVPB 3.375 g  Status:  Discontinued        3.375 g 12.5 mL/hr over 240 Minutes Intravenous Every 8 hours 04/11/22 1051 04/11/22 1126   04/11/22 1330  metroNIDAZOLE (FLAGYL) IVPB 500 mg        500  mg 100 mL/hr over 60 Minutes Intravenous Every 12 hours 04/11/22 1126     04/11/22 1230  ceFEPIme (MAXIPIME) 2 g in sodium chloride 0.9 % 100 mL IVPB        2 g 200 mL/hr over 30 Minutes Intravenous Every 12 hours 04/11/22 1126     04/11/22 1100  vancomycin (VANCOREADY) IVPB 1250 mg/250 mL        1,250 mg 166.7 mL/hr over 90 Minutes Intravenous Every 24 hours 04/11/22 0951     04/09/22 2100  ceFAZolin (ANCEF) IVPB 1 g/50 mL premix  Status:  Discontinued        1 g 100 mL/hr over 30 Minutes Intravenous Every 8 hours 04/09/22 1933 04/11/22 0953   04/09/22 1315  ceFAZolin (ANCEF) IVPB 2g/100 mL premix        2 g 200 mL/hr over 30 Minutes Intravenous  Once 04/09/22 1301 04/09/22 1500       Subjective: Patient was seen and examined at bedside.  Overnight events noted. She remained afebrile in the last 48 hours.  Pain and redness has significantly improved.  Objective: Vitals:   04/11/22 2222 04/12/22 0258 04/12/22 1404 04/12/22 2034  BP: 111/73 92/65 112/73 119/72  Pulse: 93 77 84 90  Resp: 18 18 17 17   Temp: 98.5 F (36.9 C) 98.1 F (36.7 C) 98.1 F (36.7 C) 98.4 F (36.9 C)  TempSrc:      SpO2: 95% 95% 95% 97%  Weight:      Height:       No intake or output data in the 24 hours ending 04/13/22 1359  Filed Weights   04/09/22 1243  Weight: 70.7 kg    Examination:  General exam: Appears calm and comfortable, not in any distress, anxious. Respiratory system: CTA bilaterally . Respiratory effort normal. RR 15 Cardiovascular system: S1 & S2 heard, regular rate and rhythm, no murmur. Gastrointestinal system: Abdomen is soft, non tender, non distended BS+ Central nervous system: Alert and oriented x 3. No focal neurological deficits. Extremities: No edema, no cyanosis, no clubbing. Skin: Left breast erythematous less swollen , warm to touch, less tender. Psychiatry: Judgement and insight appear normal. Mood & affect appropriate.     Data Reviewed: I have personally  reviewed following labs and imaging studies  CBC: Recent Labs  Lab 04/09/22 1330 04/10/22 0339 04/11/22 0401 04/12/22 0417  WBC 10.6* 11.8* 8.5 8.1  NEUTROABS 9.5*  --   --   --   HGB 13.8 12.8 11.9* 11.6*  HCT 42.4 39.2 37.1 36.9  MCV 94.0 93.1 94.9 96.9  PLT 112* 93* 72* 76*   Basic Metabolic Panel: Recent Labs  Lab 04/09/22 1330 04/11/22 0401 04/12/22 0417  NA 138 134* 136  K 3.8 3.2* 4.2  CL 105 105 107  CO2 27 24 24   GLUCOSE 120* 129* 107*  BUN 16 20 14   CREATININE 0.97 1.03* 0.90  CALCIUM 8.7* 8.3* 8.5*  MG  --  2.2  --   PHOS  --  2.3*  --    GFR: Estimated Creatinine Clearance: 64.6 mL/min (by C-G formula based on SCr of 0.9 mg/dL). Liver Function Tests: Recent Labs  Lab 04/09/22 1330  AST 17  ALT 15  ALKPHOS 44  BILITOT 0.8  PROT 6.9  ALBUMIN 3.6   No results for input(s): "LIPASE", "AMYLASE" in the last 168 hours. No results for input(s): "AMMONIA" in the last 168 hours. Coagulation Profile: No results for input(s): "INR", "PROTIME" in the last 168 hours. Cardiac Enzymes: No results for input(s): "CKTOTAL", "CKMB", "CKMBINDEX", "TROPONINI" in the last 168 hours. BNP (last 3 results) No results for input(s): "PROBNP" in the last 8760 hours. HbA1C: No results for input(s): "HGBA1C" in the last 72 hours. CBG: No results for input(s): "GLUCAP" in the last 168 hours. Lipid Profile: No results for input(s): "CHOL", "HDL", "LDLCALC", "TRIG", "CHOLHDL", "LDLDIRECT" in the last 72 hours. Thyroid Function Tests: No results for input(s): "TSH", "T4TOTAL", "FREET4", "T3FREE", "THYROIDAB" in the last 72 hours. Anemia Panel: No results for input(s): "VITAMINB12", "FOLATE", "FERRITIN", "TIBC", "IRON", "RETICCTPCT" in the last 72 hours. Sepsis Labs: Recent Labs  Lab 04/09/22 1330  LATICACIDVEN 1.4    Recent Results (from the past 240 hour(s))  Culture, blood (Routine X 2) w Reflex to ID Panel     Status: None (Preliminary result)   Collection Time:  04/12/22  7:51 AM   Specimen: BLOOD RIGHT HAND  Result Value Ref Range Status   Specimen Description   Final    BLOOD RIGHT HAND Performed at [facility] [address] [place], [place] [address]    Special Requests   Final    BOTTLES DRAWN AEROBIC AND ANAEROBIC Blood Culture adequate volume Performed at [facility] [address] [place], [place] [address]    Culture   Final    NO GROWTH < 24 HOURS Performed at [facility] [address] [place], [place] [address]    Report Status PENDING  Incomplete  Culture, blood (Routine X 2) w Reflex to ID Panel     Status: None (Preliminary result)   Collection Time: 04/12/22  8:02 AM   Specimen: BLOOD RIGHT HAND  Result Value Ref Range Status   Specimen Description   Final    BLOOD RIGHT HAND Performed at [facility] [address] [place], [place] [address]    Special Requests   Final    BOTTLES DRAWN AEROBIC ONLY Blood Culture adequate volume Performed at [facility] [address] [place], [place] [address]    Culture   Final    NO GROWTH < 24 HOURS Performed at [facility] [address] [place], [place] [address]    Report Status PENDING  Incomplete     Radiology Studies: No results found.  Scheduled Meds:  vitamin B-12  1,000 mcg Oral Daily   heparin  5,000 Units Subcutaneous Q8H   lithium carbonate  600 mg Oral QHS   metoCLOPramide (REGLAN) injection  10 mg Intravenous Q6H   tamoxifen  10 mg Oral Daily   thyroid  90 mg Oral Q0600   Continuous Infusions:  ceFEPime (MAXIPIME) IV 2 g (04/13/22 1222)   metronidazole 500 mg (04/13/22 0058)   vancomycin 1,250 mg (04/13/22 1015)     LOS: 3 days    Time spent: 40 mins    Duard Brady, MD [facility]   If 7PM-7AM, please contact night-coverage

## 2022-04-14 DIAGNOSIS — F419 Anxiety disorder, unspecified: Secondary | ICD-10-CM

## 2022-04-14 DIAGNOSIS — J45909 Unspecified asthma, uncomplicated: Secondary | ICD-10-CM | POA: Diagnosis not present

## 2022-04-14 DIAGNOSIS — N61 Mastitis without abscess: Secondary | ICD-10-CM | POA: Diagnosis not present

## 2022-04-14 DIAGNOSIS — E039 Hypothyroidism, unspecified: Secondary | ICD-10-CM | POA: Diagnosis not present

## 2022-04-14 MED ORDER — DOXYCYCLINE HYCLATE 100 MG PO TABS: 100.0000 mg | ORAL_TABLET | Freq: Two times a day (BID) | ORAL | 0 refills | Status: AC

## 2022-04-14 NOTE — Hospital Course (Addendum)
Taken from H&P.  This 57 y.o. female with PMH  significant of breast cancer (invasive ductal carcinoma) status post bilateral lumpectomies in 01/2020 and completed adjuvant radiation in 04/2020, currently on tamoxifen.  Also has history of asthma, hypothyroidism, anxiety, bipolar disorder. She presented to the ED with redness, pain, and swelling of her left breast concerning for cellulitis.  CT chest with contrast showing minimal subsegmental atelectasis or scarring involving the lingular portion of the left upper lobe and stable 5 mm right middle lobe nodule. Patient received Norco, Toradol, morphine, cefazolin, and 1 L normal saline bolus in the ED.    Patient states she had breast cancer and received radiation in 2021.  States since then there is an area next to her left nipple which is slightly cracked has not completely healed.  For the past few weeks she has had noticed clear drainage from this area and it has been itching.  Today she noticed increasing redness and swelling of her left breast and it is quite painful.  No fevers at home.  She has had intermittent central chest pain as well over the past few days and reports recent long distance travel.  Denies shortness of breath or leg pain/swelling.  Chest pain is worse when taking deep breaths.  Patient was treated for left breast cellulitis.  CT chest without evidence of necrotizing soft tissue infection.  Ultrasound of left breast shows findings consistent with cellulitis or small abscesses.  General surgery was also consulted and she received broad-spectrum IV antibiotics.  3/29: Vital stable.  Remained afebrile over the past 24 hours.  Clinically seems improving. Patient will need a very close follow-up appointment either with primary care or breast center and if continue to have symptoms then she will need a punch biopsy to rule out underlying inflammatory breast cancer. Blood cultures remain negative.  Patient is otherwise stable with  significant improvement in her erythema, edema and tenderness.  Discussed with surgery and they recommended going home on doxycycline for 10 days, they will follow-up as an outpatient for a repeat ultrasound to rule out any abscess and if her symptoms persist she might get a punch biopsy to rule out any inflammatory breast cancer.  She will continue the rest of her home medications and need to have a close follow-up with her providers for further management.

## 2022-04-14 NOTE — Progress Notes (Signed)
[facility] Progress Note     Subjective: CC: wants to go home   Having hard time sleeping here.  Reports continued significant improvement as far as the left breast pain goes.  Objective: Vital signs in last 24 hours: Temp:  [98.3 F (36.8 C)-98.6 F (37 C)] 98.6 F (37 C) (03/28 1922) Pulse Rate:  [86-88] 86 (03/28 1922) Resp:  [18] 18 (03/28 1922) BP: (104-109)/(65-68) 109/68 (03/28 1922) SpO2:  [98 %] 98 % (03/28 1922) Last BM Date : 04/10/22  Intake/Output from previous day: No intake/output data recorded. Intake/Output this shift: No intake/output data recorded.  PE: Gen:  Alert, NAD, pleasant Card:  Regular rate and rhythm, pedal pulses 2+ BL Pulm:  Normal effort ORA  Chest: left breast with erythema and induration. No fluctuance or drainage.  It looks stable to slightly improved compared to yesterday's photo, but is almost nontender this morning. Skin: warm and dry, no rashes  Psych: A&Ox3   Lab Results:  Recent Labs    04/12/22 0417  WBC 8.1  HGB 11.6*  HCT 36.9  PLT 76*    BMET Recent Labs    04/12/22 0417  NA 136  K 4.2  CL 107  CO2 24  GLUCOSE 107*  BUN 14  CREATININE 0.90  CALCIUM 8.5*    PT/INR No results for input(s): "LABPROT", "INR" in the last 72 hours. CMP     Component Value Date/Time   NA 136 04/12/2022 0417   NA 143 04/21/2020 0843   K 4.2 04/12/2022 0417   CL 107 04/12/2022 0417   CO2 24 04/12/2022 0417   GLUCOSE 107 (H) 04/12/2022 0417   BUN 14 04/12/2022 0417   BUN 13 04/21/2020 0843   CREATININE 0.90 04/12/2022 0417   CREATININE 1.16 (H) 11/28/2021 1308   CALCIUM 8.5 (L) 04/12/2022 0417   PROT 6.9 04/09/2022 1330   ALBUMIN 3.6 04/09/2022 1330   AST 17 04/09/2022 1330   AST 16 11/28/2021 1308   ALT 15 04/09/2022 1330   ALT 15 11/28/2021 1308   ALKPHOS 44 04/09/2022 1330   BILITOT 0.8 04/09/2022 1330   BILITOT 0.6 11/28/2021 1308   GFRNONAA >60 04/12/2022 0417   GFRNONAA 55 (L) 11/28/2021 1308    GFRAA >60 06/25/2014 1948   Lipase     Component Value Date/Time   LIPASE 20 (L) 06/25/2014 1948       Studies/Results: No results found.  Anti-infectives: Anti-infectives (From admission, onward)    Start     Dose/Rate Route Frequency Ordered Stop   04/11/22 1400  piperacillin-tazobactam (ZOSYN) IVPB 3.375 g  Status:  Discontinued        3.375 g 12.5 mL/hr over 240 Minutes Intravenous Every 8 hours 04/11/22 1051 04/11/22 1126   04/11/22 1330  metroNIDAZOLE (FLAGYL) IVPB 500 mg        500 mg 100 mL/hr over 60 Minutes Intravenous Every 12 hours 04/11/22 1126     04/11/22 1230  ceFEPIme (MAXIPIME) 2 g in sodium chloride 0.9 % 100 mL IVPB        2 g 200 mL/hr over 30 Minutes Intravenous Every 12 hours 04/11/22 1126     04/11/22 1100  vancomycin (VANCOREADY) IVPB 1250 mg/250 mL        1,250 mg 166.7 mL/hr over 90 Minutes Intravenous Every 24 hours 04/11/22 0951     04/09/22 2100  ceFAZolin (ANCEF) IVPB 1 g/50 mL premix  Status:  Discontinued        1  g 100 mL/hr over 30 Minutes Intravenous Every 8 hours 04/09/22 1933 04/11/22 0953   04/09/22 1315  ceFAZolin (ANCEF) IVPB 2g/100 mL premix        2 g 200 mL/hr over 30 Minutes Intravenous  Once 04/09/22 1301 04/09/22 1500        Assessment/Plan  Hx of invasive ductal carcinoma s/p bilateral lumpectomy with re-excision of left breast and s/p radiation  L breast cellulitis  - currently on tamoxifen, followed by Dr. Chryl Heck  - last mammogram 10/2021 without evidence of malignancy, last MRI 04/2021 without evidence of malignancy - US of the breast 3/25 with no significant fluid collection - WBC 8.1 3/27 from 11 on admission, afebrile now but previously spiking temps up to 103 -Okay to transition to oral antibiotics and plan for discharge home.  Okay with discharge home today.  She will need close follow-up either with [facility] office or breast center ideally on Monday.  If persistent erythema her breast surgeon may want to consider punch  biopsy to rule out inflammatory breast cancer.  Question whether this may be radiation recall issue as well, seems less likely.   FEN: HH diet  VTE: SQH ID: vanc, blood Cx 3/27 NGTD   LOS: 4 days   I reviewed nursing notes, hospitalist notes, last 24 h vitals and pain scores, last 48 h intake and output, last 24 h labs and trends, and last 24 h imaging results.  This care required straight-forward level of medical decision making.   Clovis Riley MD [facility] Please see Amion for pager number during day hours 7:00am-4:30pm

## 2022-04-14 NOTE — Discharge Summary (Signed)
Physician Discharge Summary   Patient: Cheyenne Garner MRN: [id number] DOB: 1965/08/07  Admit date:     04/09/2022  Discharge date: 04/14/22  Discharge Physician: Lorella Nimrod   PCP: Mosie Lukes, MD   Recommendations at discharge:  Follow-up with general surgery Follow-up with breast center Follow-up with primary care provider  Discharge Diagnoses: Principal Problem:   Cellulitis of breast Active Problems:   Asthma, chronic   Hypothyroidism   Anxiety   Chest pain   History of breast cancer   Thrombocytopenia [facility])   Hospital Course: Taken from H&P.  This 57 y.o. female with PMH  significant of breast cancer (invasive ductal carcinoma) status post bilateral lumpectomies in 01/2020 and completed adjuvant radiation in 04/2020, currently on tamoxifen.  Also has history of asthma, hypothyroidism, anxiety, bipolar disorder. She presented to the ED with redness, pain, and swelling of her left breast concerning for cellulitis.  CT chest with contrast showing minimal subsegmental atelectasis or scarring involving the lingular portion of the left upper lobe and stable 5 mm right middle lobe nodule. Patient received Norco, Toradol, morphine, cefazolin, and 1 L normal saline bolus in the ED.    Patient states she had breast cancer and received radiation in 2021.  States since then there is an area next to her left nipple which is slightly cracked has not completely healed.  For the past few weeks she has had noticed clear drainage from this area and it has been itching.  Today she noticed increasing redness and swelling of her left breast and it is quite painful.  No fevers at home.  She has had intermittent central chest pain as well over the past few days and reports recent long distance travel.  Denies shortness of breath or leg pain/swelling.  Chest pain is worse when taking deep breaths.  Patient was treated for left breast cellulitis.  CT chest without evidence of necrotizing soft tissue  infection.  Ultrasound of left breast shows findings consistent with cellulitis or small abscesses.  General surgery was also consulted and she received broad-spectrum IV antibiotics.  3/29: Vital stable.  Remained afebrile over the past 24 hours.  Clinically seems improving. Patient will need a very close follow-up appointment either with primary care or breast center and if continue to have symptoms then she will need a punch biopsy to rule out underlying inflammatory breast cancer. Blood cultures remain negative.  Patient is otherwise stable with significant improvement in her erythema, edema and tenderness.  Discussed with surgery and they recommended going home on doxycycline for 10 days, they will follow-up as an outpatient for a repeat ultrasound to rule out any abscess and if her symptoms persist she might get a punch biopsy to rule out any inflammatory breast cancer.  She will continue the rest of her home medications and need to have a close follow-up with her providers for further management.   Consultants: General surgery Procedures performed: None Disposition: Home Diet recommendation:  Discharge Diet Orders (From admission, onward)     Start     Ordered   04/14/22 0000  Diet - low sodium heart healthy        04/14/22 1136           Regular diet DISCHARGE MEDICATION: Allergies as of 04/14/2022       Reactions   Gentamycin [gentamicin] Other (See Comments)   Reacted to eye ointment, swelling red painful eyes        Medication List  TAKE these medications    albuterol 108 (90 Base) MCG/ACT inhaler Commonly known as: VENTOLIN HFA Inhale 2 puffs into the lungs every 6 (six) hours as needed for wheezing or shortness of breath.   ASHWAGANDHA PO Take 2 tablets by mouth as needed (anxiety).   CALCIUM PO Take 2 tablets by mouth in the morning, at noon, and at bedtime. Bone Up   doxycycline 100 MG tablet Commonly known as: VIBRA-TABS Take 1 tablet (100 mg  total) by mouth 2 (two) times daily for 10 days.   EYE DROPS OP Place 1 drop into both eyes in the morning and at bedtime. Systemic Balance   lithium 300 MG tablet Take 300 mg by mouth 2 (two) times daily.   NP Thyroid 90 MG tablet Generic drug: thyroid Take 1 tablet (90 mg total) by mouth daily.   PROBIOTIC-10 PO Take 1 capsule by mouth at bedtime.   tamoxifen 10 MG tablet Commonly known as: NOLVADEX Take 1 tablet (10 mg total) by mouth daily.   VITAMIN B-12 PO Take 1 capsule by mouth in the morning and at bedtime.   VITAMIN D PO Take 5,000 Units by mouth daily.   Zinc 50 MG Tabs Take 50 mg by mouth daily.        Follow-up Information     Jovita Kussmaul, MD. Call.   Specialty: General Surgery Why: As needed if left breast cellulitis not improving Contact information: [place] [address] [phone]                Discharge Exam: Danley Danker Weights   04/09/22 1243  Weight: 70.7 kg   General. In no acute distress.  Left breast with improving erythema and edema. Pulmonary.  Lungs clear bilaterally, normal respiratory effort. CV.  Regular rate and rhythm, no JVD, rub or murmur. Abdomen.  Soft, nontender, nondistended, BS positive. CNS.  Alert and oriented .  No focal neurologic deficit. Extremities.  No edema, no cyanosis, pulses intact and symmetrical. Psychiatry.  Judgment and insight appears normal.   Condition at discharge: stable  The results of significant diagnostics from this hospitalization (including imaging, microbiology, ancillary and laboratory) are listed below for reference.   Imaging Studies: [place] LIMITED ULTRASOUND INCLUDING AXILLA LEFT BREAST   Result Date: 04/11/2022 CLINICAL DATA:  Abscess EXAM: ULTRASOUND OF THE LEFT BREAST COMPARISON:  10/21/2019. FINDINGS: Images were obtained of the left breast demonstrates diffuse skin thickening. Findings are most severe in the upper outer quadrant where there is  also evidence of significant subcutaneous edema. At 1 o'clock there is complex fluid which is superficial and may represent a boil or abscess measuring 2 cm. Follow up of this finding recommended after if the patient has an abscess clinically, then antibiotic therapy recommended with a follow up examination to confirm resolution. Images of the left axilla demonstrated normal node and no suspicious adenopathy. IMPRESSION: 1. Diffuse nonspecific skin thickening and subcutaneous edema which can be seen with cellulitis, third spacing or post XRT. Correlation with clinical history is 2. 2 cm complex superficial fluid collection at 1 o'clock that could be abscess in the proper clinical setting. Therapy recommended with follow up ultrasound in 3 months to ensure resolution as a precaution. RECOMMENDATION: Clinical management and three-month ultrasound follow up. BI-RADS CATEGORY  3: Probably benign. Electronically Signed   By: Sammie Bench M.D.   On: 04/11/2022 12:32   CT Chest W Contrast  Result Date: 04/09/2022 CLINICAL DATA:  Soft tissue  infection.  History of breast cancer. EXAM: CT CHEST WITH CONTRAST TECHNIQUE: Multidetector CT imaging of the chest was performed during intravenous contrast administration. RADIATION DOSE REDUCTION: This exam was performed according to the departmental dose-optimization program which includes automated exposure control, adjustment of the mA and/or kV according to patient size and/or use of iterative reconstruction technique. CONTRAST:  9mL OMNIPAQUE IOHEXOL 300 MG/ML  SOLN COMPARISON:  April 21, 2020. FINDINGS: Cardiovascular: No significant vascular findings. Normal heart size. No pericardial effusion. Mediastinum/Nodes: No enlarged mediastinal, hilar, or axillary lymph nodes. Thyroid gland, trachea, and esophagus demonstrate no significant findings. Lungs/Pleura: No pneumothorax or pleural effusion is noted. Stable 5 mm nodule is noted in right middle lobe which can be  considered benign at this point with no further follow-up required. Mild biapical scarring is noted. Minimal subsegmental atelectasis or scarring is seen involving the lingular portion of the left upper lobe. Upper Abdomen: No acute abnormality. Musculoskeletal: No chest wall abnormality. No acute or significant osseous findings. IMPRESSION: Minimal subsegmental atelectasis or scarring is seen involving the lingular portion of the left upper lobe. Stable 5 mm right middle lobe nodule is noted which can be considered benign at this point with no further follow-up required. Electronically Signed   By: Marijo Conception M.D.   On: 04/09/2022 15:12    Microbiology: Results for orders placed or performed during the hospital encounter of 04/09/22  Culture, blood (Routine X 2) w Reflex to ID Panel     Status: None (Preliminary result)   Collection Time: 04/12/22  7:51 AM   Specimen: BLOOD RIGHT HAND  Result Value Ref Range Status   Specimen Description   Final    BLOOD RIGHT HAND Performed at [facility] [address] [place], [place] [address]    Special Requests   Final    BOTTLES DRAWN AEROBIC AND ANAEROBIC Blood Culture adequate volume Performed at [facility] [address] [place], [place] [address]    Culture   Final    NO GROWTH 2 DAYS Performed at [facility] [address] [place], [place] [address]    Report Status PENDING  Incomplete  Culture, blood (Routine X 2) w Reflex to ID Panel     Status: None (Preliminary result)   Collection Time: 04/12/22  8:02 AM   Specimen: BLOOD RIGHT HAND  Result Value Ref Range Status   Specimen Description   Final    BLOOD RIGHT HAND Performed at [facility] [address], [place], [place] [address]    Special Requests   Final    BOTTLES DRAWN AEROBIC ONLY Blood Culture adequate volume Performed at [facility] [address], [place], [place] [address]     Culture   Final    NO GROWTH 2 DAYS Performed at [facility] [address], [place], [place] [address]    Report Status PENDING  Incomplete    Labs: CBC: Recent Labs  Lab 04/09/22 1330 04/10/22 0339 04/11/22 0401 04/12/22 0417  WBC 10.6* 11.8* 8.5 8.1  NEUTROABS 9.5*  --   --   --   HGB 13.8 12.8 11.9* 11.6*  HCT 42.4 39.2 37.1 36.9  MCV 94.0 93.1 94.9 96.9  PLT 112* 93* 72* 76*   Basic Metabolic Panel: Recent Labs  Lab 04/09/22 1330 04/11/22 0401 04/12/22 0417  NA 138 134* 136  K 3.8 3.2* 4.2  CL 105 105 107  CO2 27 24  24  GLUCOSE 120* 129* 107*  BUN 16 20 14   CREATININE 0.97 1.03* 0.90  CALCIUM 8.7* 8.3* 8.5*  MG  --  2.2  --   PHOS  --  2.3*  --    Liver Function Tests: Recent Labs  Lab 04/09/22 1330  AST 17  ALT 15  ALKPHOS 44  BILITOT 0.8  PROT 6.9  ALBUMIN 3.6   CBG: No results for input(s): "GLUCAP" in the last 168 hours.  Discharge time spent: greater than 30 minutes.   This record has been created using Systems analyst. Errors have been sought and corrected,but may not always be located. Such creation errors do not reflect on the standard of care.   Signed: Lorella Nimrod, MD [facility] 04/14/2022

## 2022-04-17 ENCOUNTER — Other Ambulatory Visit: Payer: Self-pay | Admitting: General Surgery

## 2022-04-17 ENCOUNTER — Telehealth: Payer: Self-pay | Admitting: *Deleted

## 2022-04-17 ENCOUNTER — Encounter: Payer: Self-pay | Admitting: *Deleted

## 2022-04-17 DIAGNOSIS — N611 Abscess of the breast and nipple: Secondary | ICD-10-CM

## 2022-04-17 LAB — CULTURE, BLOOD (ROUTINE X 2)
Culture: NO GROWTH
Culture: NO GROWTH
Special Requests: ADEQUATE
Special Requests: ADEQUATE

## 2022-04-17 NOTE — Transitions of Care (Post Inpatient/ED Visit) (Signed)
04/17/2022  Name: Cheyenne Garner MRN: [id number] DOB: November 04, 1965  Today's TOC FU Call Status: Today's TOC FU Call Status:: Successful TOC FU Call Competed TOC FU Call Complete Date: 04/17/22  Transition Care Management Follow-up Telephone Call Date of Discharge: 04/14/22 Discharge Facility: [facility]) Type of Discharge: Inpatient Admission Primary Inpatient Discharge Diagnosis:: cellulitis of (L) breats- post breast CA radiation treatments How have you been since you were released from the hospital?: Better ("Things are going better now that I am at home-- finally getting some sleep and good rest; taking the antibiotic like they told me to. I have heard from the surgeon's office, I have an ultrasound scheduled tomorrow, the PA is going to see me soon") Any questions or concerns?: No  Items Reviewed: Did you receive and understand the discharge instructions provided?: Yes (thoroughly reviewed with patient who verbalizes excellent understanding of same) Medications obtained and verified?: Yes (Medications Reviewed) (Full medication review completed; no concerns or discrepancies identified; confirmed patient obtained/ is taking all newly Rx'd medications as instructed; self-manages medications and denies questions/ concerns around medications today) Any new allergies since your discharge?: No Dietary orders reviewed?: Yes Type of Diet Ordered:: Regular Do you have support at home?: Yes People in Home: spouse Name of Support/Comfort Primary Source: spouse; patient reports she is independent in self-care; husband assists as needed/ indicated  Home Care and Equipment/Supplies: [place] Ordered?: No Any new equipment or medical supplies ordered?: No  Functional Questionnaire: Do you need assistance with bathing/showering or dressing?: No Do you need assistance with meal preparation?: No Do you need assistance with eating?: No Do you have difficulty maintaining  continence: No Do you need assistance with getting out of bed/getting out of a chair/moving?: No Do you have difficulty managing or taking your medications?: No  Follow up appointments reviewed: PCP Follow-up appointment confirmed?: Yes (care coordination outreach in real-time with scheduling care guide to successfully schedule hospital follow up PCP appointment: patient refused multiple sooner dates available) Date of PCP follow-up appointment?: 04/26/22 Follow-up Provider: PCP- Dr. Lorelei Pont- covering for Dr. Randel Pigg for [facility]; [facility] Follow-up appointment confirmed?: Yes Date of Specialist follow-up appointment?: 04/16/22 Follow-Up Specialty Provider:: Surgeon-- reports scheduled to have [place] tomorrow and then will be worked in by surgical provider PA around [place] Do you need transportation to your follow-up appointment?: No Do you understand care options if your condition(s) worsen?: Yes-patient verbalized understanding  SDOH Interventions Today    Flowsheet Row Most Recent Value  SDOH Interventions   Food Insecurity Interventions Intervention Not Indicated  Transportation Interventions Intervention Not Indicated  [drives self,  husband assists as indictaed/ needed]      TOC Interventions Today    Flowsheet Row Most Recent Value  TOC Interventions   TOC Interventions Discussed/Reviewed TOC Interventions Discussed, Arranged PCP follow up less than 12 days/Care Guide scheduled  [Patient declines need for ongoing/ further care coordination outreach]      Interventions Today    Flowsheet Row Most Recent Value  Chronic Disease   Chronic disease during today's visit Other  [breast CA]  General Interventions   General Interventions Discussed/Reviewed General Interventions Discussed, Doctor Visits  Doctor Visits Discussed/Reviewed Doctor Visits Discussed, PCP, Specialist  PCP/Specialist Visits Compliance with follow-up visit  Education Interventions   Education Provided  Provided Education  Provided Verbal Education On Medication  [benefit of ongoing probiotic therapy inn setting of current antibiotic therapy]  Nutrition Interventions   Nutrition Discussed/Reviewed Nutrition Discussed  Pharmacy Interventions  Pharmacy Dicussed/Reviewed Pharmacy Topics Discussed  [Full medication review with updating medication list in EHR per patient report]      Oneta Rack, RN, BSN, CCRN Alumnus RN CM Care Coordination/ Transition of [place] [facility] [phone]: direct office

## 2022-04-18 ENCOUNTER — Ambulatory Visit
Admission: RE | Admit: 2022-04-18 | Discharge: 2022-04-18 | Disposition: A | Discharge status: Home / Self Care | Payer: BC Managed Care – PPO | Source: Ambulatory Visit | Attending: General Surgery | Admitting: General Surgery

## 2022-04-18 ENCOUNTER — Other Ambulatory Visit: Payer: Self-pay | Admitting: General Surgery

## 2022-04-18 ENCOUNTER — Ambulatory Visit: Payer: BC Managed Care – PPO

## 2022-04-18 DIAGNOSIS — Z853 Personal history of malignant neoplasm of breast: Secondary | ICD-10-CM | POA: Diagnosis not present

## 2022-04-18 DIAGNOSIS — R922 Inconclusive mammogram: Secondary | ICD-10-CM | POA: Diagnosis not present

## 2022-04-18 DIAGNOSIS — N611 Abscess of the breast and nipple: Secondary | ICD-10-CM

## 2022-04-18 DIAGNOSIS — N6489 Other specified disorders of breast: Secondary | ICD-10-CM | POA: Diagnosis not present

## 2022-04-24 NOTE — Progress Notes (Unsigned)
[facility] [address], [address] [place], [place] [phone]  Date:  04/26/2022   Name:  Cheyenne MiloJaime L Garner   DOB:  04/19/1965   MRN:  [id number]  PCP:  Bradd CanaryBlyth, Stacey A, MD    Chief Complaint: No chief complaint on file.   History of Present Illness:  Cheyenne MiloJaime L Group is a 57 y.o. very pleasant female patient who presents with the following:  Patient seen today for hospital discharge follow-up.  She is a primary patient of my partner Dr. Abner GreenspanBlyth whom I have seen once previously about 2 years ago  She was seen in the ER and then admitted on 3/24-discharged home on 3/29 with concern of breast cellulitis Discharge Diagnoses: Principal Problem:   Cellulitis of breast Active Problems:   Asthma, chronic   Hypothyroidism   Anxiety   Chest pain   History of breast cancer   Thrombocytopenia [facility](HCC) Hospital Course: This 57 y.o. female with PMH  significant of breast cancer (invasive ductal carcinoma) status post bilateral lumpectomies in 01/2020 and completed adjuvant radiation in 04/2020, currently on tamoxifen.  Also has history of asthma, hypothyroidism, anxiety, bipolar disorder. She presented to the ED with redness, pain, and swelling of her left breast concerning for cellulitis.  CT chest with contrast showing minimal subsegmental atelectasis or scarring involving the lingular portion of the left upper lobe and stable 5 mm right middle lobe nodule. Patient received Norco, Toradol, morphine, cefazolin, and 1 L normal saline bolus in the ED.    Patient states she had breast cancer and received radiation in 2021.  States since then there is an area next to her left nipple which is slightly cracked has not completely healed.  For the past few weeks she has had noticed clear drainage from this area and it has been itching.  Today she noticed increasing redness and swelling of her left breast and it is quite painful.  No fevers at home.   She has had intermittent central chest pain as well over the past few days and reports recent long distance travel.  Denies shortness of breath or leg pain/swelling.  Chest pain is worse when taking deep breaths.   Patient was treated for left breast cellulitis.  CT chest without evidence of necrotizing soft tissue infection.  Ultrasound of left breast shows findings consistent with cellulitis or small abscesses.  General surgery was also consulted and she received broad-spectrum IV antibiotics.   3/29: Vital stable.  Remained afebrile over the past 24 hours.  Clinically seems improving. Patient will need a very close follow-up appointment either with primary care or breast center and if continue to have symptoms then she will need a punch biopsy to rule out underlying inflammatory breast cancer. Blood cultures remain negative.   Patient is otherwise stable with significant improvement in her erythema, edema and tenderness.  Discussed with surgery and they recommended going home on doxycycline for 10 days, they will follow-up as an outpatient for a repeat ultrasound to rule out any abscess and if her symptoms persist she might get a punch biopsy to rule out any inflammatory breast cancer.   She will continue the rest of her home medications and need to have a close follow-up with her providers for further management.     Consultants: General surgery Procedures performed: None  Patient Active Problem List   Diagnosis Date Noted   Cellulitis of breast 04/09/2022   Chest pain  04/09/2022   History of breast cancer 04/09/2022   Thrombocytopenia 04/09/2022   History of COVID-19 03/28/2022   Hyperglycemia 11/08/2020   Elevated sed rate 11/08/2020   Milia 07/14/2020   Malignant neoplasm of overlapping sites of left breast in female, estrogen receptor positive 05/18/2020   Genetic testing 04/07/2020   Hypothyroidism    Anxiety    Allergies    FH: thoracic aortic aneurysm 03/23/2020   Family  history of breast cancer    Family history of ovarian cancer    Family history of bladder cancer    Family history of prostate cancer    Breast cancer in female 03/09/2020   Bilateral dry eyes 01/27/2019   Cervical cancer screening 09/12/2016   Hx of colonic polyp 09/12/2016   Colon polyp 09/12/2016   Preventative health care 07/23/2015   Asthma, chronic    Thyroid disease    Bipolar affective    Urinary incontinence 12/25/2011    Past Medical History:  Diagnosis Date   Allergies    Anxiety    Asthma    cough variant asthma developed in last couple of years   Bipolar 1 disorder    Breast cancer 02/12/2020   left lumpectomy IDC w/radiation no chemo   Cervical cancer screening 09/12/2016   Colon polyp 09/12/2016   Family history of bladder cancer    Family history of breast cancer    Family history of ovarian cancer    Family history of prostate cancer    Hx of colonic polyp 09/12/2016   Colonoscopy 2017 with 1 small polyp, done by Dr Dulce Sellar, repeat colonoscopy in 5 years, 2022   Hypothyroidism    Personal history of radiation therapy    Preventative health care 07/23/2015   Thyroid disease     Past Surgical History:  Procedure Laterality Date   APPENDECTOMY     BREAST LUMPECTOMY WITH RADIOACTIVE SEED LOCALIZATION Bilateral 02/12/2020   Procedure: BILATERAL BREAST LUMPECTOMY WITH RADIOACTIVE SEED LOCALIZATION;  Surgeon: Griselda Miner, MD;  Location: [facility];  Service: General;  Laterality: Bilateral;   RE-EXCISION OF BREAST LUMPECTOMY Left 03/04/2020   Procedure: LEFT BREAST MARGIN REEXCISION;  Surgeon: Griselda Miner, MD;  Location: [facility];  Service: General;  Laterality: Left;   SENTINEL NODE BIOPSY Left 03/04/2020   Procedure: SENTINEL NODE BIOPSY;  Surgeon: Griselda Miner, MD;  Location: [facility];  Service: General;  Laterality: Left;   SKIN BIOPSY     back moles removed (precancer?)    Social History    Tobacco Use   Smoking status: Never   Smokeless tobacco: Never  Substance Use Topics   Alcohol use: Yes    Comment: social   Drug use: No    Family History  Problem Relation Age of Onset   Arthritis Mother    Dementia Mother        alzheimers   Cancer Father        bladder   Hypertension Father    Heart disease Father        MI, s/p triple bypass at age 23   Neuropathy Father        toxic peripheral    Other Father        peripheral neuropathy   Cholelithiasis Sister    Aortic aneurysm Sister 26   Aneurysm Sister        descending aortic    Mental retardation Sister  ADD, schizoaffective d/o   Obesity Sister    Depression Brother    Arthritis Brother        back disease   Prostate cancer Paternal Uncle        spread to bladder, d. 60s/70s   Dementia Maternal Grandmother    Osteoarthritis Maternal Grandmother    Macular degeneration Maternal Grandfather    Osteoarthritis Maternal Grandfather    Congestive Heart Failure Paternal Grandmother    Cancer Paternal Grandfather        young, lung cancer?, heavy smoker   Ovarian cancer Cousin 13       maternal second cousin (MGF's great-niece)   Breast cancer Cousin 89       paternal first cousin   Cancer Niece 11       soft tissue sarcoma    Allergies  Allergen Reactions   Gentamycin [Gentamicin] Other (See Comments)    Reacted to eye ointment, swelling red painful eyes    Medication list has been reviewed and updated.  Current Outpatient Medications on File Prior to Visit  Medication Sig Dispense Refill   albuterol (VENTOLIN HFA) 108 (90 Base) MCG/ACT inhaler Inhale 2 puffs into the lungs every 6 (six) hours as needed for wheezing or shortness of breath. 6.7 g 2   ASHWAGANDHA PO Take 2 tablets by mouth as needed (anxiety).     CALCIUM PO Take 2 tablets by mouth in the morning, at noon, and at bedtime. Bone Up     Carboxymethylcellulose Sodium (EYE DROPS OP) Place 1 drop into both eyes in the morning  and at bedtime. Systemic Balance     Cyanocobalamin (VITAMIN B-12 PO) Take 1 capsule by mouth in the morning and at bedtime.     doxycycline (VIBRA-TABS) 100 MG tablet Take 1 tablet (100 mg total) by mouth 2 (two) times daily for 10 days. 20 tablet 0   lithium 300 MG tablet Take 300 mg by mouth 2 (two) times daily.     Probiotic Product (PROBIOTIC-10 PO) Take 1 capsule by mouth at bedtime.     tamoxifen (NOLVADEX) 10 MG tablet Take 1 tablet (10 mg total) by mouth daily. 90 tablet 1   thyroid (NP THYROID) 90 MG tablet Take 1 tablet (90 mg total) by mouth daily. 30 tablet 5   VITAMIN D PO Take 5,000 Units by mouth daily.     Zinc 50 MG TABS Take 50 mg by mouth daily.     No current facility-administered medications on file prior to visit.    Review of Systems:  As per HPI- otherwise negative.   Physical Examination: There were no vitals filed for this visit. There were no vitals filed for this visit. There is no height or weight on file to calculate BMI. Ideal Body Weight:    GEN: no acute distress. HEENT: Atraumatic, Normocephalic.  Ears and Nose: No external deformity. CV: RRR, No M/G/R. No JVD. No thrill. No extra heart sounds. PULM: CTA B, no wheezes, crackles, rhonchi. No retractions. No resp. distress. No accessory muscle use. ABD: S, NT, ND, +BS. No rebound. No HSM. EXTR: No c/c/e PSYCH: Normally interactive. Conversant.    Assessment and Plan: ***  Signed Abbe Amsterdam, MD

## 2022-04-26 ENCOUNTER — Encounter: Payer: Self-pay | Admitting: Family Medicine

## 2022-04-26 ENCOUNTER — Ambulatory Visit: Payer: BC Managed Care – PPO | Admitting: Family Medicine

## 2022-04-26 VITALS — BP 118/72 | HR 94 | Resp 18 | Ht 66.0 in | Wt 155.4 lb

## 2022-04-26 DIAGNOSIS — N61 Mastitis without abscess: Secondary | ICD-10-CM

## 2022-04-26 DIAGNOSIS — Z170 Estrogen receptor positive status [ER+]: Secondary | ICD-10-CM | POA: Diagnosis not present

## 2022-04-26 DIAGNOSIS — C50412 Malignant neoplasm of upper-outer quadrant of left female breast: Secondary | ICD-10-CM | POA: Diagnosis not present

## 2022-04-26 NOTE — Patient Instructions (Signed)
It was good to see you today- I am glad you are feeling better!  Please let us know if we can do anything else to help Please recheck platelets within the next month or so

## 2022-05-17 DIAGNOSIS — N61 Mastitis without abscess: Secondary | ICD-10-CM | POA: Diagnosis not present

## 2022-05-17 DIAGNOSIS — Z170 Estrogen receptor positive status [ER+]: Secondary | ICD-10-CM | POA: Diagnosis not present

## 2022-05-17 DIAGNOSIS — C50412 Malignant neoplasm of upper-outer quadrant of left female breast: Secondary | ICD-10-CM | POA: Diagnosis not present

## 2022-05-29 ENCOUNTER — Inpatient Hospital Stay: Payer: BC Managed Care – PPO

## 2022-05-29 ENCOUNTER — Inpatient Hospital Stay: Payer: BC Managed Care – PPO | Attending: Hematology and Oncology | Admitting: Hematology and Oncology

## 2022-05-29 ENCOUNTER — Other Ambulatory Visit: Payer: Self-pay

## 2022-05-29 ENCOUNTER — Other Ambulatory Visit: Payer: Self-pay | Admitting: *Deleted

## 2022-05-29 VITALS — BP 138/86 | HR 98 | Temp 97.5°F | Resp 14 | Wt 154.4 lb

## 2022-05-29 DIAGNOSIS — C50912 Malignant neoplasm of unspecified site of left female breast: Secondary | ICD-10-CM | POA: Insufficient documentation

## 2022-05-29 DIAGNOSIS — Z170 Estrogen receptor positive status [ER+]: Secondary | ICD-10-CM | POA: Diagnosis not present

## 2022-05-29 DIAGNOSIS — Z8042 Family history of malignant neoplasm of prostate: Secondary | ICD-10-CM | POA: Insufficient documentation

## 2022-05-29 DIAGNOSIS — Z8041 Family history of malignant neoplasm of ovary: Secondary | ICD-10-CM | POA: Insufficient documentation

## 2022-05-29 DIAGNOSIS — C50812 Malignant neoplasm of overlapping sites of left female breast: Secondary | ICD-10-CM | POA: Diagnosis not present

## 2022-05-29 DIAGNOSIS — Z803 Family history of malignant neoplasm of breast: Secondary | ICD-10-CM | POA: Insufficient documentation

## 2022-05-29 DIAGNOSIS — Z8052 Family history of malignant neoplasm of bladder: Secondary | ICD-10-CM | POA: Insufficient documentation

## 2022-05-29 DIAGNOSIS — Z79810 Long term (current) use of selective estrogen receptor modulators (SERMs): Secondary | ICD-10-CM | POA: Diagnosis not present

## 2022-05-29 DIAGNOSIS — Z923 Personal history of irradiation: Secondary | ICD-10-CM | POA: Insufficient documentation

## 2022-05-29 DIAGNOSIS — C50011 Malignant neoplasm of nipple and areola, right female breast: Secondary | ICD-10-CM

## 2022-05-29 DIAGNOSIS — D696 Thrombocytopenia, unspecified: Secondary | ICD-10-CM | POA: Insufficient documentation

## 2022-05-29 DIAGNOSIS — Z801 Family history of malignant neoplasm of trachea, bronchus and lung: Secondary | ICD-10-CM | POA: Insufficient documentation

## 2022-05-29 LAB — CBC WITH DIFFERENTIAL (CANCER CENTER ONLY)
Abs Immature Granulocytes: 0 10*3/uL (ref 0.00–0.07)
Basophils Absolute: 0.1 10*3/uL (ref 0.0–0.1)
Basophils Relative: 1 %
Eosinophils Absolute: 0.1 10*3/uL (ref 0.0–0.5)
Eosinophils Relative: 2 %
HCT: 42.4 % (ref 36.0–46.0)
Hemoglobin: 13.9 g/dL (ref 12.0–15.0)
Immature Granulocytes: 0 %
Lymphocytes Relative: 35 %
Lymphs Abs: 1.9 10*3/uL (ref 0.7–4.0)
MCH: 30.3 pg (ref 26.0–34.0)
MCHC: 32.8 g/dL (ref 30.0–36.0)
MCV: 92.4 fL (ref 80.0–100.0)
Monocytes Absolute: 0.4 10*3/uL (ref 0.1–1.0)
Monocytes Relative: 7 %
Neutro Abs: 2.9 10*3/uL (ref 1.7–7.7)
Neutrophils Relative %: 55 %
Platelet Count: 147 10*3/uL — ABNORMAL LOW (ref 150–400)
RBC: 4.59 MIL/uL (ref 3.87–5.11)
RDW: 12.6 % (ref 11.5–15.5)
WBC Count: 5.2 10*3/uL (ref 4.0–10.5)
nRBC: 0 % (ref 0.0–0.2)

## 2022-05-29 LAB — CMP (CANCER CENTER ONLY)
ALT: 12 U/L (ref 0–44)
AST: 14 U/L — ABNORMAL LOW (ref 15–41)
Albumin: 4.2 g/dL (ref 3.5–5.0)
Alkaline Phosphatase: 54 U/L (ref 38–126)
Anion gap: 5 (ref 5–15)
BUN: 17 mg/dL (ref 6–20)
CO2: 29 mmol/L (ref 22–32)
Calcium: 9.4 mg/dL (ref 8.9–10.3)
Chloride: 105 mmol/L (ref 98–111)
Creatinine: 0.92 mg/dL (ref 0.44–1.00)
GFR, Estimated: >60 mL/min (ref >60)
Glucose, Bld: 90 mg/dL (ref 70–99)
Potassium: 3.8 mmol/L (ref 3.5–5.1)
Sodium: 139 mmol/L (ref 135–145)
Total Bilirubin: 0.8 mg/dL (ref 0.3–1.2)
Total Protein: 7.2 g/dL (ref 6.5–8.1)

## 2022-05-29 MED ORDER — TAMOXIFEN CITRATE 10 MG PO TABS: 10.0000 mg | ORAL_TABLET | Freq: Every day | ORAL | 1 refills | Status: DC

## 2022-05-29 NOTE — Progress Notes (Signed)
[facility]  [phone] [phone]     ID: Cheyenne Garner DOB: 13-Sep-1965  MR#: [id number]  XLK#:[id number]  Patient Care Team: Bradd Canary, MD as PCP - General (Family Medicine) Thomasene Ripple, DO as PCP - Cardiology (Cardiology) Griselda Miner, MD as Consulting Physician (General Surgery) Dorothy Puffer, MD as Consulting Physician (Radiation Oncology) Willis Modena, MD as Consulting Physician (Gastroenterology) Haverstock, Elvin So, MD as Referring Physician (Dermatology) Donnelly Angelica, RN as Oncology Nurse Navigator Pershing Proud, RN as Oncology Nurse Navigator Huel Cote, MD as Consulting Physician (Obstetrics and Gynecology) Rachel Moulds, MD  CHIEF COMPLAINT: Estrogen receptor positive breast cancer  CURRENT TREATMENT:  None  INTERVAL HISTORY:  Cheyenne Garner returns today for follow up. Since her last visit with [place], she is improving, her left breast redness and swelling have significantly improved although there is some persistent swelling.  The bottom portion of the left breast has thickened indurated skin but otherwise she actually feels remarkably better.  She is understandably worried about the possibility of inflammatory breast cancer.  She is also following up with Dr. Carolynne Edouard.  She has an MRI scheduled this week.  She is otherwise exercising regularly.  No other health complaints reported. Rest of the pertinent 10 point ROS reviewed and neg.  REVIEW OF SYSTEMS:  COVID 19 VACCINATION STATUS: Pfizer x1, most recently 01/2019   HISTORY OF CURRENT ILLNESS: From the original intake note:  Cheyenne Garner was initially evaluated in 05/2017 for a right nipple/breast mass. Diagnostic mammography showed no evidence of malignancy, and she was subsequently referred to Dr. Carolynne Edouard for further evaluation. She elected for follow up of the lesion. She was then seen by dermatology, who drained the lesion. The lesion resolved but began to reoccur 6  months later. She underwent bilateral diagnostic mammography with tomography and right breast ultrasonography at [facility] on 10/21/2019 showing: breast density category C; dilated duct and nipple discharge from right breast; no intraductal mass or mammographic abnormality identified; right axilla negative for adenopathy.  She underwent breast MRI on 11/11/2019 showing: breast composition C; 1.8 cm linear clumped non-mass enhancement within lower right breast, 6 o'clock; 8 mm irregular enhancing mass within upper-outer left breast; benign-appearing 1.5 cm nonenhancing fluid-intensity mass/collection within right nipple with probable subareolar extension, corresponding to clinical area of concern.  Accordingly on 12/04/2019 she proceeded to biopsy of the bilateral breast areas in question. The pathology from this procedure ([id number]) showed:  1. Right Breast  - fibrocystic changes  - focal atypical lobular hyperplasia  --concordant   2. Left Breast  - benign breast tissue --discordant  As noted, the right breast biopsy was found to be concordant, but the left breast biopsy was discordant. She proceeded to bilateral lumpectomies on 02/12/2020 under Dr. Carolynne Edouard. Pathology from the procedure ([id number]) showed: 1. Right Breast  - fibrocystic change  - columnar cell change  - usual ductal hyperplasia  - intraductal papilloma  - associated microcalcifications 2. 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 Proliferation marker Ki67 at 2%. HER2 equivocal by immunohistochemistry (2+), but negative by fluorescent in situ hybridization with a signals ratio 1.19 and number per cell 1.73.  She underwent re-excision of the positive  margin, as well as left sentinel lymph  node biopsy, on 03/04/2020. Pathology [id number]) showed: no residual carcinoma; benign lymph nodes (0/3).   Cancer Staging  Breast cancer in female [name] Staging form: Breast, AJCC 8th Edition - Pathologic stage from 03/09/2020: Stage IA (pT1a, pN0, cM0, G2, ER+, PR+, HER2-) - Signed by Lowella Dell, MD on 03/16/2020 Stage prefix: Initial diagnosis Multigene prognostic tests performed: None Histologic grading system: 3 grade system  The patient's subsequent history is as detailed below.   PAST MEDICAL HISTORY: Past Medical History:  Diagnosis Date   Allergies    Anxiety    Asthma    cough variant asthma developed in last couple of years   Bipolar 1 disorder (HCC)    Breast cancer (HCC) 02/12/2020   left lumpectomy IDC w/radiation no chemo   Cervical cancer screening 09/12/2016   Colon polyp 09/12/2016   Family history of bladder cancer    Family history of breast cancer    Family history of ovarian cancer    Family history of prostate cancer    Hx of colonic polyp 09/12/2016   Colonoscopy 2017 with 1 small polyp, done by Dr Dulce Sellar, repeat colonoscopy in 5 years, 2022   Hypothyroidism    Personal history of radiation therapy    Preventative health care 07/23/2015   Thyroid disease     PAST SURGICAL HISTORY: Past Surgical History:  Procedure Laterality Date   APPENDECTOMY     BREAST LUMPECTOMY WITH RADIOACTIVE SEED LOCALIZATION Bilateral 02/12/2020   Procedure: BILATERAL BREAST LUMPECTOMY WITH RADIOACTIVE SEED LOCALIZATION;  Surgeon: Griselda Miner, MD;  Location: [facility];  Service: General;  Laterality: Bilateral;   RE-EXCISION OF BREAST LUMPECTOMY Left 03/04/2020   Procedure: LEFT BREAST MARGIN REEXCISION;  Surgeon: Griselda Miner, MD;  Location: [facility];  Service: General;  Laterality: Left;   SENTINEL NODE BIOPSY Left 03/04/2020   Procedure: SENTINEL NODE BIOPSY;  Surgeon: Griselda Miner,  MD;  Location: [facility];  Service: General;  Laterality: Left;   SKIN BIOPSY     back moles removed (precancer?)    FAMILY HISTORY: Family History  Problem Relation Age of Onset   Arthritis Mother    Dementia Mother        alzheimers   Cancer Father        bladder   Hypertension Father    Heart disease Father        MI, s/p triple bypass at age 32   Neuropathy Father        toxic peripheral    Other Father        peripheral neuropathy   Cholelithiasis Sister    Aortic aneurysm Sister 61   Aneurysm Sister        descending aortic    Mental retardation Sister        ADD, schizoaffective d/o   Obesity Sister    Depression Brother    Arthritis Brother        back disease   Prostate cancer Paternal Uncle        spread to bladder, d. 60s/70s   Dementia Maternal Grandmother    Osteoarthritis Maternal Grandmother    Macular degeneration Maternal Grandfather    Osteoarthritis Maternal Grandfather    Congestive Heart Failure Paternal Grandmother    Cancer Paternal Grandfather        young, lung cancer?, heavy smoker   Ovarian cancer Cousin 42       maternal second cousin (MGF's great-niece)   Breast  cancer Cousin 82       paternal first cousin   Cancer Niece 11       soft tissue sarcoma  From the genetic counselors note (February 2022):  "Ms. Hollett's mother is alive at age 38 and may have had basal cell carcinoma of her face (unconfirmed). There were three maternal aunts. There is no known cancer among maternal aunts/uncles or maternal first cousins. Ms. Bueche maternal grandmother died at age 10 without cancer. Her maternal grandfather died at age 17 without cancer. A maternal second cousin (MGF's great-niece) died from ovarian cancer at age 57. A maternal first cousin once removed (MGF's niece) had an unknown type of gynecologic cancer in her 73s.    Ms. Kunzler father is alive at age 74 and was diagnosed with bladder cancer at age 86. There are two  paternal aunts and four paternal uncles. Once uncle died in his late 62s or early 63s with prostate cancer and bladder cancer. One paternal first cousin was diagnosed with breast cancer around age 1. Ms. Norfleet paternal grandmother died in her 72s without cancer. Her paternal grandfather died in his 69s from an unknown type of cancer (not colon cancer, possibly lung cancer). A paternal first cousin once removed (PGF's nephew) had lung cancer in his 85s, and died in his 27s with prostate cancer and bladder cancer.  "   GYNECOLOGIC HISTORY:   No LMP recorded. Patient is postmenopausal. Menarche: 57 years old Age at first live birth: 57 years old GX P 2 LMP age 76 Contraceptive  HRT yes at least 4 years  Hysterectomy? no BSO? 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  Her husband Cheyenne Garner is an Art gallery manager.  He takes care of our blood analyzer is here and throughout the system.  Son Cheyenne Garner started forestry and lives in [place] where he works for [employer] restoration taking care of their trails.  Son Cheyenne Garner is 32   ADVANCED DIRECTIVES: In the absence of any documents to the contrary the patient's husband is her healthcare power of attorney   HEALTH MAINTENANCE: Social History   Tobacco Use   Smoking status: Never   Smokeless tobacco: Never  Substance Use Topics   Alcohol use: Yes    Comment: social   Drug use: No     Colonoscopy: 02/2015, Dr. Dulce Sellar, recall 2022  PAP: 08/2016, negative  Bone density: Never done   Allergies  Allergen Reactions   Gentamycin [Gentamicin] Other (See Comments)    Reacted to eye ointment, swelling red painful eyes    Current Outpatient Medications  Medication Sig Dispense Refill   albuterol (VENTOLIN HFA) 108 (90 Base) MCG/ACT inhaler Inhale 2 puffs into the lungs every 6 (six) hours as needed for wheezing or shortness of breath. 6.7 g 2   ASHWAGANDHA PO Take 2 tablets by mouth as  needed (anxiety).     CALCIUM PO Take 2 tablets by mouth in the morning, at noon, and at bedtime. Bone Up     Carboxymethylcellulose Sodium (EYE DROPS OP) Place 1 drop into both eyes in the morning and at bedtime. Systemic Balance     Cyanocobalamin (VITAMIN B-12 PO) Take 1 capsule by mouth in the morning and at bedtime.     lithium 300 MG tablet Take 300 mg by mouth 2 (two) times daily.     Probiotic Product (PROBIOTIC-10 PO) Take 1 capsule by mouth at bedtime.  tamoxifen (NOLVADEX) 10 MG tablet Take 1 tablet (10 mg total) by mouth daily. 90 tablet 1   thyroid (NP THYROID) 90 MG tablet Take 1 tablet (90 mg total) by mouth daily. 30 tablet 5   VITAMIN D PO Take 5,000 Units by mouth daily.     Zinc 50 MG TABS Take 50 mg by mouth daily.     No current facility-administered medications for this visit.    OBJECTIVE: White woman in no acute distress  Vitals:   05/29/22 1334  BP: 138/86  Pulse: 98  Resp: 14  Temp: (!) 97.5 F (36.4 C)  SpO2: 97%      Body mass index is 24.92 kg/m.   Wt Readings from Last 3 Encounters:  05/29/22 154 lb 6.4 oz (70 kg)  04/26/22 155 lb 6.4 oz (70.5 kg)  04/09/22 155 lb 13.8 oz (70.7 kg)      ECOG FS:1 - Symptomatic but completely ambulatory  Physical Exam Constitutional:      Appearance: Normal appearance.  Chest:     Comments: Bilateral breasts inspected.  Left breast with mild swelling and erythema.  Inferior portion of the left breast with thickened skin like orange peel appearance.  No obvious palpable masses or regional adenopathy Musculoskeletal:     Cervical back: Normal range of motion and neck supple. No rigidity.  Lymphadenopathy:     Cervical: No cervical adenopathy.  Neurological:     Mental Status: She is alert.      LAB RESULTS:  CMP     Component Value Date/Time   NA 136 04/12/2022 0417   NA 143 04/21/2020 0843   K 4.2 04/12/2022 0417   CL 107 04/12/2022 0417   CO2 24 04/12/2022 0417   GLUCOSE 107 (H) 04/12/2022  0417   BUN 14 04/12/2022 0417   BUN 13 04/21/2020 0843   CREATININE 0.90 04/12/2022 0417   CREATININE 1.16 (H) 11/28/2021 1308   CALCIUM 8.5 (L) 04/12/2022 0417   PROT 6.9 04/09/2022 1330   ALBUMIN 3.6 04/09/2022 1330   AST 17 04/09/2022 1330   AST 16 11/28/2021 1308   ALT 15 04/09/2022 1330   ALT 15 11/28/2021 1308   ALKPHOS 44 04/09/2022 1330   BILITOT 0.8 04/09/2022 1330   BILITOT 0.6 11/28/2021 1308   GFRNONAA >60 04/12/2022 0417   GFRNONAA 55 (L) 11/28/2021 1308   GFRAA >60 06/25/2014 1948    No results found for: "TOTALPROTELP", "ALBUMINELP", "A1GS", "A2GS", "BETS", "BETA2SER", "GAMS", "MSPIKE", "SPEI"  Lab Results  Component Value Date   WBC 8.1 04/12/2022   NEUTROABS 9.5 (H) 04/09/2022   HGB 11.6 (L) 04/12/2022   HCT 36.9 04/12/2022   MCV 96.9 04/12/2022   PLT 76 (L) 04/12/2022    No results found for: "LABCA2"  No components found for: "VHQION629"  No results for input(s): "INR" in the last 168 hours.  No results found for: "LABCA2"  No results found for: "BMW413"  No results found for: "CAN125"  No results found for: "CAN153"  No results found for: "CA2729"  No components found for: "HGQUANT"  No results found for: "CEA1", "CEA" / No results found for: "CEA1", "CEA"   No results found for: "AFPTUMOR"  No results found for: "CHROMOGRNA"  No results found for: "KPAFRELGTCHN", "LAMBDASER", "KAPLAMBRATIO" (kappa/lambda light chains)  No results found for: "HGBA", "HGBA2QUANT", "HGBFQUANT", "HGBSQUAN" (Hemoglobinopathy evaluation)   No results found for: "LDH"  No results found for: "IRON", "TIBC", "IRONPCTSAT" (Iron and TIBC)  No results found for: "  FERRITIN"  Urinalysis    Component Value Date/Time   COLORURINE YELLOW 06/25/2014 1850   APPEARANCEUR CLEAR 06/25/2014 1850   LABSPEC 1.029 06/25/2014 1850   PHURINE 5.5 06/25/2014 1850   GLUCOSEU NEGATIVE 06/25/2014 1850   HGBUR TRACE (A) 06/25/2014 1850   BILIRUBINUR NEGATIVE 06/25/2014  1850   KETONESUR NEGATIVE 06/25/2014 1850   PROTEINUR NEGATIVE 06/25/2014 1850   UROBILINOGEN 0.2 06/25/2014 1850   NITRITE NEGATIVE 06/25/2014 1850   LEUKOCYTESUR SMALL (A) 06/25/2014 1850    STUDIES: No results found.   ELIGIBLE FOR AVAILABLE RESEARCH PROTOCOL: no  ASSESSMENT: 57 y.o. [facility] woman status post bilateral lumpectomies 02/12/2020, showing  (a) in the right breast, an intraductal papilloma and atypical lobular hyperplasia  (b) in the left breast, a pT1a pNX, stage IA invasive ductal carcinoma, grade 2, with positive margins   (i) the invasive disease was estrogen and progesterone receptor positive, HER-2 not amplified, with an MIB-1 of 2%   (ii) additional surgery 03/04/2020 clear margins   (iii) left axillary sentinel lymph node sampling 03/04/2020 removed 2 lymph nodes both negative  (1) adjuvant radiation completed 04/30/2020  (2) genetics testing 04/02/2020 through the [facility]Expanded +RNAinsight Panel found no deleterious mutations in AIP, ALK, APC, ATM, AXIN2, BAP1, BARD1, BLM, BMPR1A, BRCA1, BRCA2, BRIP1, CDC73, CDH1, CDK4, CDKN1B, CDKN2A, CHEK2, CTNNA1, DICER1, FANCC, FH, FLCN, GALNT12, KIF1B, LZTR1, MAX, MEN1, MET, MLH1, MSH2, MSH3, MSH6, MUTYH, NBN, NF1, NF2, NTHL1, PALB2, PHOX2B, PMS2, POT1, PRKAR1A, PTCH1, PTEN, RAD51C, RAD51D, RB1, RECQL, RET, SDHA, SDHAF2, SDHB, SDHC, SDHD, SMAD4, SMARCA4, SMARCB1, SMARCE1, STK11, SUFU, TMEM127, TP53, TSC1, TSC2, VHL and XRCC2 (sequencing and deletion/duplication); EGFR, EGLN1, HOXB13, KIT, MITF, PDGFRA, POLD1, and POLE (sequencing only); EPCAM and GREM1 (deletion/duplication only).   (3) declined antiestrogen therapy at this time  PLAN:  Patient is now on low-dose tamoxifen, tolerating this remarkably well.  She continues on intensified screening with MRIs and mammograms.   She has a MRI scheduled coming Saturday. We will review MRI and see if there is role for punch biopsy I once again reassured her that the  presentation is less likely concerning for inflammatory breast cancer.  She can certainly do a punch biopsy when she follows up with Dr. Carolynne Edouard if there is high clinical concern or sooner.  She understands that sometimes punch biopsy can be false negative so we may still have to clinically monitor it.  I have encouraged her to continue tamoxifen in the interim and report any changes to me.  She expressed understanding of all the recommendations. We will repeat CBC today since she had some thrombocytopenia in the hospital from sepsis.  RTC in person in 6 months.  Total time spent: 30 minutes. *Total Encounter Time as defined by the Centers for Medicare and Medicaid Services includes, in addition to the face-to-face time of a patient visit (documented in the note above) non-face-to-face time: obtaining and reviewing outside history, ordering and reviewing medications, tests or procedures, care coordination (communications with other health care professionals or caregivers) and documentation in the medical record.

## 2022-05-30 ENCOUNTER — Telehealth: Payer: Self-pay | Admitting: Hematology and Oncology

## 2022-05-30 NOTE — Telephone Encounter (Signed)
Left patient a vm regarding upcoming appointment  

## 2022-05-31 ENCOUNTER — Other Ambulatory Visit (HOSPITAL_BASED_OUTPATIENT_CLINIC_OR_DEPARTMENT_OTHER): Payer: Self-pay

## 2022-05-31 DIAGNOSIS — F3111 Bipolar disorder, current episode manic without psychotic features, mild: Secondary | ICD-10-CM | POA: Diagnosis not present

## 2022-05-31 DIAGNOSIS — H43392 Other vitreous opacities, left eye: Secondary | ICD-10-CM | POA: Diagnosis not present

## 2022-05-31 MED ORDER — LITHIUM CARBONATE ER 300 MG PO TBCR
300.0000 mg | EXTENDED_RELEASE_TABLET | Freq: Two times a day (BID) | ORAL | 5 refills | Status: AC
  Filled 2022-05-31: qty 60, 30d supply, fill #0
  Filled 2022-07-14: qty 60, 30d supply, fill #1
  Filled 2022-08-12: qty 60, 30d supply, fill #2
  Filled 2022-09-10: qty 60, 30d supply, fill #3
  Filled 2022-10-12: qty 60, 30d supply, fill #4
  Filled 2022-11-08: qty 60, 30d supply, fill #5

## 2022-06-03 ENCOUNTER — Ambulatory Visit
Admission: RE | Admit: 2022-06-03 | Discharge: 2022-06-03 | Disposition: A | Discharge status: Home / Self Care | Payer: BC Managed Care – PPO | Source: Ambulatory Visit | Attending: Hematology and Oncology | Admitting: Hematology and Oncology

## 2022-06-03 DIAGNOSIS — Z853 Personal history of malignant neoplasm of breast: Secondary | ICD-10-CM | POA: Diagnosis not present

## 2022-06-03 DIAGNOSIS — L03114 Cellulitis of left upper limb: Secondary | ICD-10-CM | POA: Diagnosis not present

## 2022-06-03 DIAGNOSIS — C50812 Malignant neoplasm of overlapping sites of left female breast: Secondary | ICD-10-CM

## 2022-06-03 MED ORDER — GADOPICLENOL 0.5 MMOL/ML IV SOLN
7.0000 mL | Freq: Once | INTRAVENOUS | Status: AC | PRN
  Administered 2022-06-03: 7 mL via INTRAVENOUS

## 2022-06-05 ENCOUNTER — Other Ambulatory Visit (HOSPITAL_BASED_OUTPATIENT_CLINIC_OR_DEPARTMENT_OTHER): Payer: Self-pay

## 2022-06-07 ENCOUNTER — Other Ambulatory Visit: Payer: Self-pay | Admitting: Hematology and Oncology

## 2022-06-09 ENCOUNTER — Inpatient Hospital Stay: Payer: BC Managed Care – PPO | Admitting: Hematology and Oncology

## 2022-06-21 DIAGNOSIS — Z170 Estrogen receptor positive status [ER+]: Secondary | ICD-10-CM | POA: Diagnosis not present

## 2022-06-21 DIAGNOSIS — C50412 Malignant neoplasm of upper-outer quadrant of left female breast: Secondary | ICD-10-CM | POA: Diagnosis not present

## 2022-07-14 ENCOUNTER — Other Ambulatory Visit (HOSPITAL_BASED_OUTPATIENT_CLINIC_OR_DEPARTMENT_OTHER): Payer: Self-pay

## 2022-07-14 DIAGNOSIS — E7212 Methylenetetrahydrofolate reductase deficiency: Secondary | ICD-10-CM | POA: Diagnosis not present

## 2022-07-14 DIAGNOSIS — B370 Candidal stomatitis: Secondary | ICD-10-CM | POA: Diagnosis not present

## 2022-07-14 DIAGNOSIS — D8989 Other specified disorders involving the immune mechanism, not elsewhere classified: Secondary | ICD-10-CM | POA: Diagnosis not present

## 2022-07-14 DIAGNOSIS — B360 Pityriasis versicolor: Secondary | ICD-10-CM | POA: Diagnosis not present

## 2022-07-14 DIAGNOSIS — D696 Thrombocytopenia, unspecified: Secondary | ICD-10-CM | POA: Diagnosis not present

## 2022-07-14 DIAGNOSIS — E063 Autoimmune thyroiditis: Secondary | ICD-10-CM | POA: Diagnosis not present

## 2022-07-14 MED ORDER — THYROID 90 MG PO TABS
90.0000 mg | ORAL_TABLET | Freq: Every morning | ORAL | 3 refills | Status: DC
  Filled 2022-07-14: qty 90, 90d supply, fill #0
  Filled 2022-07-14: qty 30, 30d supply, fill #0
  Filled 2022-08-12: qty 30, 30d supply, fill #1
  Filled 2022-09-10: qty 30, 30d supply, fill #2
  Filled 2022-10-12: qty 30, 30d supply, fill #3
  Filled 2022-11-08: qty 30, 30d supply, fill #4
  Filled 2022-12-08: qty 30, 30d supply, fill #5

## 2022-08-31 ENCOUNTER — Encounter (INDEPENDENT_AMBULATORY_CARE_PROVIDER_SITE_OTHER): Payer: Self-pay

## 2022-09-11 ENCOUNTER — Other Ambulatory Visit (HOSPITAL_BASED_OUTPATIENT_CLINIC_OR_DEPARTMENT_OTHER): Payer: Self-pay

## 2022-09-20 DIAGNOSIS — R87610 Atypical squamous cells of undetermined significance on cytologic smear of cervix (ASC-US): Secondary | ICD-10-CM | POA: Diagnosis not present

## 2022-09-20 DIAGNOSIS — Z01419 Encounter for gynecological examination (general) (routine) without abnormal findings: Secondary | ICD-10-CM | POA: Diagnosis not present

## 2022-09-21 LAB — RESULTS CONSOLE HPV: CHL HPV: NEGATIVE

## 2022-09-21 LAB — HM PAP SMEAR: HPV, high-risk: NEGATIVE

## 2022-09-26 ENCOUNTER — Ambulatory Visit: Payer: BC Managed Care – PPO | Attending: General Surgery

## 2022-09-26 VITALS — Wt 155.5 lb

## 2022-09-26 DIAGNOSIS — C50412 Malignant neoplasm of upper-outer quadrant of left female breast: Secondary | ICD-10-CM | POA: Diagnosis not present

## 2022-09-26 DIAGNOSIS — Z170 Estrogen receptor positive status [ER+]: Secondary | ICD-10-CM | POA: Diagnosis not present

## 2022-09-26 DIAGNOSIS — Z483 Aftercare following surgery for neoplasm: Secondary | ICD-10-CM | POA: Insufficient documentation

## 2022-09-26 NOTE — Therapy (Signed)
OUTPATIENT PHYSICAL THERAPY SOZO SCREENING NOTE   Patient Name: Cheyenne Garner MRN: [id number] DOB:16-Apr-1965, 57 y.o., female Today's Date: 09/26/2022  PCP: Bradd Canary, MD REFERRING PROVIDER: Bradd Canary, MD   PT End of Session - 09/26/22 1011     Visit Number 14   # unchanged due ot screen only   PT Start Time 1007    PT Stop Time 1012    PT Time Calculation (min) 5 min    Activity Tolerance Patient tolerated treatment well    Behavior During Therapy WFL for tasks assessed/performed             Past Medical History:  Diagnosis Date   Allergies    Anxiety    Asthma    cough variant asthma developed in last couple of years   Bipolar 1 disorder (HCC)    Breast cancer (HCC) 02/12/2020   left lumpectomy IDC w/radiation no chemo   Cervical cancer screening 09/12/2016   Colon polyp 09/12/2016   Family history of bladder cancer    Family history of breast cancer    Family history of ovarian cancer    Family history of prostate cancer    Hx of colonic polyp 09/12/2016   Colonoscopy 2017 with 1 small polyp, done by Dr Dulce Sellar, repeat colonoscopy in 5 years, 2022   Hypothyroidism    Personal history of radiation therapy    Preventative health care 07/23/2015   Thyroid disease    Past Surgical History:  Procedure Laterality Date   APPENDECTOMY     BREAST LUMPECTOMY WITH RADIOACTIVE SEED LOCALIZATION Bilateral 02/12/2020   Procedure: BILATERAL BREAST LUMPECTOMY WITH RADIOACTIVE SEED LOCALIZATION;  Surgeon: Griselda Miner, MD;  Location: [facility];  Service: General;  Laterality: Bilateral;   RE-EXCISION OF BREAST LUMPECTOMY Left 03/04/2020   Procedure: LEFT BREAST MARGIN REEXCISION;  Surgeon: Griselda Miner, MD;  Location: [facility];  Service: General;  Laterality: Left;   SENTINEL NODE BIOPSY Left 03/04/2020   Procedure: SENTINEL NODE BIOPSY;  Surgeon: Griselda Miner, MD;  Location:  [facility];  Service: General;   Laterality: Left;   SKIN BIOPSY     back moles removed (precancer?)   Patient Active Problem List   Diagnosis Date Noted   Cellulitis of breast 04/09/2022   Chest pain 04/09/2022   History of breast cancer 04/09/2022   Thrombocytopenia (HCC) 04/09/2022   History of COVID-19 03/28/2022   Hyperglycemia 11/08/2020   Elevated sed rate 11/08/2020   [place] 07/14/2020   Malignant neoplasm of overlapping sites of left breast in female, estrogen receptor positive (HCC) 05/18/2020   Genetic testing 04/07/2020   Hypothyroidism    Anxiety    Allergies    FH: thoracic aortic aneurysm 03/23/2020   Family history of breast cancer    Family history of ovarian cancer    Family history of bladder cancer    Family history of prostate cancer    Breast cancer in female (HCC) 03/09/2020   Bilateral dry eyes 01/27/2019   Cervical cancer screening 09/12/2016   Hx of colonic polyp 09/12/2016   Colon polyp 09/12/2016   Preventative health care 07/23/2015   Asthma, chronic    Thyroid disease    Bipolar affective (HCC)    Urinary incontinence 12/25/2011    REFERRING DIAG: left breast cancer at risk for lymphedema  THERAPY DIAG: Aftercare following surgery for neoplasm  PERTINENT HISTORY: L breast cancer, s/p bilateral breast lumpectomies 02/12/20, R was  benign and L was discovered to have DCIS and IDC ER/PR+, HER 2-, 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   PAIN:  Are you having pain? No  SOZO SCREENING: Patient was assessed today using the SOZO machine to determine the lymphedema index score. This was compared to her baseline score. It was determined that she is within the recommended range when compared to her baseline and no further action is needed at this time. She will continue SOZO screenings. These are done every 3  months for 2 years post operatively followed by every 6 months for 2 years, and then annually.  Pt will begin 6 month screens now.    L-DEX FLOWSHEETS - 09/26/22 1000       L-DEX LYMPHEDEMA SCREENING   Measurement Type Unilateral    L-DEX MEASUREMENT EXTREMITY Upper Extremity    POSITION  Standing    DOMINANT SIDE Left    At Risk Side Left    BASELINE SCORE (UNILATERAL) -1.7    L-DEX SCORE (UNILATERAL) -2.9    VALUE CHANGE (UNILAT) -1.2              Hermenia Bers, PTA 09/26/2022, 10:12 AM

## 2022-09-28 ENCOUNTER — Ambulatory Visit: Payer: BC Managed Care – PPO | Admitting: Family Medicine

## 2022-09-28 VITALS — BP 110/64 | HR 64 | Temp 98.0°F | Resp 16 | Ht 66.0 in | Wt 156.0 lb

## 2022-09-28 DIAGNOSIS — F319 Bipolar disorder, unspecified: Secondary | ICD-10-CM

## 2022-09-28 DIAGNOSIS — R700 Elevated erythrocyte sedimentation rate: Secondary | ICD-10-CM

## 2022-09-28 DIAGNOSIS — C50912 Malignant neoplasm of unspecified site of left female breast: Secondary | ICD-10-CM | POA: Diagnosis not present

## 2022-09-28 DIAGNOSIS — C50911 Malignant neoplasm of unspecified site of right female breast: Secondary | ICD-10-CM | POA: Diagnosis not present

## 2022-09-28 DIAGNOSIS — R739 Hyperglycemia, unspecified: Secondary | ICD-10-CM

## 2022-09-28 DIAGNOSIS — D696 Thrombocytopenia, unspecified: Secondary | ICD-10-CM | POA: Diagnosis not present

## 2022-09-28 DIAGNOSIS — E079 Disorder of thyroid, unspecified: Secondary | ICD-10-CM

## 2022-09-28 DIAGNOSIS — E2839 Other primary ovarian failure: Secondary | ICD-10-CM

## 2022-09-28 LAB — COMPREHENSIVE METABOLIC PANEL
ALT: 12 U/L (ref 0–35)
AST: 15 U/L (ref 0–37)
Albumin: 4.2 g/dL (ref 3.5–5.2)
Alkaline Phosphatase: 64 U/L (ref 39–117)
BUN: 17 mg/dL (ref 6–23)
CO2: 29 meq/L (ref 19–32)
Calcium: 9.6 mg/dL (ref 8.4–10.5)
Chloride: 102 meq/L (ref 96–112)
Creatinine, Ser: 0.93 mg/dL (ref 0.40–1.20)
GFR: 68.18 mL/min (ref >60.00)
Glucose, Bld: 88 mg/dL (ref 70–99)
Potassium: 4.6 meq/L (ref 3.5–5.1)
Sodium: 140 meq/L (ref 135–145)
Total Bilirubin: 0.7 mg/dL (ref 0.2–1.2)
Total Protein: 7.1 g/dL (ref 6.0–8.3)

## 2022-09-28 LAB — CBC WITH DIFFERENTIAL/PLATELET
Basophils Absolute: 0 10*3/uL (ref 0.0–0.1)
Basophils Relative: 1 % (ref 0.0–3.0)
Eosinophils Absolute: 0.1 10*3/uL (ref 0.0–0.7)
Eosinophils Relative: 2.9 % (ref 0.0–5.0)
HCT: 44.5 % (ref 36.0–46.0)
Hemoglobin: 14.5 g/dL (ref 12.0–15.0)
Lymphocytes Relative: 29.2 % (ref 12.0–46.0)
Lymphs Abs: 1.4 10*3/uL (ref 0.7–4.0)
MCHC: 32.6 g/dL (ref 30.0–36.0)
MCV: 92.3 fl (ref 78.0–100.0)
Monocytes Absolute: 0.4 10*3/uL (ref 0.1–1.0)
Monocytes Relative: 8.1 % (ref 3.0–12.0)
Neutro Abs: 2.8 10*3/uL (ref 1.4–7.7)
Neutrophils Relative %: 58.8 % (ref 43.0–77.0)
Platelets: 166 10*3/uL (ref 150.0–400.0)
RBC: 4.82 Mil/uL (ref 3.87–5.11)
RDW: 12.4 % (ref 11.5–15.5)
WBC: 4.8 10*3/uL (ref 4.0–10.5)

## 2022-09-28 LAB — LIPID PANEL
Cholesterol: 179 mg/dL (ref 0–200)
HDL: 76.8 mg/dL (ref >39.00)
LDL Cholesterol: 82 mg/dL (ref 0–99)
NonHDL: 102.6
Total CHOL/HDL Ratio: 2
Triglycerides: 103 mg/dL (ref 0.0–149.0)
VLDL: 20.6 mg/dL (ref 0.0–40.0)

## 2022-09-28 LAB — TSH: TSH: 2.99 u[IU]/mL (ref 0.35–5.50)

## 2022-09-28 LAB — HEMOGLOBIN A1C: Hgb A1c MFr Bld: 5.3 % (ref 4.6–6.5)

## 2022-09-28 LAB — HM PAP SMEAR: HM Pap smear: NEGATIVE

## 2022-09-28 LAB — SEDIMENTATION RATE: Sed Rate: 5 mm/h (ref 0–30)

## 2022-09-28 NOTE — Patient Instructions (Signed)

## 2022-09-28 NOTE — Assessment & Plan Note (Signed)
hgba1c acceptable, minimize simple carbs. Increase exercise as tolerated.  

## 2022-09-28 NOTE — Assessment & Plan Note (Signed)
monitor

## 2022-09-28 NOTE — Assessment & Plan Note (Addendum)
Is being followed by endorcrinology

## 2022-09-28 NOTE — Assessment & Plan Note (Signed)
Stable on Lithium no changes

## 2022-09-28 NOTE — Assessment & Plan Note (Signed)
Maintained on Tamoxifen

## 2022-09-28 NOTE — Assessment & Plan Note (Signed)
Asymptomatic - continue to monitor

## 2022-09-29 LAB — LITHIUM LEVEL: Lithium Lvl: 0.5 mmol/L — ABNORMAL LOW (ref 0.6–1.2)

## 2022-09-29 NOTE — Progress Notes (Signed)
Subjective:    Patient ID: Cheyenne Garner, female    DOB: May 09, 1965, 57 y.o.   MRN: [id number]  Chief Complaint  Patient presents with   Follow-up    Follow up    HPI Discussed the use of AI scribe software for clinical note transcription with the patient, who gave verbal consent to proceed.  History of Present Illness   The patient, with a history of breast cancer treated with radiation, presents with discomfort when taking deep breaths. She describes the sensation as a pain deep in the breast tissue, possibly related to scar tissue from radiation treatment. She also mentions a history of cellulitis, which was extremely painful and required hospitalization. The patient also reports a history of thyroid issues and is currently on Tamoxifen for breast cancer treatment. She has concerns about lung nodules that were identified on a previous CT scan but have not changed in size according to a recent scan. The patient is also concerned about the potential risks associated with Tamoxifen and the possibility of radiation recall.        Past Medical History:  Diagnosis Date   Allergies    Anxiety    Asthma    cough variant asthma developed in last couple of years   Bipolar 1 disorder (HCC)    Breast cancer (HCC) 02/12/2020   left lumpectomy IDC w/radiation no chemo   Cervical cancer screening 09/12/2016   Colon polyp 09/12/2016   Family history of bladder cancer    Family history of breast cancer    Family history of ovarian cancer    Family history of prostate cancer    Hx of colonic polyp 09/12/2016   Colonoscopy 2017 with 1 small polyp, done by Dr Dulce Sellar, repeat colonoscopy in 5 years, 2022   Hypothyroidism    Personal history of radiation therapy    Preventative health care 07/23/2015   Thyroid disease     Past Surgical History:  Procedure Laterality Date   APPENDECTOMY     BREAST LUMPECTOMY WITH RADIOACTIVE SEED LOCALIZATION Bilateral 02/12/2020   Procedure: BILATERAL  BREAST LUMPECTOMY WITH RADIOACTIVE SEED LOCALIZATION;  Surgeon: Griselda Miner, MD;  Location: [facility];  Service: General;  Laterality: Bilateral;   RE-EXCISION OF BREAST LUMPECTOMY Left 03/04/2020   Procedure: LEFT BREAST MARGIN REEXCISION;  Surgeon: Griselda Miner, MD;  Location: [facility];  Service: General;  Laterality: Left;   SENTINEL NODE BIOPSY Left 03/04/2020   Procedure: SENTINEL NODE BIOPSY;  Surgeon: Griselda Miner, MD;  Location: [facility];  Service: General;  Laterality: Left;   SKIN BIOPSY     back moles removed (precancer?)    Family History  Problem Relation Age of Onset   Arthritis Mother    Dementia Mother        alzheimers   Cancer Father        bladder   Hypertension Father    Heart disease Father        MI, s/p triple bypass at age 21   Neuropathy Father        toxic peripheral    Other Father        peripheral neuropathy   Cholelithiasis Sister    Aortic aneurysm Sister 52   Aneurysm Sister        descending aortic    Mental retardation Sister        ADD, schizoaffective d/o   Obesity Sister    Depression Brother  Arthritis Brother        back disease   Prostate cancer Paternal Uncle        spread to bladder, d. 60s/70s   Dementia Maternal Grandmother    Osteoarthritis Maternal Grandmother    Macular degeneration Maternal Grandfather    Osteoarthritis Maternal Grandfather    Congestive Heart Failure Paternal Grandmother    Cancer Paternal Grandfather        young, lung cancer?, heavy smoker   Ovarian cancer Cousin 16       maternal second cousin (MGF's great-niece)   Breast cancer Cousin 18       paternal first cousin   Cancer Niece 11       soft tissue sarcoma    Social History   Socioeconomic History   Marital status: Married    Spouse name: Not on file   Number of children: Not on file   Years of education: Not on file   Highest education level: Not on file  Occupational History    Not on file  Tobacco Use   Smoking status: Never   Smokeless tobacco: Never  Substance and Sexual Activity   Alcohol use: Yes    Comment: social   Drug use: No   Sexual activity: Yes    Birth control/protection: None, Post-menopausal  Other Topics Concern   Not on file  Social History Narrative   Lives with husband, 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 No major dietary restrictions   Social Determinants of Health   Financial Resource Strain: Not on file  Food Insecurity: No Food Insecurity (04/17/2022)   Hunger Vital Sign    Worried About Running Out of Food in the Last Year: Never true    Ran Out of Food in the Last Year: Never true  Transportation Needs: No Transportation Needs (04/17/2022)   PRAPARE - Administrator, Civil Service (Medical): No    Lack of Transportation (Non-Medical): No  Physical Activity: Not on file  Stress: Not on file  Social Connections: Not on file  Intimate Partner Violence: Not At Risk (04/09/2022)   Humiliation, Afraid, Rape, and Kick questionnaire    Fear of Current or Ex-Partner: No    Emotionally Abused: No    Physically Abused: No    Sexually Abused: No    Outpatient Medications Prior to Visit  Medication Sig Dispense Refill   albuterol (VENTOLIN HFA) 108 (90 Base) MCG/ACT inhaler Inhale 2 puffs into the lungs every 6 (six) hours as needed for wheezing or shortness of breath. 6.7 g 2   ASHWAGANDHA PO Take 2 tablets by mouth as needed (anxiety).     CALCIUM PO Take 2 tablets by mouth in the morning, at noon, and at bedtime. Bone Up     Carboxymethylcellulose Sodium (EYE DROPS OP) Place 1 drop into both eyes in the morning and at bedtime. Systemic Balance     Cyanocobalamin (VITAMIN B-12 PO) Take 1 capsule by mouth in the morning and at bedtime.     lithium carbonate (LITHOBID) 300 MG ER tablet Take 1 tablet (300 mg total) by mouth 2 (two) times daily. 60 tablet 5   Probiotic Product (PROBIOTIC-10 PO) Take 1 capsule by  mouth at bedtime.     tamoxifen (NOLVADEX) 10 MG tablet Take 1 tablet (10 mg total) by mouth daily. 90 tablet 1   thyroid (NP THYROID) 90 MG tablet Take 1 tablet (90 mg total) by mouth in the morning. 90 tablet  3   VITAMIN D PO Take 5,000 Units by mouth daily.     Zinc 50 MG TABS Take 50 mg by mouth daily.     lithium 300 MG tablet Take 300 mg by mouth 2 (two) times daily.     No facility-administered medications prior to visit.    Allergies  Allergen Reactions   Gentamycin [Gentamicin] Other (See Comments)    Reacted to eye ointment, swelling red painful eyes    Review of Systems  Constitutional:  Negative for fever and malaise/fatigue.  HENT:  Negative for congestion.   Eyes:  Negative for blurred vision.  Respiratory:  Negative for shortness of breath.   Cardiovascular:  Negative for chest pain, palpitations and leg swelling.  Gastrointestinal:  Negative for abdominal pain, blood in stool and nausea.  Genitourinary:  Negative for dysuria and frequency.  Musculoskeletal:  Negative for falls.  Skin:  Negative for rash.  Neurological:  Negative for dizziness, loss of consciousness and headaches.  Endo/Heme/Allergies:  Negative for environmental allergies.  Psychiatric/Behavioral:  Negative for depression. The patient is not nervous/anxious.        Objective:    Physical Exam Constitutional:      General: She is not in acute distress.    Appearance: Normal appearance. She is well-developed. She is not toxic-appearing.  HENT:     Head: Normocephalic and atraumatic.     Right Ear: External ear normal.     Left Ear: External ear normal.     Nose: Nose normal.  Eyes:     General:        Right eye: No discharge.        Left eye: No discharge.     Conjunctiva/sclera: Conjunctivae normal.  Neck:     Thyroid: No thyromegaly.  Cardiovascular:     Rate and Rhythm: Normal rate and regular rhythm.     Heart sounds: Normal heart sounds. No murmur heard. Pulmonary:     Effort:  Pulmonary effort is normal. No respiratory distress.     Breath sounds: Normal breath sounds.  Abdominal:     General: Bowel sounds are normal.     Palpations: Abdomen is soft.     Tenderness: There is no abdominal tenderness. There is no guarding.  Musculoskeletal:        General: Normal range of motion.     Cervical back: Neck supple.  Lymphadenopathy:     Cervical: No cervical adenopathy.  Skin:    General: Skin is warm and dry.  Neurological:     Mental Status: She is alert and oriented to person, place, and time.  Psychiatric:        Mood and Affect: Mood normal.        Behavior: Behavior normal.        Thought Content: Thought content normal.        Judgment: Judgment normal.     BP 110/64 (BP Location: Left Arm, Patient Position: Sitting, Cuff Size: Normal)   Pulse 64   Temp 98 F (36.7 C) (Oral)   Resp 16   Ht 5\' 6"  (1.676 m)   Wt 156 lb (70.8 kg)   SpO2 99%   BMI 25.18 kg/m  Wt Readings from Last 3 Encounters:  09/28/22 156 lb (70.8 kg)  09/26/22 155 lb 8 oz (70.5 kg)  05/29/22 154 lb 6.4 oz (70 kg)    Diabetic Foot Exam - Simple   No data filed    Lab Results  Component Value  Date   WBC 4.8 09/28/2022   HGB 14.5 09/28/2022   HCT 44.5 09/28/2022   PLT 166.0 09/28/2022   GLUCOSE 88 09/28/2022   CHOL 179 09/28/2022   TRIG 103.0 09/28/2022   HDL 76.80 09/28/2022   LDLCALC 82 09/28/2022   ALT 12 09/28/2022   AST 15 09/28/2022   NA 140 09/28/2022   K 4.6 09/28/2022   CL 102 09/28/2022   CREATININE 0.93 09/28/2022   BUN 17 09/28/2022   CO2 29 09/28/2022   TSH 2.99 09/28/2022   HGBA1C 5.3 09/28/2022    Lab Results  Component Value Date   TSH 2.99 09/28/2022   Lab Results  Component Value Date   WBC 4.8 09/28/2022   HGB 14.5 09/28/2022   HCT 44.5 09/28/2022   MCV 92.3 09/28/2022   PLT 166.0 09/28/2022   Lab Results  Component Value Date   NA 140 09/28/2022   K 4.6 09/28/2022   CO2 29 09/28/2022   GLUCOSE 88 09/28/2022   BUN 17  09/28/2022   CREATININE 0.93 09/28/2022   BILITOT 0.7 09/28/2022   ALKPHOS 64 09/28/2022   AST 15 09/28/2022   ALT 12 09/28/2022   PROT 7.1 09/28/2022   ALBUMIN 4.2 09/28/2022   CALCIUM 9.6 09/28/2022   ANIONGAP 5 05/29/2022   EGFR 73 04/21/2020   GFR 68.18 09/28/2022   Lab Results  Component Value Date   CHOL 179 09/28/2022   Lab Results  Component Value Date   HDL 76.80 09/28/2022   Lab Results  Component Value Date   LDLCALC 82 09/28/2022   Lab Results  Component Value Date   TRIG 103.0 09/28/2022   Lab Results  Component Value Date   CHOLHDL 2 09/28/2022   Lab Results  Component Value Date   HGBA1C 5.3 09/28/2022       Assessment & Plan:  Bilateral malignant neoplasm of breast in female, unspecified estrogen receptor status, unspecified site of breast (HCC) Assessment & Plan: Maintained on Tamoxifen  Orders: -     DG Bone Density; Future  Elevated sed rate Assessment & Plan: monitor  Orders: -     Sedimentation rate  Thrombocytopenia (HCC) Assessment & Plan: Asymptomatic continue to monitor   Thyroid disease Assessment & Plan: Is being followed by endorcrinology   Orders: -     CBC with Differential/Platelet -     TSH  Hyperglycemia Assessment & Plan: hgba1c acceptable, minimize simple carbs. Increase exercise as tolerated.    Orders: -     Comprehensive metabolic panel -     Lipid panel -     Hemoglobin A1c  Bipolar affective disorder, remission status unspecified (HCC) Assessment & Plan: Stable on Lithium no changes  Orders: -     Lithium level  Estrogen deficiency -     DG Bone Density; Future    Assessment and Plan    Breast Cancer Patient is on Tamoxifen 10mg  with no reported side effects. She is currently being followed up regularly. She has a history of radiation treatment with possible radiation recall causing cellulitis and deep breast tissue pain. -Continue Tamoxifen 10mg . -Consider pulmonary consult for  sophisticated PFTs to rule out lung involvement.  Cellulitis Patient had a severe episode of cellulitis, possibly due to radiation recall, which required hospitalization and IV antibiotics. She reports it is now healed. -Monitor for recurrence of cellulitis.  Lung Nodules Patient has known lung nodules which were stable on last CT scan. She has concerns about radiation exposure from  repeated CT scans. -Review previous CT scans to assess for any changes in lung nodules. -Consider pulmonary consult if there are notable changes.  Bone Density Patient is off estrogen and progesterone and it has been two years since her last bone scan. -Order bone scan to assess bone density.  General Health Maintenance -Order blood work including sugar and thyroid levels. -Consider adding lithium levels to blood work as patient is on treatment with a psychiatrist. -Follow-up in six months.         Danise Edge, MD

## 2022-11-15 ENCOUNTER — Other Ambulatory Visit: Payer: Self-pay | Admitting: Hematology and Oncology

## 2022-11-15 DIAGNOSIS — C50011 Malignant neoplasm of nipple and areola, right female breast: Secondary | ICD-10-CM

## 2022-11-15 NOTE — Progress Notes (Signed)
Mammogram ordered.  Cheyenne Garner

## 2022-11-18 ENCOUNTER — Ambulatory Visit
Admission: RE | Admit: 2022-11-18 | Discharge: 2022-11-18 | Disposition: A | Discharge status: Home / Self Care | Payer: BC Managed Care – PPO | Source: Ambulatory Visit | Attending: Hematology and Oncology | Admitting: Hematology and Oncology

## 2022-11-18 DIAGNOSIS — C50011 Malignant neoplasm of nipple and areola, right female breast: Secondary | ICD-10-CM

## 2022-11-18 DIAGNOSIS — N644 Mastodynia: Secondary | ICD-10-CM | POA: Diagnosis not present

## 2022-11-18 DIAGNOSIS — Z853 Personal history of malignant neoplasm of breast: Secondary | ICD-10-CM | POA: Diagnosis not present

## 2022-11-21 ENCOUNTER — Other Ambulatory Visit (HOSPITAL_BASED_OUTPATIENT_CLINIC_OR_DEPARTMENT_OTHER): Payer: BC Managed Care – PPO

## 2022-11-22 ENCOUNTER — Other Ambulatory Visit (HOSPITAL_BASED_OUTPATIENT_CLINIC_OR_DEPARTMENT_OTHER): Payer: Self-pay

## 2022-11-22 DIAGNOSIS — F3111 Bipolar disorder, current episode manic without psychotic features, mild: Secondary | ICD-10-CM | POA: Diagnosis not present

## 2022-11-22 MED ORDER — LITHIUM CARBONATE ER 300 MG PO TBCR
300.0000 mg | EXTENDED_RELEASE_TABLET | Freq: Two times a day (BID) | ORAL | 5 refills | Status: DC
  Filled 2022-12-08: qty 60, 30d supply, fill #0
  Filled 2023-01-22: qty 60, 30d supply, fill #1
  Filled 2023-02-21 (×2): qty 60, 30d supply, fill #2

## 2022-11-25 IMAGING — MG MM PLC BREAST LOC DEV 1ST LESION INC MAMMO GUIDE*L*
6 series · 6 of 6 positions shown · non-contrast
Comparison: Previous exam(s).

CLINICAL DATA: 54-year-old with biopsy-proven high risk atypical
lobular hyperplasia involving the LOWER INNER QUADRANT of the RIGHT
breast and a discordant benign biopsy of a mass in the UPPER OUTER
QUADRANT of the LEFT breast.

EXAM:
MAMMOGRAPHIC GUIDED RADIOACTIVE SEED LOCALIZATION OF THE RIGHT
BREAST
MAMMOGRAPHIC GUIDED RADIOACTIVE SEED LOCALIZATION OF THE LEFT BREAST

[L CC (1 of 3)]
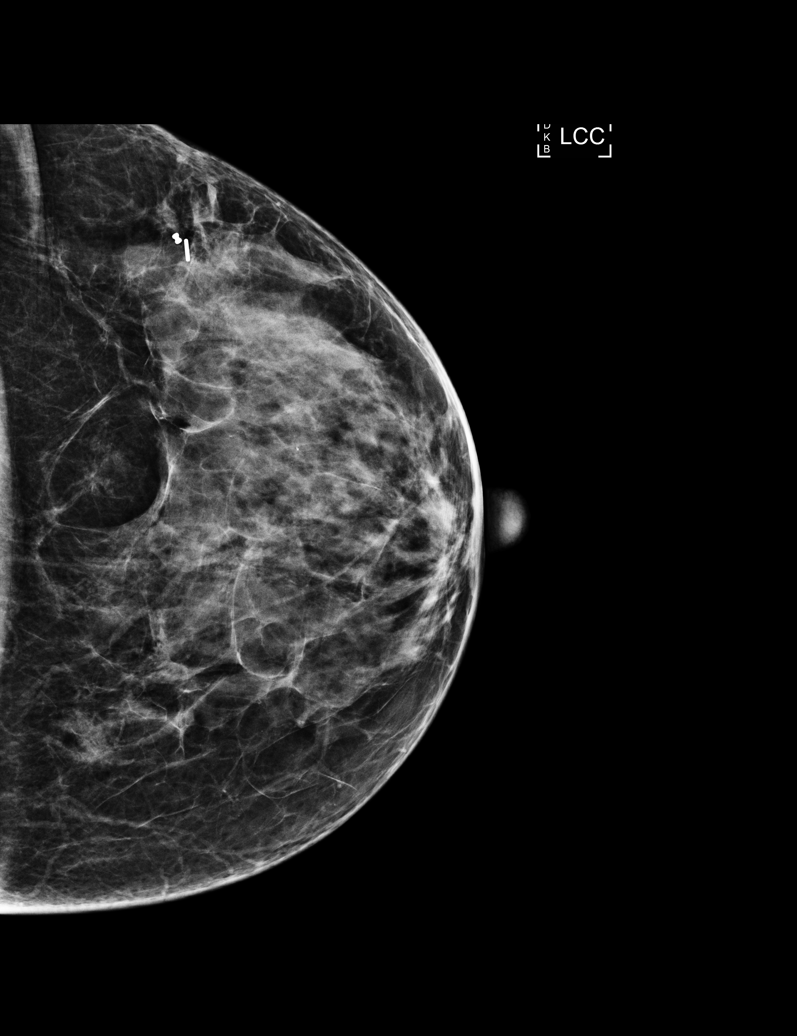

[L LM (1 of 2)]
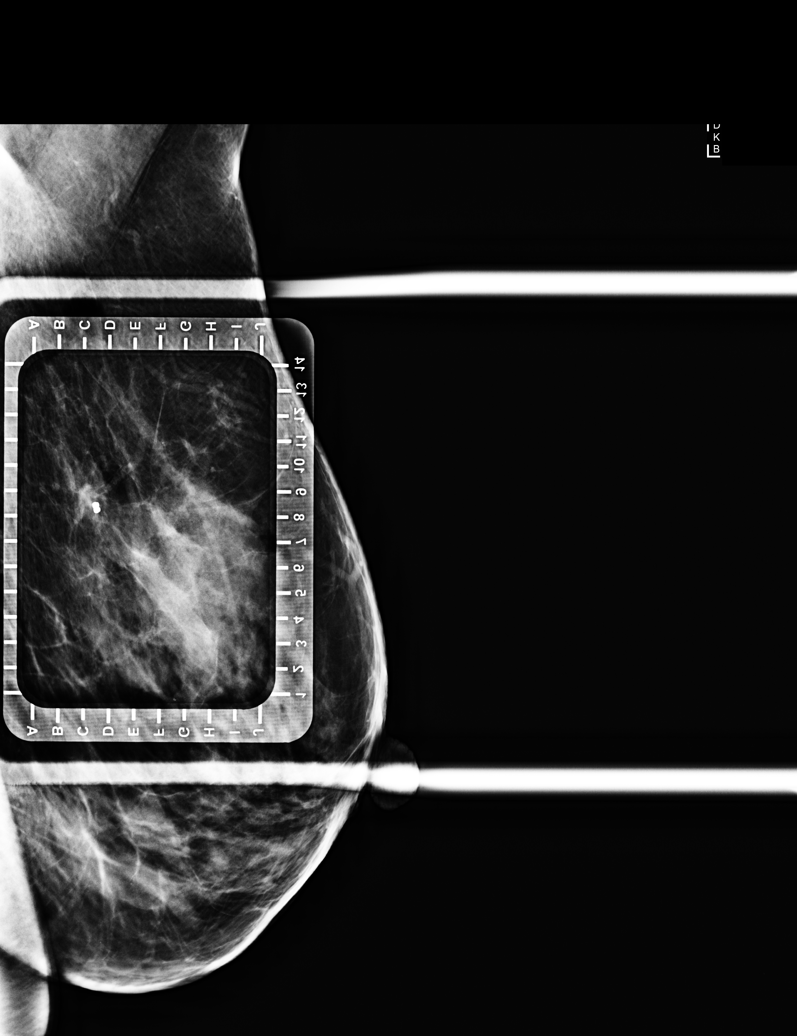

[L LM (2 of 2)]
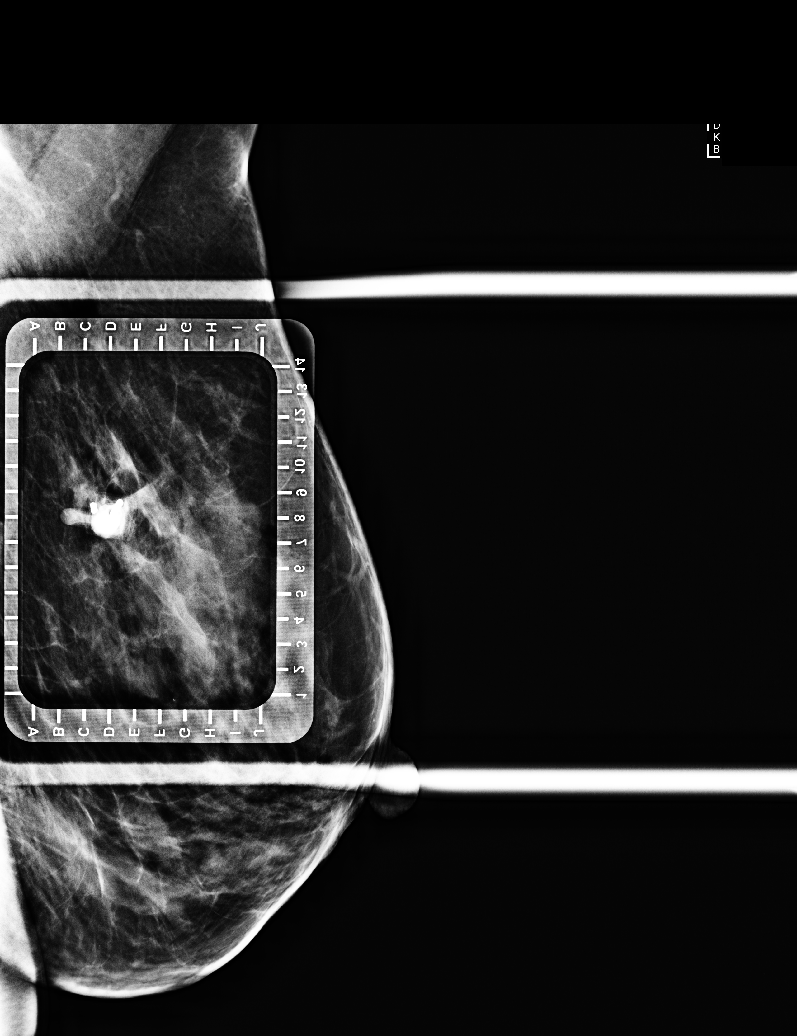

[L ML]
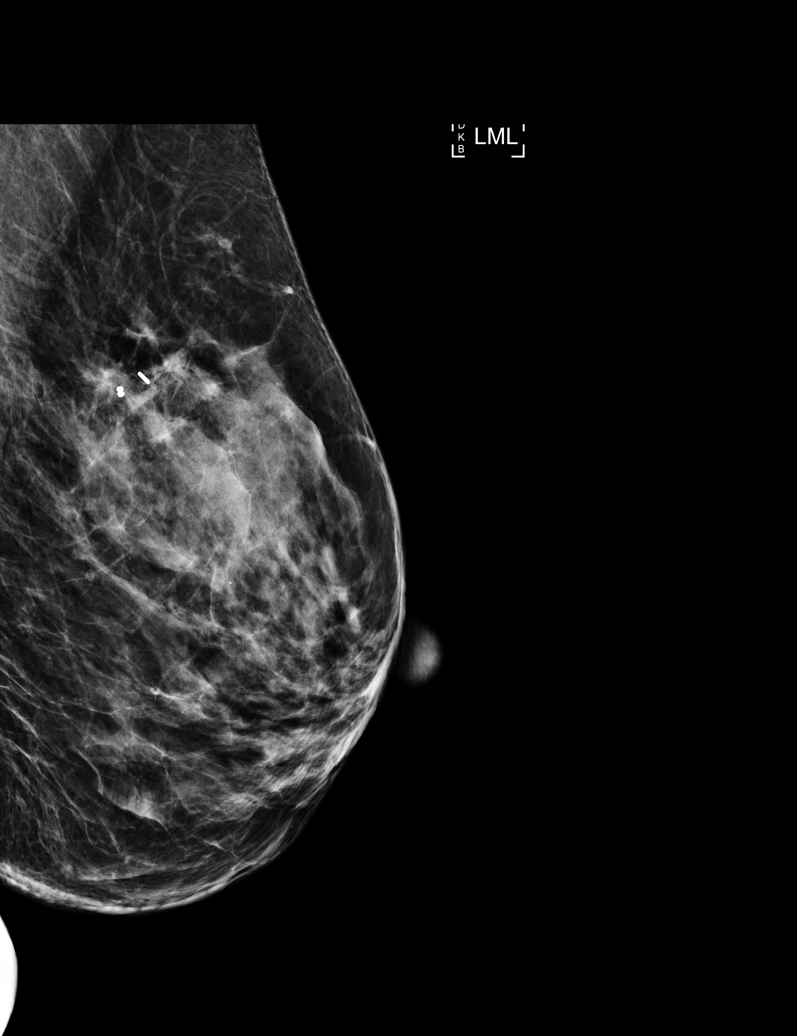

[L CC (2 of 3)]
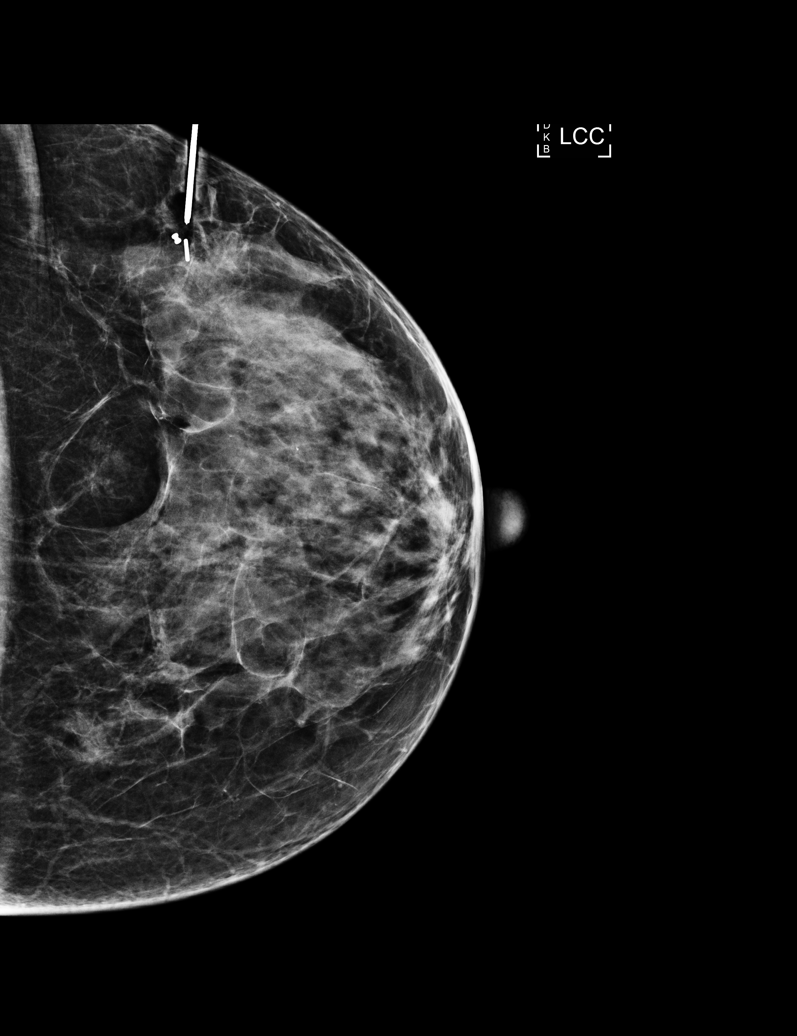

[L CC (3 of 3)]
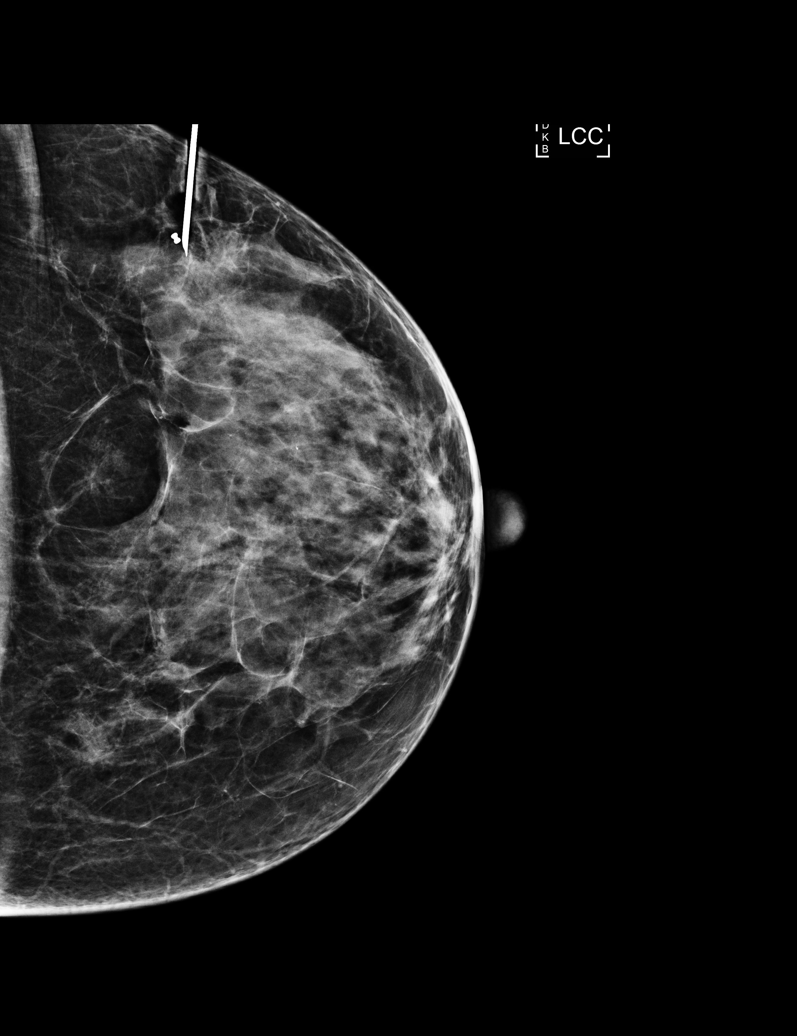

[6 of 6 positions shown; findings below may reference images not displayed]

FINDINGS: Patient presents for radioactive seed localization prior to
excisional biopsies from both breasts. I met with the patient and we
discussed the procedure of seed localization including benefits and
alternatives. We discussed the high likelihood of a successful
procedure. We discussed the risks of the procedure including
infection, bleeding, tissue injury and further surgery. We discussed
the low dose of radioactivity involved in the procedure. Informed,
written consent was given.

The usual time-out protocol was performed immediately prior to the
procedure.

# 1) RIGHT BREAST:

Using mammographic guidance, sterile technique with chlorhexidine as
skin antisepsis, 1% lidocaine as local anesthetic, an X-OW7
radioactive seed was used to localize the barbell shaped tissue
marker clip associated with the biopsy-proven ALH in the LOWER INNER
QUADRANT using a lateral approach. The follow-up mammogram images
confirm that the seed is appropriately positioned immediately
adjacent to the clip. The images are marked for Dr. Juliene.

Follow-up survey of the patient confirms the presence of the
radioactive seed.

Order number of X-OW7 seed: [id number]

Total activity: 0.248 mCi

Reference Date: 01/20/2020

# [DATE]) LEFT BREAST:

Using mammographic guidance, sterile technique with chlorhexidine as
skin antisepsis, 1% lidocaine as local anesthetic, an X-OW7
radioactive seed was used to localize the barbell shaped tissue
marker clip in the UPPER OUTER QUADRANT using a lateral approach.
The follow-up mammogram images confirm that the seed is
appropriately position immediately adjacent to the clip. The images
are marked for Dr. Juliene.

Order number of X-OW7 seed: [id number]

Total activity: 0.248 mCi

Reference Date: 01/20/2020

The patient tolerated the procedure well and was released from the
[REDACTED]. She was given instructions regarding seed removal.
IMPRESSION: Radioactive seed localization of both breasts. No apparent
complications.

## 2022-11-26 IMAGING — MG MM BREAST SURGICAL SPECIMEN
1 series · 2 of 2 positions shown · non-contrast
Comparison: Previous exam(s).

CLINICAL DATA: Status post seed localized LEFT lumpectomy.

EXAM:
SPECIMEN RADIOGRAPH OF THE LEFT BREAST

[Series 1: L · left · 0.07mm/px · 2 of 2 slices shown]
[im 1/2]
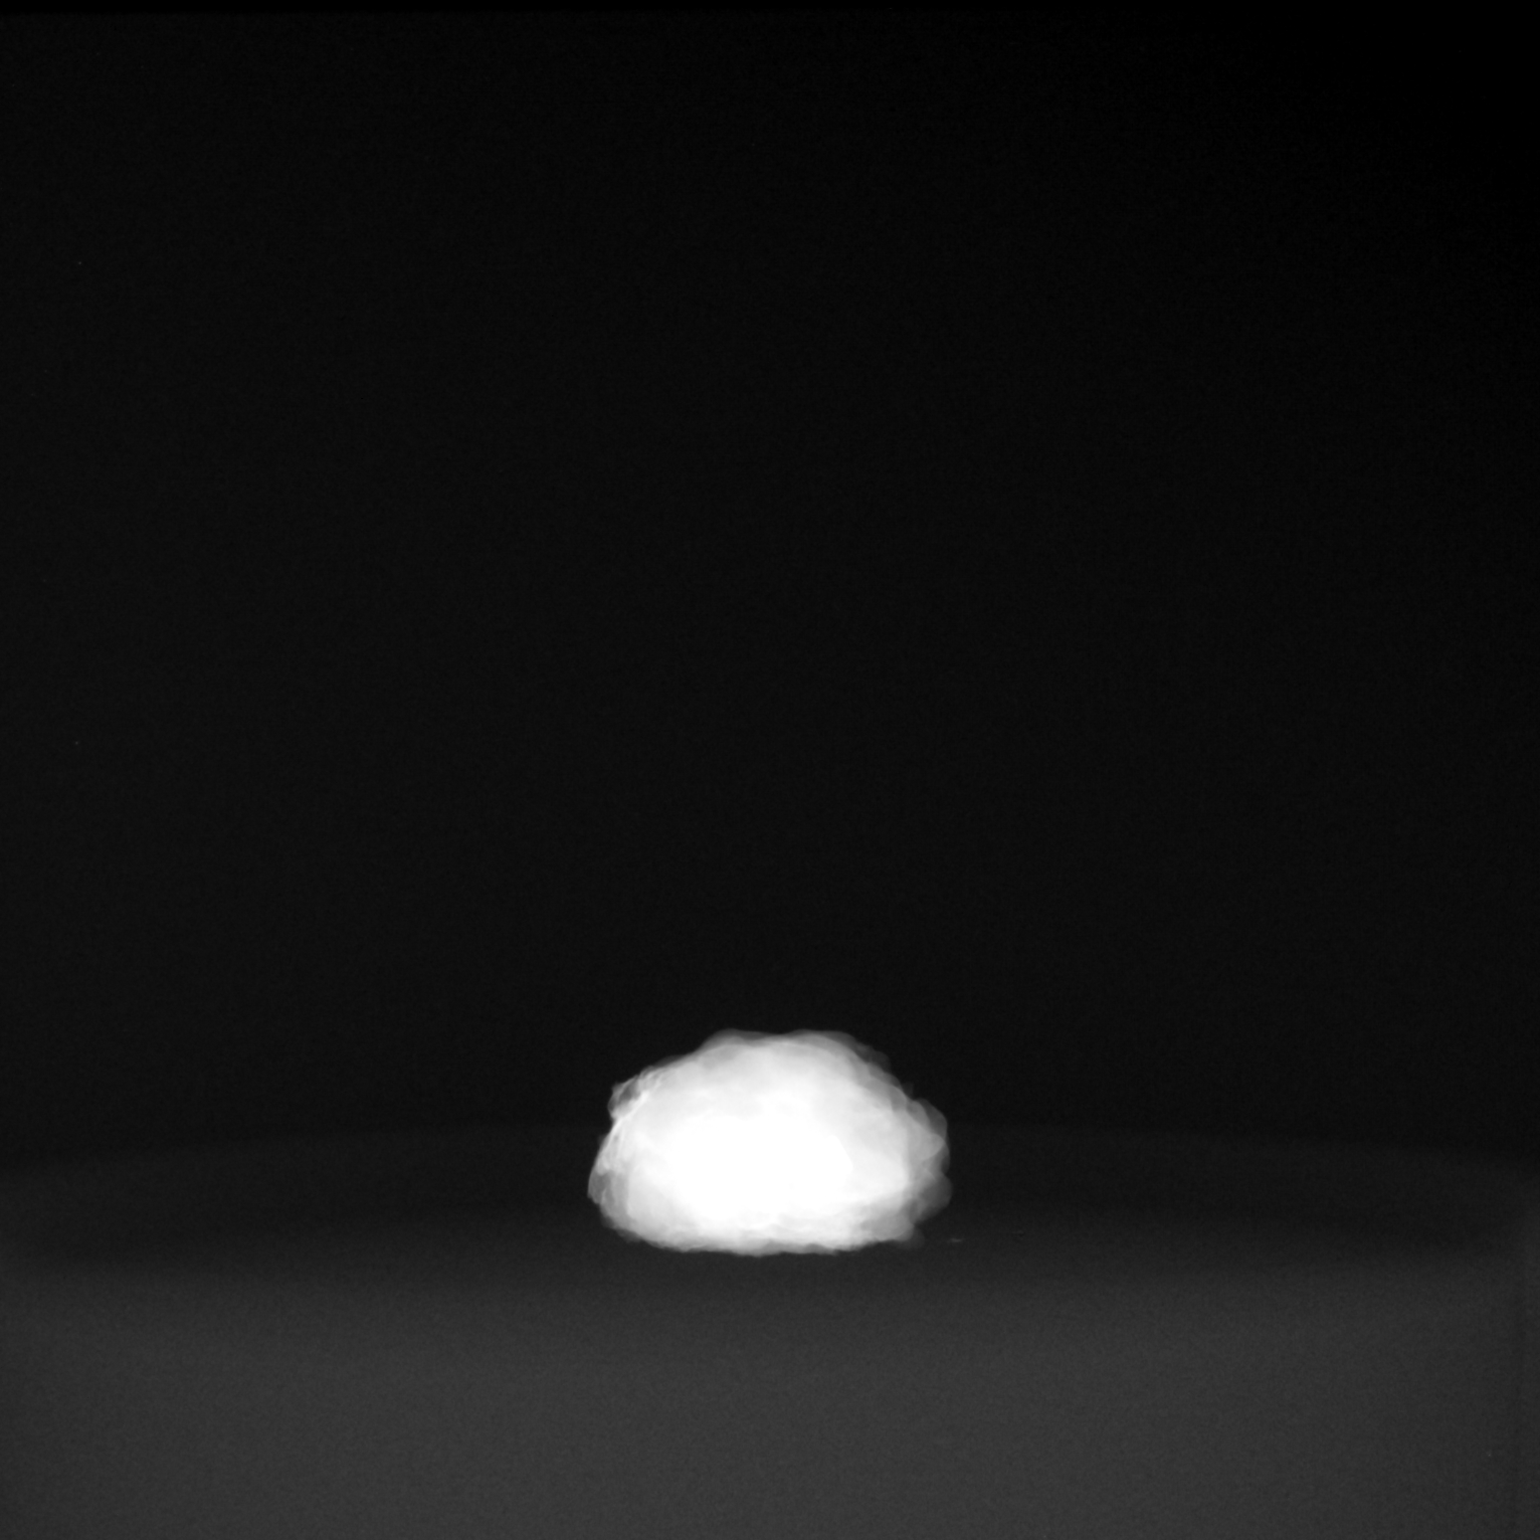
[im 2/2]
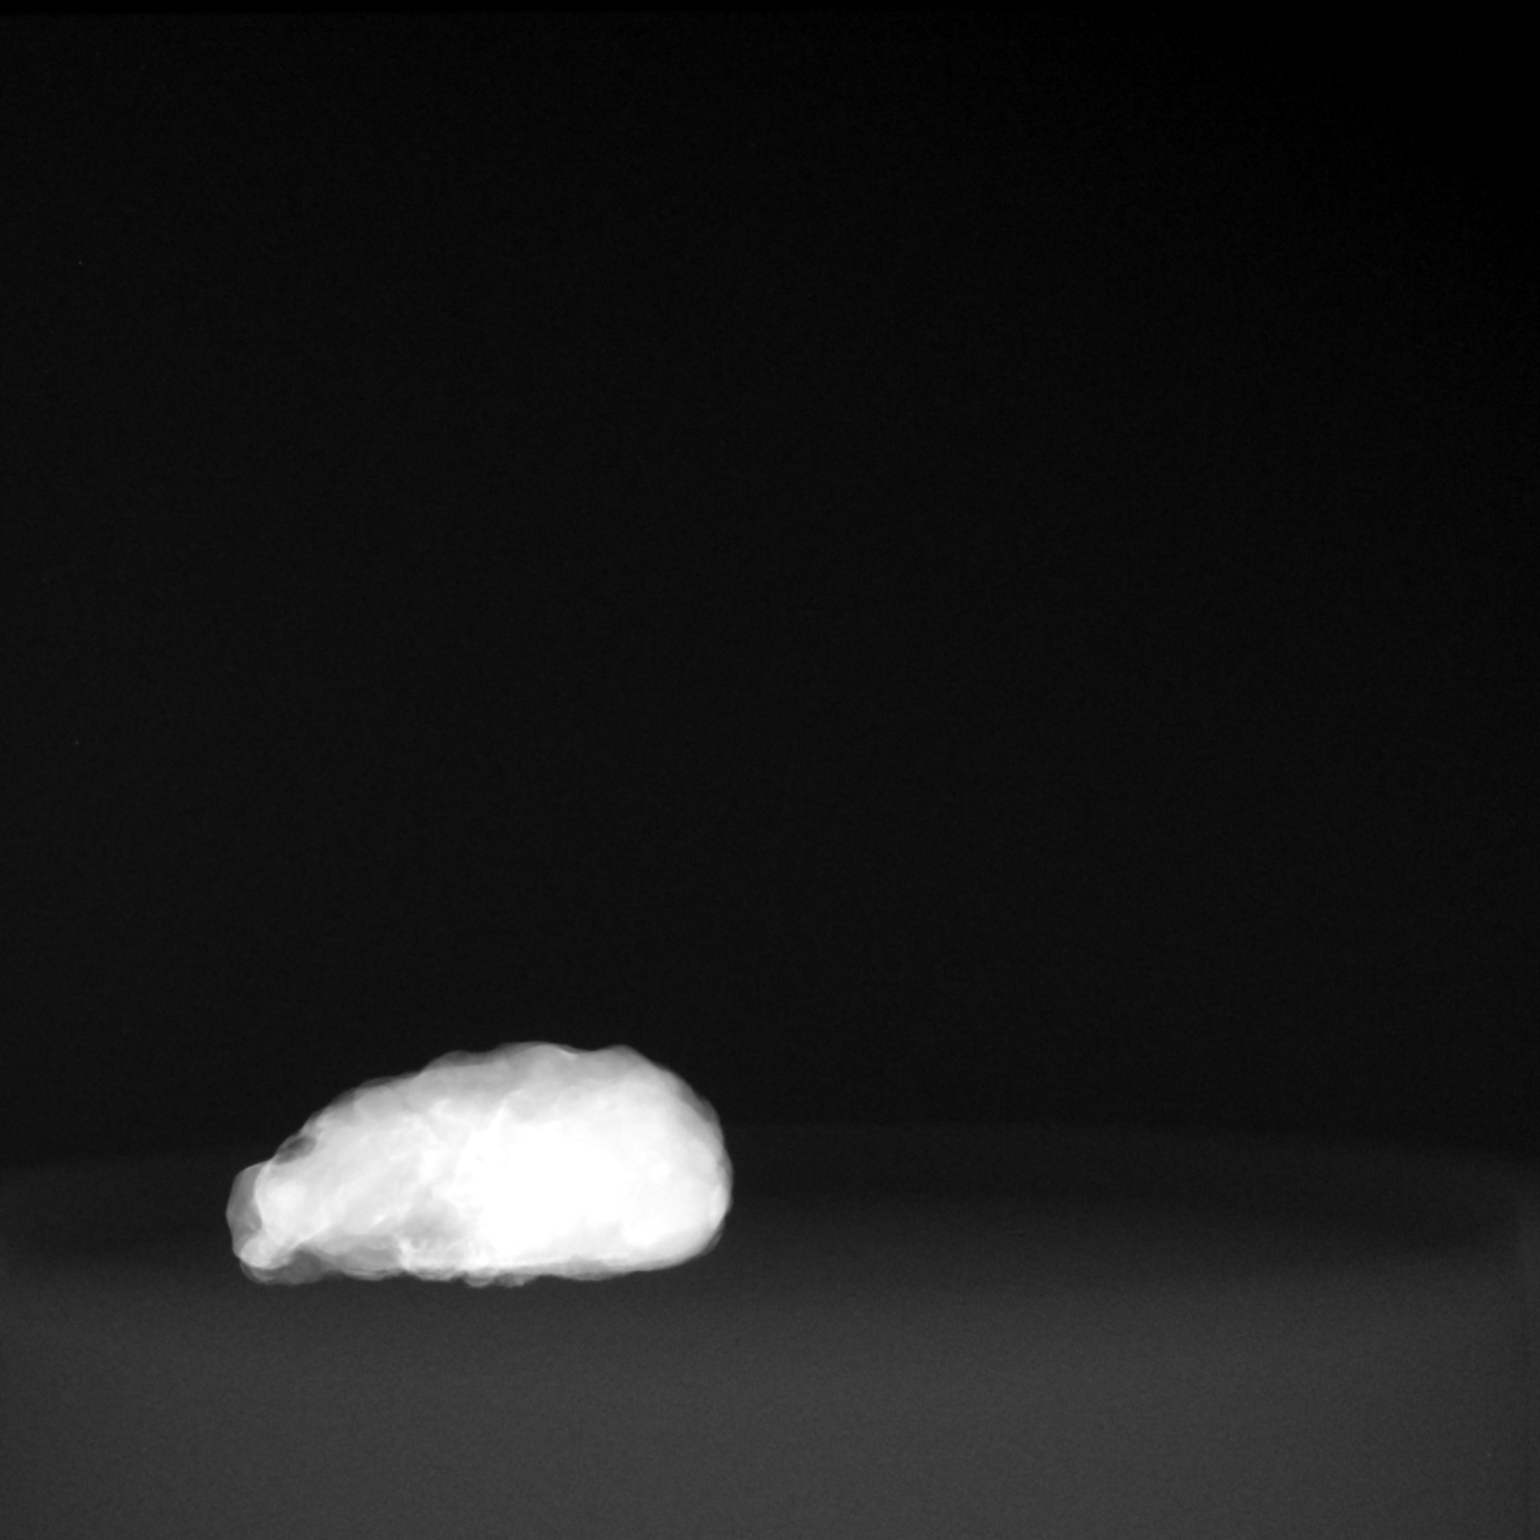

[2 of 2 positions shown; findings below may reference images not displayed]

FINDINGS: Status post excision of the left breast. The radioactive seed and
barbell shaped biopsy marker clip are present, completely intact.
The findings are discussed with the operating room nurse at the time
of interpretation.
IMPRESSION: Specimen radiograph of the left breast.

## 2022-11-28 ENCOUNTER — Other Ambulatory Visit: Payer: Self-pay

## 2022-11-29 ENCOUNTER — Inpatient Hospital Stay: Payer: BC Managed Care – PPO | Attending: Hematology and Oncology | Admitting: Hematology and Oncology

## 2022-11-29 ENCOUNTER — Other Ambulatory Visit: Payer: Self-pay

## 2022-11-29 VITALS — BP 120/76 | HR 65 | Temp 97.3°F | Resp 16 | Wt 159.9 lb

## 2022-11-29 DIAGNOSIS — Z8042 Family history of malignant neoplasm of prostate: Secondary | ICD-10-CM | POA: Insufficient documentation

## 2022-11-29 DIAGNOSIS — Z923 Personal history of irradiation: Secondary | ICD-10-CM | POA: Insufficient documentation

## 2022-11-29 DIAGNOSIS — Z803 Family history of malignant neoplasm of breast: Secondary | ICD-10-CM | POA: Diagnosis not present

## 2022-11-29 DIAGNOSIS — Z8041 Family history of malignant neoplasm of ovary: Secondary | ICD-10-CM | POA: Diagnosis not present

## 2022-11-29 DIAGNOSIS — C50011 Malignant neoplasm of nipple and areola, right female breast: Secondary | ICD-10-CM | POA: Diagnosis not present

## 2022-11-29 DIAGNOSIS — Z8052 Family history of malignant neoplasm of bladder: Secondary | ICD-10-CM | POA: Diagnosis not present

## 2022-11-29 DIAGNOSIS — Z853 Personal history of malignant neoplasm of breast: Secondary | ICD-10-CM | POA: Diagnosis not present

## 2022-11-29 NOTE — Progress Notes (Signed)
[facility]  Telephone:([phone] Fax:([phone]) [phone]     ID: Cheyenne Garner DOB: [birth year]  MR#: [id number]  ZHY#:[id number]  Patient Care Team: Bradd Canary, MD as PCP - General (Family Medicine) Thomasene Ripple, DO as PCP - Cardiology (Cardiology) Griselda Miner, MD as Consulting Physician (General Surgery) Dorothy Puffer, MD as Consulting Physician (Radiation Oncology) Willis Modena, MD as Consulting Physician (Gastroenterology) Haverstock, Elvin So, MD as Referring Physician (Dermatology) Donnelly Angelica, RN as Oncology Nurse Navigator Pershing Proud, RN as Oncology Nurse Navigator Huel Cote, MD as Consulting Physician (Obstetrics and Gynecology) Rachel Moulds, MD  CHIEF COMPLAINT: Estrogen receptor positive breast cancer  CURRENT TREATMENT:  None  INTERVAL HISTORY:  Cheyenne Garner returns today for follow up.  Since her last visit here, she says she has tried the tamoxifen but stopped it about a month ago.  She said she was getting too anxious about the risk of blood clots and secondary cancers from tamoxifen.  She actually tolerated the lower dose extremely well.  She has also noticed significant improvement in the left breast, the breast has finally healed.  She still continues to have some intermittent muscle cramps in the left chest wall from time to time.  She exercises daily and does stretching.  She denies any other systemic symptoms.  She recently had a mammogram and would like to continue mammogram alternating with MRI for breast cancer screening.  Rest of the pertinent 10 point ROS reviewed and neg.  REVIEW OF SYSTEMS:  COVID 19 VACCINATION STATUS: Pfizer x1, most recently 01/2019   HISTORY OF CURRENT ILLNESS: From the original intake note:  Cheyenne Garner was initially evaluated in 05/2017 for a right nipple/breast mass. Diagnostic mammography showed no evidence of malignancy, and she was subsequently referred to Dr. Carolynne Edouard for further  evaluation. She elected for follow up of the lesion. She was then seen by dermatology, who drained the lesion. The lesion resolved but began to reoccur 6 months later. She underwent bilateral diagnostic mammography with tomography and right breast ultrasonography at [facility] on 10/21/2019 showing: breast density category C; dilated duct and nipple discharge from right breast; no intraductal mass or mammographic abnormality identified; right axilla negative for adenopathy.  She underwent breast MRI on 11/11/2019 showing: breast composition C; 1.8 cm linear clumped non-mass enhancement within lower right breast, 6 o'clock; 8 mm irregular enhancing mass within upper-outer left breast; benign-appearing 1.5 cm nonenhancing fluid-intensity mass/collection within right nipple with probable subareolar extension, corresponding to clinical area of concern.  Accordingly on 12/04/2019 she proceeded to biopsy of the bilateral breast areas in question. The pathology from this procedure ([id number]) showed:  1. Right Breast  - fibrocystic changes  - focal atypical lobular hyperplasia  --concordant   2. Left Breast  - benign breast tissue --discordant  As noted, the right breast biopsy was found to be concordant, but the left breast biopsy was discordant. She proceeded to bilateral lumpectomies on 02/12/2020 under Dr. Carolynne Edouard. Pathology from the procedure ([id number]) showed: 1. Right Breast  - fibrocystic change  - columnar cell change  - usual ductal hyperplasia  - intraductal papilloma  - associated microcalcifications 2. 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 Proliferation marker Ki67 at 2%. HER2 equivocal by immunohistochemistry (  2+),  but negative by fluorescent in situ hybridization with a signals ratio 1.19 and number per cell 1.73.  She underwent re-excision of the positive margin, as well as left sentinel lymph node biopsy, on 03/04/2020. Pathology [id number]) showed: no residual carcinoma; benign lymph nodes (0/3).   Cancer Staging  Breast cancer in female [facility]) Staging form: Breast, AJCC 8th Edition - Pathologic stage from 03/09/2020: Stage IA (pT1a, pN0, cM0, G2, ER+, PR+, HER2-) - Signed by Lowella Dell, MD on 03/16/2020 Stage prefix: Initial diagnosis Multigene prognostic tests performed: None Histologic grading system: 3 grade system  The patient's subsequent history is as detailed below.   PAST MEDICAL HISTORY: Past Medical History:  Diagnosis Date   Allergies    Anxiety    Asthma    cough variant asthma developed in last couple of years   Bipolar 1 disorder (HCC)    Breast cancer (HCC) 02/12/2020   left lumpectomy IDC w/radiation no chemo   Cervical cancer screening 09/12/2016   Colon polyp 09/12/2016   Family history of bladder cancer    Family history of breast cancer    Family history of ovarian cancer    Family history of prostate cancer    Hx of colonic polyp 09/12/2016   Colonoscopy 2017 with 1 small polyp, done by Dr Dulce Sellar, repeat colonoscopy in 5 years, 2022   Hypothyroidism    Personal history of radiation therapy    Preventative health care 07/23/2015   Thyroid disease     PAST SURGICAL HISTORY: Past Surgical History:  Procedure Laterality Date   APPENDECTOMY     BREAST LUMPECTOMY WITH RADIOACTIVE SEED LOCALIZATION Bilateral 02/12/2020   Procedure: BILATERAL BREAST LUMPECTOMY WITH RADIOACTIVE SEED LOCALIZATION;  Surgeon: Griselda Miner, MD;  Location: [facility];  Service: General;  Laterality: Bilateral;   RE-EXCISION OF BREAST LUMPECTOMY Left 03/04/2020   Procedure: LEFT BREAST MARGIN REEXCISION;  Surgeon: Griselda Miner, MD;  Location: [facility];  Service: General;  Laterality: Left;   SENTINEL NODE BIOPSY Left 03/04/2020   Procedure: SENTINEL NODE BIOPSY;  Surgeon: Griselda Miner, MD;  Location: [facility];  Service: General;  Laterality: Left;   SKIN BIOPSY     back moles removed (precancer?)    FAMILY HISTORY: Family History  Problem Relation Age of Onset   Arthritis Mother    Dementia Mother        alzheimers   Cancer Father        bladder   Hypertension Father    Heart disease Father        MI, s/p triple bypass at age 37   Neuropathy Father        toxic peripheral    Other Father        peripheral neuropathy   Cholelithiasis Sister    Aortic aneurysm Sister 28   Aneurysm Sister        descending aortic    Mental retardation Sister        ADD, schizoaffective d/o   Obesity Sister    Depression Brother    Arthritis Brother        back disease   Prostate cancer Paternal Uncle        spread to bladder, d. 60s/70s   Dementia Maternal Grandmother    Osteoarthritis Maternal Grandmother    Macular degeneration Maternal Grandfather    Osteoarthritis Maternal Grandfather    Congestive Heart Failure Paternal Grandmother    Cancer Paternal  Grandfather        young, lung cancer?, heavy smoker   Ovarian cancer Cousin 3       maternal second cousin (MGF's great-niece)   Breast cancer Cousin 60       paternal first cousin   Cancer Niece 11       soft tissue sarcoma  From the genetic counselors note (February 2022):  "Cheyenne Garner's mother is alive at age 73 and may have had basal cell carcinoma of her face (unconfirmed). There were three maternal aunts. There is no known cancer among maternal aunts/uncles or maternal first cousins. Cheyenne Garner maternal grandmother died at age 38 without cancer. Her maternal grandfather died at age 42 without cancer. A maternal second cousin (MGF's great-niece) died from ovarian cancer at age 72. A maternal first cousin once removed (MGF's niece) had an unknown type  of gynecologic cancer in her 62s.    Cheyenne Garner father is alive at age 30 and was diagnosed with bladder cancer at age 30. There are two paternal aunts and four paternal uncles. Once uncle died in his late 90s or early 61s with prostate cancer and bladder cancer. One paternal first cousin was diagnosed with breast cancer around age 71. Cheyenne Garner paternal grandmother died in her 31s without cancer. Her paternal grandfather died in his 63s from an unknown type of cancer (not colon cancer, possibly lung cancer). A paternal first cousin once removed (PGF's nephew) had lung cancer in his 46s, and died in his 50s with prostate cancer and bladder cancer.  "   GYNECOLOGIC HISTORY:   No LMP recorded. Patient is postmenopausal. Menarche: 57 years old Age at first live birth: 57 years old GX P 2 LMP age 52 Contraceptive  HRT yes at least 4 years  Hysterectomy? no BSO? 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  Her husband Cheyenne Garner is an Art gallery manager.  He takes care of our blood analyzer is here and throughout the system.  Son Cheyenne Garner started forestry and lives in [place] week where he works for [employer] restoration taking care of their trails.  Son Cheyenne Garner is 2   ADVANCED DIRECTIVES: In the absence of any documents to the contrary the patient's husband is her healthcare power of attorney   HEALTH MAINTENANCE: Social History   Tobacco Use   Smoking status: Never   Smokeless tobacco: Never  Substance Use Topics   Alcohol use: Yes    Comment: social   Drug use: No     Colonoscopy: 02/2015, Dr. Dulce Sellar, recall 2022  PAP: 08/2016, negative  Bone density: Never done   Allergies  Allergen Reactions   Gentamycin [Gentamicin] Other (See Comments)    Reacted to eye ointment, swelling red painful eyes    Current Outpatient Medications  Medication Sig Dispense Refill   albuterol (VENTOLIN HFA) 108 (90 Base) MCG/ACT inhaler Inhale 2 puffs into the  lungs every 6 (six) hours as needed for wheezing or shortness of breath. 6.7 g 2   ASHWAGANDHA PO Take 2 tablets by mouth as needed (anxiety).     CALCIUM PO Take 2 tablets by mouth in the morning, at noon, and at bedtime. Bone Up     Carboxymethylcellulose Sodium (EYE DROPS OP) Place 1 drop into both eyes in the morning and at bedtime. Systemic Balance     Cyanocobalamin (VITAMIN B-12 PO) Take 1 capsule by mouth in the morning and at bedtime.  lithium carbonate (LITHOBID) 300 MG ER tablet Take 1 tablet (300 mg total) by mouth 2 (two) times daily. 60 tablet 5   lithium carbonate (LITHOBID) 300 MG ER tablet Take 1 tablet (300 mg total) by mouth 2 (two) times daily. 60 tablet 5   Probiotic Product (PROBIOTIC-10 PO) Take 1 capsule by mouth at bedtime.     tamoxifen (NOLVADEX) 10 MG tablet Take 1 tablet (10 mg total) by mouth daily. 90 tablet 1   thyroid (NP THYROID) 90 MG tablet Take 1 tablet (90 mg total) by mouth in the morning. 90 tablet 3   VITAMIN D PO Take 5,000 Units by mouth daily.     Zinc 50 MG TABS Take 50 mg by mouth daily.     No current facility-administered medications for this visit.    OBJECTIVE: White woman in no acute distress  There were no vitals filed for this visit.     There is no height or weight on file to calculate BMI.   Wt Readings from Last 3 Encounters:  09/28/22 156 lb (70.8 kg)  09/26/22 155 lb 8 oz (70.5 kg)  05/29/22 154 lb 6.4 oz (70 kg)      ECOG FS:1 - Symptomatic but completely ambulatory  Physical Exam Constitutional:      Appearance: Normal appearance.  Chest:     Comments: Bilateral breasts inspected.  Left breast appears much better.  The swelling and erythema in the left breast has resolved.  Postradiation changes noted in the left breast.  The upper portion of the left breast remains dense, this is a chronic finding according to the patient.  Right breast normal to inspection and palpation. Musculoskeletal:     Cervical back: Normal  range of motion and neck supple. No rigidity.  Lymphadenopathy:     Cervical: No cervical adenopathy.  Neurological:     Mental Status: She is alert.      LAB RESULTS:  CMP     Component Value Date/Time   NA 140 09/28/2022 0946   NA 143 04/21/2020 0843   K 4.6 09/28/2022 0946   CL 102 09/28/2022 0946   CO2 29 09/28/2022 0946   GLUCOSE 88 09/28/2022 0946   BUN 17 09/28/2022 0946   BUN 13 04/21/2020 0843   CREATININE 0.93 09/28/2022 0946   CREATININE 0.92 05/29/2022 1428   CALCIUM 9.6 09/28/2022 0946   PROT 7.1 09/28/2022 0946   ALBUMIN 4.2 09/28/2022 0946   AST 15 09/28/2022 0946   AST 14 (L) 05/29/2022 1428   ALT 12 09/28/2022 0946   ALT 12 05/29/2022 1428   ALKPHOS 64 09/28/2022 0946   BILITOT 0.7 09/28/2022 0946   BILITOT 0.8 05/29/2022 1428   GFRNONAA >60 05/29/2022 1428   GFRAA >60 06/25/2014 1948    No results found for: "TOTALPROTELP", "ALBUMINELP", "A1GS", "A2GS", "BETS", "BETA2SER", "GAMS", "MSPIKE", "SPEI"  Lab Results  Component Value Date   WBC 4.8 09/28/2022   NEUTROABS 2.8 09/28/2022   HGB 14.5 09/28/2022   HCT 44.5 09/28/2022   MCV 92.3 09/28/2022   PLT 166.0 09/28/2022    No results found for: "LABCA2"  No components found for: "WUJWJX914"  No results for input(s): "INR" in the last 168 hours.  No results found for: "LABCA2"  No results found for: "NWG956"  No results found for: "CAN125"  No results found for: "CAN153"  No results found for: "CA2729"  No components found for: "HGQUANT"  No results found for: "CEA1", "CEA" / No results found  for: "CEA1", "CEA"   No results found for: "AFPTUMOR"  No results found for: "CHROMOGRNA"  No results found for: "KPAFRELGTCHN", "LAMBDASER", "KAPLAMBRATIO" (kappa/lambda light chains)  No results found for: "HGBA", "HGBA2QUANT", "HGBFQUANT", "HGBSQUAN" (Hemoglobinopathy evaluation)   No results found for: "LDH"  No results found for: "IRON", "TIBC", "IRONPCTSAT" (Iron and  TIBC)  No results found for: "FERRITIN"  Urinalysis    Component Value Date/Time   COLORURINE YELLOW 06/25/2014 1850   APPEARANCEUR CLEAR 06/25/2014 1850   LABSPEC 1.029 06/25/2014 1850   PHURINE 5.5 06/25/2014 1850   GLUCOSEU NEGATIVE 06/25/2014 1850   HGBUR TRACE (A) 06/25/2014 1850   BILIRUBINUR NEGATIVE 06/25/2014 1850   KETONESUR NEGATIVE 06/25/2014 1850   PROTEINUR NEGATIVE 06/25/2014 1850   UROBILINOGEN 0.2 06/25/2014 1850   NITRITE NEGATIVE 06/25/2014 1850   LEUKOCYTESUR SMALL (A) 06/25/2014 1850    STUDIES: MM DIAG BREAST TOMO BILATERAL  Result Date: 11/18/2022 CLINICAL DATA:  57 year old female with history of left breast cancer post lumpectomy January 2022. Also with history of excision at the same time for right breast atypical lobular hyperplasia. Patient also has a history of cellulitis March 2024 following radiation therapy which has since resolved although the patient does report persistent pain/tenderness along the inferior aspect of the left breast most notable with deep inspiration. EXAM: DIGITAL DIAGNOSTIC BILATERAL MAMMOGRAM WITH TOMOSYNTHESIS AND CAD TECHNIQUE: Bilateral digital diagnostic mammography and breast tomosynthesis was performed. The images were evaluated with computer-aided detection. COMPARISON:  Previous exam(s). ACR Breast Density Category c: The breasts are heterogeneously dense, which may obscure small masses. FINDINGS: No suspicious masses or calcifications are seen in either breast. Postsurgical changes are again seen in the bilateral breast related to prior right breast excision for atypical lobular hyperplasia and left lumpectomy. Spot compression magnification MLO view of the left lumpectomy site was performed. There is no mammographic evidence of locally recurrent malignancy. IMPRESSION: Stable lumpectomy changes in the left breast. No mammographic evidence of malignancy in either breast. RECOMMENDATION: 1.  Screening mammogram in one  year.(Code:SM-B-01Y) 2. The patient is now two or more years post lumpectomy and therefore may return to annual screening mammography, however given history of breast cancer the patient does remain eligible for annual diagnostic mammography if indicated. 3. The American Cancer Society recommends annual MRI and mammography in patients with an estimated lifetime risk of developing breast cancer greater than 20 - 25%, or who are known or suspected to be positive for the breast cancer gene. I have discussed the findings and recommendations with the patient. If applicable, a reminder letter will be sent to the patient regarding the next appointment. BI-RADS CATEGORY  2: Benign. Electronically Signed   By: Edwin Cap M.D.   On: 11/18/2022 07:54     ELIGIBLE FOR AVAILABLE RESEARCH PROTOCOL: no  ASSESSMENT: 57 y.o. [facility] woman status post bilateral lumpectomies 02/12/2020, showing  (a) in the right breast, an intraductal papilloma and atypical lobular hyperplasia  (b) in the left breast, a pT1a pNX, stage IA invasive ductal carcinoma, grade 2, with positive margins   (i) the invasive disease was estrogen and progesterone receptor positive, HER-2 not amplified, with an MIB-1 of 2%   (ii) additional surgery 03/04/2020 clear margins   (iii) left axillary sentinel lymph node sampling 03/04/2020 removed 2 lymph nodes both negative  (1) adjuvant radiation completed 04/30/2020  (2) genetics testing 04/02/2020 through the [facility]-Expanded +RNAinsight Panel found no deleterious mutations in AIP, ALK, APC, ATM, AXIN2, BAP1, BARD1, BLM, BMPR1A, BRCA1, BRCA2,  BRIP1, CDC73, CDH1, CDK4, CDKN1B, CDKN2A, CHEK2, CTNNA1, DICER1, FANCC, FH, FLCN, GALNT12, KIF1B, LZTR1, MAX, MEN1, MET, MLH1, MSH2, MSH3, MSH6, MUTYH, NBN, NF1, NF2, NTHL1, PALB2, PHOX2B, PMS2, POT1, PRKAR1A, PTCH1, PTEN, RAD51C, RAD51D, RB1, RECQL, RET, SDHA, SDHAF2, SDHB, SDHC, SDHD, SMAD4, SMARCA4, SMARCB1, SMARCE1, STK11, SUFU, TMEM127, TP53,  TSC1, TSC2, VHL and XRCC2 (sequencing and deletion/duplication); EGFR, EGLN1, HOXB13, KIT, MITF, PDGFRA, POLD1, and POLE (sequencing only); EPCAM and GREM1 (deletion/duplication only).    PLAN:  Cheyenne Garner has decided to stop the tamoxifen, overall she might have taken it for 2 years. She would like to continue alternating mammogram with MRI for breast cancer screening.  On physical exam today, she has this abnormal density which is chronic in the left breast upper outer quadrant, no change.  Her most recent mammogram was without any concern for malignancy MRI due again in May 2025, this has been requested because of dense breasts and inability to continue tamoxifen. Encouraged her healthy exercise regimen. She will continue SBE and contact me with any questions or concerns Otherwise we will see her back in 1 yr. RTC in person in 6 months.  Total time spent: 30 minutes. *Total Encounter Time as defined by the Centers for Medicare and Medicaid Services includes, in addition to the face-to-face time of a patient visit (documented in the note above) non-face-to-face time: obtaining and reviewing outside history, ordering and reviewing medications, tests or procedures, care coordination (communications with other health care professionals or caregivers) and documentation in the medical record.

## 2022-11-30 ENCOUNTER — Telehealth: Payer: Self-pay | Admitting: Hematology and Oncology

## 2022-11-30 NOTE — Telephone Encounter (Signed)
 Left patient a vm regarding upcoming appointment

## 2022-12-08 ENCOUNTER — Other Ambulatory Visit (HOSPITAL_BASED_OUTPATIENT_CLINIC_OR_DEPARTMENT_OTHER): Payer: Self-pay

## 2022-12-26 ENCOUNTER — Ambulatory Visit (HOSPITAL_BASED_OUTPATIENT_CLINIC_OR_DEPARTMENT_OTHER)
Admission: RE | Admit: 2022-12-26 | Discharge: 2022-12-26 | Disposition: A | Discharge status: Home / Self Care | Payer: BC Managed Care – PPO | Source: Ambulatory Visit | Attending: Family Medicine | Admitting: Family Medicine

## 2022-12-26 DIAGNOSIS — Z780 Asymptomatic menopausal state: Secondary | ICD-10-CM | POA: Diagnosis not present

## 2022-12-26 DIAGNOSIS — C50912 Malignant neoplasm of unspecified site of left female breast: Secondary | ICD-10-CM | POA: Diagnosis not present

## 2022-12-26 DIAGNOSIS — C50911 Malignant neoplasm of unspecified site of right female breast: Secondary | ICD-10-CM | POA: Insufficient documentation

## 2022-12-26 DIAGNOSIS — E2839 Other primary ovarian failure: Secondary | ICD-10-CM | POA: Insufficient documentation

## 2022-12-26 DIAGNOSIS — M8588 Other specified disorders of bone density and structure, other site: Secondary | ICD-10-CM | POA: Diagnosis not present

## 2022-12-27 DIAGNOSIS — R5383 Other fatigue: Secondary | ICD-10-CM | POA: Diagnosis not present

## 2022-12-27 DIAGNOSIS — E559 Vitamin D deficiency, unspecified: Secondary | ICD-10-CM | POA: Diagnosis not present

## 2022-12-27 DIAGNOSIS — E039 Hypothyroidism, unspecified: Secondary | ICD-10-CM | POA: Diagnosis not present

## 2022-12-27 DIAGNOSIS — Z131 Encounter for screening for diabetes mellitus: Secondary | ICD-10-CM | POA: Diagnosis not present

## 2023-01-04 ENCOUNTER — Other Ambulatory Visit (HOSPITAL_COMMUNITY): Payer: Self-pay

## 2023-01-04 ENCOUNTER — Other Ambulatory Visit (HOSPITAL_BASED_OUTPATIENT_CLINIC_OR_DEPARTMENT_OTHER): Payer: Self-pay

## 2023-01-04 DIAGNOSIS — E063 Autoimmune thyroiditis: Secondary | ICD-10-CM | POA: Diagnosis not present

## 2023-01-04 DIAGNOSIS — B370 Candidal stomatitis: Secondary | ICD-10-CM | POA: Diagnosis not present

## 2023-01-04 DIAGNOSIS — D8989 Other specified disorders involving the immune mechanism, not elsewhere classified: Secondary | ICD-10-CM | POA: Diagnosis not present

## 2023-01-04 DIAGNOSIS — E7212 Methylenetetrahydrofolate reductase deficiency: Secondary | ICD-10-CM | POA: Diagnosis not present

## 2023-01-04 MED ORDER — THYROID 120 MG PO TABS
120.0000 mg | ORAL_TABLET | Freq: Every day | ORAL | 3 refills | Status: DC
  Filled 2023-01-04: qty 30, 30d supply, fill #0
  Filled 2023-01-04: qty 90, 90d supply, fill #0
  Filled 2023-01-31: qty 30, 30d supply, fill #1
  Filled 2023-03-02 – 2023-03-03 (×3): qty 30, 30d supply, fill #2
  Filled 2023-04-01: qty 30, 30d supply, fill #3
  Filled 2023-05-01: qty 30, 30d supply, fill #4
  Filled 2023-05-31: qty 30, 30d supply, fill #5
  Filled 2023-06-27: qty 30, 30d supply, fill #6

## 2023-01-04 MED ORDER — FLUCONAZOLE 100 MG PO TABS
100.0000 mg | ORAL_TABLET | ORAL | 0 refills | Status: DC
  Filled 2023-01-04: qty 10, 20d supply, fill #0

## 2023-02-21 ENCOUNTER — Other Ambulatory Visit (HOSPITAL_BASED_OUTPATIENT_CLINIC_OR_DEPARTMENT_OTHER): Payer: Self-pay

## 2023-02-26 ENCOUNTER — Other Ambulatory Visit (HOSPITAL_COMMUNITY): Payer: Self-pay

## 2023-03-03 ENCOUNTER — Other Ambulatory Visit (HOSPITAL_BASED_OUTPATIENT_CLINIC_OR_DEPARTMENT_OTHER): Payer: Self-pay

## 2023-03-12 ENCOUNTER — Ambulatory Visit: Payer: Self-pay | Admitting: Family Medicine

## 2023-03-12 NOTE — Telephone Encounter (Signed)
 Copied from CRM [id number]. Topic: Clinical - Red Word Triage >> Mar 12, 2023  9:06 AM Gurney Maxin H wrote: [facility] that prompted transfer to Nurse Triage: Bronchial cough, low grade fever, dry eye or pink eye, pain in right eye bloodshot and draining.   Chief Complaint: Productive cough and Red Eye with drainage Symptoms: Fatigue, red eye, irritated throat, post nasal drip, productive cough,  Frequency: 3-4 days Pertinent Negatives: Patient denies shortness of breath, history of blood clots, recent prolonged travel, chest pain, nausea, vomiting,  Disposition: [] ED /[] Urgent Care (no appt availability in office) / [x] Appointment(In office/virtual)/ []  [facility]/ [] Home Care/ [] Refused Recommended Disposition /[] [place] [facility]/ []  Follow-up with PCP Additional Notes: Patient called and advised that Friday (3 days ago) she left work early feeling generally unwell.  She states that she had a cough and runny nose over the weekend. Home covid test was negative. Patient's right eye is bloodshot and draining.  She states she has bad eye dryness that her eye doctor has had to give her eye drops/steroids for.  She had a low grade fever yesterday of 99.1 She also states that she does not have a fever today or any shortness of breath. Patient states that she took some Mucinex over the weekend that helped break up mucous. Patient states that the mucous is a pale/yellow/tan color. For the past week, right ear has been itchy as well. Patient also denies any history of blood clots, recent prolonged travel, nausea, vomiting, or chest pain. Appointment made for tomorrow 03/13/2023 with Ria Clock at patient's PCP office.  Patient would like a flu/covid test and have her eye checked out to see if it needs a steroid eye drop prescription.  Patient is also advised that if anything gets worse to go to the emergency room.  Patient verbalized understanding.  Reason for Disposition  [1] Eye with yellow  or green discharge, or eyelashes stick together AND [2] NO PCP standing order to call in antibiotic eye drops   (Exception: [place]; continue triage.)    Patient states clear/crusty eye drainage and has had steroid eye drops in the past for same  Answer Assessment - Initial Assessment Questions 1. ONSET: "When did the cough begin?"      3 days ago 2. SEVERITY: "How bad is the cough today?"      Not as bad after taking Mucinex 3. SPUTUM: "Describe the color of your sputum" (none, dry cough; clear, white, yellow, green)     Tan/yellow/pale color 4. HEMOPTYSIS: "Are you coughing up any blood?" If so ask: "How much?" (flecks, streaks, tablespoons, etc.)     no 5. DIFFICULTY BREATHING: "Are you having difficulty breathing?" If Yes, ask: "How bad is it?" (e.g., mild, moderate, severe)    - MILD: No SOB at rest, mild SOB with walking, speaks normally in sentences, can lie down, no retractions, pulse < 100.    - MODERATE: SOB at rest, SOB with minimal exertion and prefers to sit, cannot lie down flat, speaks in phrases, mild retractions, audible wheezing, pulse 100-120.    - SEVERE: Very SOB at rest, speaks in single words, struggling to breathe, sitting hunched forward, retractions, pulse > 120      No 6. FEVER: "Do you have a fever?" If Yes, ask: "What is your temperature, how was it measured, and when did it start?"     No 7. CARDIAC HISTORY: "Do you have any history of heart disease?" (e.g., heart attack, congestive heart  failure)      No 8. LUNG HISTORY: "Do you have any history of lung disease?"  (e.g., pulmonary embolus, asthma, emphysema)     Asthma 9. PE RISK FACTORS: "Do you have a history of blood clots?" (or: recent major surgery, recent prolonged travel, bedridden)     No 10. OTHER SYMPTOMS: "Do you have any other symptoms?" (e.g., runny nose, wheezing, chest pain)       Red eye with drainage, post nasal drip irritating throat 12. TRAVEL: "Have you traveled out of the country in the  last month?" (e.g., travel history, exposures)       No  Answer Assessment - Initial Assessment Questions 1. EYE DISCHARGE: "Is the discharge in one or both eyes?" "What color is it?" "How much is there?" "When did the discharge start?"      Clear/crusty 2. REDNESS OF SCLERA: "Is the redness in one or both eyes?" "When did the redness start?"      Right eye 3. EYELIDS: "Are the eyelids red or swollen?" If Yes, ask: "How much?"      No 4. VISION: "Is there any difficulty seeing clearly?"      No 5. PAIN: "Is there any pain? If Yes, ask: "How bad is it?" (Scale 1-10; or mild, moderate, severe)    - MILD (1-3): doesn't interfere with normal activities     - MODERATE (4-7): interferes with normal activities or awakens from sleep    - SEVERE (8-10): excruciating pain, unable to do any normal activities       mild 6. CONTACT LENS: "Do you wear contacts?"     No 7. OTHER SYMPTOMS: "Do you have any other symptoms?" (e.g., fever, runny nose, cough)     Runny nose, productive cough  Protocols used: Cough - Acute Productive-A-AH, Eye - Pus or Discharge-A-AH

## 2023-03-13 ENCOUNTER — Other Ambulatory Visit (HOSPITAL_BASED_OUTPATIENT_CLINIC_OR_DEPARTMENT_OTHER): Payer: Self-pay

## 2023-03-13 ENCOUNTER — Ambulatory Visit: Payer: BC Managed Care – PPO | Admitting: Family

## 2023-03-13 VITALS — BP 120/82 | HR 92 | Temp 98.5°F | Ht 66.0 in | Wt 158.0 lb

## 2023-03-13 DIAGNOSIS — J019 Acute sinusitis, unspecified: Secondary | ICD-10-CM

## 2023-03-13 MED ORDER — AMOXICILLIN-POT CLAVULANATE 875-125 MG PO TABS
1.0000 | ORAL_TABLET | Freq: Two times a day (BID) | ORAL | 0 refills | Status: AC
  Filled 2023-03-13: qty 14, 7d supply, fill #0

## 2023-03-13 MED ORDER — PREDNISONE 20 MG PO TABS
20.0000 mg | ORAL_TABLET | Freq: Every day | ORAL | 0 refills | Status: DC
  Filled 2023-03-13: qty 5, 5d supply, fill #0

## 2023-03-13 NOTE — Progress Notes (Signed)
 Cheyenne Garner is a 58 y.o. 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, estrogen receptor positive (HCC) 05/18/2020   Genetic testing 04/07/2020   Hypothyroidism    Anxiety    Allergies    FH: thoracic aortic aneurysm 03/23/2020   Family history of breast cancer    Family history of ovarian cancer    Family history of bladder cancer    Family history of prostate cancer    Breast cancer in female (HCC) 03/09/2020   Bilateral dry eyes 01/27/2019   Cervical cancer screening 09/12/2016   Hx of colonic polyp 09/12/2016   Colon polyp 09/12/2016   Preventative health care 07/23/2015   Asthma, chronic    Thyroid disease    Bipolar affective (HCC)    Urinary incontinence 12/25/2011    Current Outpatient Medications  Medication Sig Dispense Refill   albuterol (VENTOLIN HFA) 108 (90 Base) MCG/ACT inhaler Inhale 2 puffs into the lungs every 6 (six) hours as needed for wheezing or shortness of breath. 6.7 g 2   amoxicillin-clavulanate (AUGMENTIN) 875-125 MG tablet Take 1 tablet by mouth 2 (two) times daily for 7 days. 14 tablet 0   ASHWAGANDHA PO Take 2 tablets by mouth as needed (anxiety).     CALCIUM PO Take 2 tablets by mouth in the morning, at noon, and at bedtime. Bone Up     Carboxymethylcellulose Sodium (EYE DROPS OP) Place 1 drop into both eyes in the morning and at bedtime. Systemic Balance     Cyanocobalamin (VITAMIN B-12 PO) Take 1 capsule by mouth in the morning and at bedtime.     lithium carbonate (LITHOBID) 300 MG ER tablet Take 1 tablet (300 mg total) by mouth 2 (two) times daily. 60 tablet 5   predniSONE (DELTASONE) 20 MG tablet Take  1 tablet (20 mg total) by mouth daily with breakfast. 5 tablet 0   Probiotic Product (PROBIOTIC-10 PO) Take 1 capsule by mouth at bedtime.     thyroid (NP THYROID) 120 MG tablet Take 1 tablet (120 mg total) by mouth daily. 90 tablet 3   VITAMIN D PO Take 5,000 Units by mouth daily.     Zinc 50 MG TABS Take 50 mg by mouth daily.     No current facility-administered medications for this visit.    Allergies: Gentamycin [gentamicin]  Past Medical History:  Diagnosis Date   Allergies    Anxiety    Asthma    cough variant asthma developed in last couple of years   Bipolar 1 disorder (HCC)    Breast cancer (HCC) 02/12/2020   left lumpectomy IDC w/radiation no chemo   Cervical cancer screening 09/12/2016   Colon polyp 09/12/2016   Family history of bladder cancer    Family history of breast cancer    Family history of ovarian cancer    Family history of prostate cancer    Hx of colonic polyp 09/12/2016   Colonoscopy 2017 with 1 small polyp, done by Dr Dulce Sellar, repeat colonoscopy in 5 years, 2022   Hypothyroidism    Personal history of radiation therapy    Preventative health care 07/23/2015   Thyroid disease  Past Surgical History:  Procedure Laterality Date   APPENDECTOMY     BREAST LUMPECTOMY WITH RADIOACTIVE SEED LOCALIZATION Bilateral 02/12/2020   Procedure: BILATERAL BREAST LUMPECTOMY WITH RADIOACTIVE SEED LOCALIZATION;  Surgeon: Griselda Miner, MD;  Location: [facility];  Service: General;  Laterality: Bilateral;   RE-EXCISION OF BREAST LUMPECTOMY Left 03/04/2020   Procedure: LEFT BREAST MARGIN REEXCISION;  Surgeon: Griselda Miner, MD;  Location: [facility];  Service: General;  Laterality: Left;   SENTINEL NODE BIOPSY Left 03/04/2020   Procedure: SENTINEL NODE BIOPSY;  Surgeon: Griselda Miner, MD;  Location: [facility];  Service: General;  Laterality: Left;   SKIN BIOPSY     back moles removed (precancer?)    Family History   Problem Relation Age of Onset   Arthritis Mother    Dementia Mother        alzheimers   Cancer Father        bladder   Hypertension Father    Heart disease Father        MI, s/p triple bypass at age 52   Neuropathy Father        toxic peripheral    Other Father        peripheral neuropathy   Cholelithiasis Sister    Aortic aneurysm Sister 63   Aneurysm Sister        descending aortic    Mental retardation Sister        ADD, schizoaffective d/o   Obesity Sister    Depression Brother    Arthritis Brother        back disease   Prostate cancer Paternal Uncle        spread to bladder, d. 60s/70s   Dementia Maternal Grandmother    Osteoarthritis Maternal Grandmother    Macular degeneration Maternal Grandfather    Osteoarthritis Maternal Grandfather    Congestive Heart Failure Paternal Grandmother    Cancer Paternal Grandfather        young, lung cancer?, heavy smoker   Ovarian cancer Cousin 70       maternal second cousin (MGF's great-niece)   Breast cancer Cousin 60       paternal first cousin   Cancer Niece 11       soft tissue sarcoma    Social History   Tobacco Use   Smoking status: Never   Smokeless tobacco: Never  Substance Use Topics   Alcohol use: Yes    Comment: social    Subjective:   Cough/ congestion x 5 days; started suddenly on Friday with body aches, chest congestion/ cough; does have underlying asthma- occasional wheezing; no fever;  Has tried OTC Mucinex with limited relief;   Objective:  Vitals:   03/13/23 0937  BP: 120/82  Pulse: 92  Temp: 98.5 F (36.9 C)  TempSrc: Oral  SpO2: 97%  Weight: 158 lb (71.7 kg)  Height: 5\' 6"  (1.676 m)    General: Well developed, well nourished, in no acute distress  Skin : Warm and dry.  Head: Normocephalic and atraumatic  Eyes: Sclera and conjunctiva clear; pupils round and reactive to light; extraocular movements intact  Ears: External normal; canals clear; tympanic membranes congested  bilaterally Oropharynx: Pink, supple. No suspicious lesions  Neck: Supple without thyromegaly, adenopathy  Lungs: Respirations unlabored; clear to auscultation bilaterally without wheeze, rales, rhonchi  CVS exam: normal rate and regular rhythm.  Abdomen: Soft; nontender; nondistended; normoactive bowel sounds; no masses or hepatosplenomegaly  Musculoskeletal: No  deformities; no active joint inflammation  Extremities: No edema, cyanosis, clubbing  Vessels: Symmetric bilaterally  Neurologic: Alert and oriented; speech intact; face symmetrical; moves all extremities well; CNII-XII intact without focal deficit   Assessment:  1. Acute sinusitis, recurrence not specified, unspecified location     Plan:  Rx for Augmentin 875 mg bid x 7 days, Rx for Prednisone 20 mg every day x 5 days; increase fluids, rest and follow up worse, no better.   No follow-ups on file.  No orders of the defined types were placed in this encounter.   Requested Prescriptions   Signed Prescriptions Disp Refills   amoxicillin-clavulanate (AUGMENTIN) 875-125 MG tablet 14 tablet 0    Sig: Take 1 tablet by mouth 2 (two) times daily for 7 days.   predniSONE (DELTASONE) 20 MG tablet 5 tablet 0    Sig: Take 1 tablet (20 mg total) by mouth daily with breakfast.

## 2023-03-20 DIAGNOSIS — H1031 Unspecified acute conjunctivitis, right eye: Secondary | ICD-10-CM | POA: Diagnosis not present

## 2023-03-27 ENCOUNTER — Ambulatory Visit: Payer: BC Managed Care – PPO | Attending: General Surgery

## 2023-03-27 DIAGNOSIS — Z483 Aftercare following surgery for neoplasm: Secondary | ICD-10-CM | POA: Insufficient documentation

## 2023-03-27 NOTE — Therapy (Signed)
 OUTPATIENT PHYSICAL THERAPY SOZO SCREENING NOTE   Patient Name: Cheyenne Garner MRN: [id number] DOB:Jun 22, 1965, 58 y.o., female Today's Date: 03/27/2023  PCP: Bradd Canary, MD REFERRING PROVIDER: Griselda Miner, MD   PT End of Session - 03/27/23 1003     Visit Number 14   unchanged due to screen   PT Start Time 1003    PT Stop Time 1010    PT Time Calculation (min) 7 min    Activity Tolerance Patient tolerated treatment well    Behavior During Therapy [facility] for tasks assessed/performed             Past Medical History:  Diagnosis Date   Allergies    Anxiety    Asthma    cough variant asthma developed in last couple of years   Bipolar 1 disorder (HCC)    Breast cancer (HCC) 02/12/2020   left lumpectomy IDC w/radiation no chemo   Cervical cancer screening 09/12/2016   Colon polyp 09/12/2016   Family history of bladder cancer    Family history of breast cancer    Family history of ovarian cancer    Family history of prostate cancer    Hx of colonic polyp 09/12/2016   Colonoscopy 2017 with 1 small polyp, done by Dr Dulce Sellar, repeat colonoscopy in 5 years, 2022   Hypothyroidism    Personal history of radiation therapy    Preventative health care 07/23/2015   Thyroid disease    Past Surgical History:  Procedure Laterality Date   APPENDECTOMY     BREAST LUMPECTOMY WITH RADIOACTIVE SEED LOCALIZATION Bilateral 02/12/2020   Procedure: BILATERAL BREAST LUMPECTOMY WITH RADIOACTIVE SEED LOCALIZATION;  Surgeon: Griselda Miner, MD;  Location: [facility];  Service: General;  Laterality: Bilateral;   RE-EXCISION OF BREAST LUMPECTOMY Left 03/04/2020   Procedure: LEFT BREAST MARGIN REEXCISION;  Surgeon: Griselda Miner, MD;  Location: [facility];  Service: General;  Laterality: Left;   SENTINEL NODE BIOPSY Left 03/04/2020   Procedure: SENTINEL NODE BIOPSY;  Surgeon: Griselda Miner, MD;  Location: [facility];  Service: General;  Laterality:  Left;   SKIN BIOPSY     back moles removed (precancer?)   Patient Active Problem List   Diagnosis Date Noted   Chest pain 04/09/2022   History of breast cancer 04/09/2022   Thrombocytopenia (HCC) 04/09/2022   History of COVID-19 03/28/2022   Hyperglycemia 11/08/2020   Elevated sed rate 11/08/2020   Milia 07/14/2020   Malignant neoplasm of overlapping sites of left breast in female, estrogen receptor positive (HCC) 05/18/2020   Genetic testing 04/07/2020   Hypothyroidism    Anxiety    Allergies    FH: thoracic aortic aneurysm 03/23/2020   Family history of breast cancer    Family history of ovarian cancer    Family history of bladder cancer    Family history of prostate cancer    Breast cancer in female (HCC) 03/09/2020   Bilateral dry eyes 01/27/2019   Cervical cancer screening 09/12/2016   Hx of colonic polyp 09/12/2016   Colon polyp 09/12/2016   Preventative health care 07/23/2015   Asthma, chronic    Thyroid disease    Bipolar affective (HCC)    Urinary incontinence 12/25/2011    REFERRING DIAG: left breast cancer at risk for lymphedema  THERAPY DIAG: Aftercare following surgery for neoplasm  PERTINENT HISTORY: L breast cancer, s/p bilateral breast lumpectomies 02/12/20, R was benign and 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   PAIN:  Are you having pain? No  SOZO SCREENING: Patient was assessed today using the SOZO machine to determine the lymphedema index score. This was compared to her baseline score. It was determined that she is within the recommended range when compared to her baseline and no further action is needed at this time. She will continue SOZO screenings. These are done every 3 months for 2 years post operatively followed by  every 6 months for 2 years, and then annually.  Pt will begin 6 month screens now.    L-DEX FLOWSHEETS - 03/27/23 1000       L-DEX LYMPHEDEMA SCREENING   Measurement Type Unilateral    L-DEX MEASUREMENT EXTREMITY Upper Extremity    POSITION  Standing    DOMINANT SIDE Left    At Risk Side Left    BASELINE SCORE (UNILATERAL) -1.7    L-DEX SCORE (UNILATERAL) -3    VALUE CHANGE (UNILAT) -1.3               Kathryne Hitch Ashely Goosby, PT 03/27/2023, 10:15 AM

## 2023-04-01 ENCOUNTER — Other Ambulatory Visit (HOSPITAL_BASED_OUTPATIENT_CLINIC_OR_DEPARTMENT_OTHER): Payer: Self-pay

## 2023-04-02 ENCOUNTER — Encounter: Payer: BC Managed Care – PPO | Admitting: Family Medicine

## 2023-04-02 ENCOUNTER — Other Ambulatory Visit: Payer: Self-pay

## 2023-04-05 ENCOUNTER — Other Ambulatory Visit (HOSPITAL_BASED_OUTPATIENT_CLINIC_OR_DEPARTMENT_OTHER): Payer: Self-pay

## 2023-04-10 ENCOUNTER — Encounter: Payer: BC Managed Care – PPO | Admitting: Family Medicine

## 2023-04-10 ENCOUNTER — Other Ambulatory Visit (HOSPITAL_BASED_OUTPATIENT_CLINIC_OR_DEPARTMENT_OTHER): Payer: Self-pay

## 2023-04-10 MED ORDER — LITHIUM CARBONATE ER 300 MG PO TBCR
300.0000 mg | EXTENDED_RELEASE_TABLET | Freq: Two times a day (BID) | ORAL | 1 refills | Status: DC
  Filled 2023-04-10: qty 60, 30d supply, fill #0
  Filled 2023-05-09: qty 60, 30d supply, fill #1

## 2023-04-11 ENCOUNTER — Other Ambulatory Visit (HOSPITAL_BASED_OUTPATIENT_CLINIC_OR_DEPARTMENT_OTHER): Payer: Self-pay

## 2023-05-03 ENCOUNTER — Other Ambulatory Visit (HOSPITAL_BASED_OUTPATIENT_CLINIC_OR_DEPARTMENT_OTHER): Payer: Self-pay

## 2023-05-08 DIAGNOSIS — E039 Hypothyroidism, unspecified: Secondary | ICD-10-CM | POA: Diagnosis not present

## 2023-05-15 DIAGNOSIS — F3111 Bipolar disorder, current episode manic without psychotic features, mild: Secondary | ICD-10-CM | POA: Diagnosis not present

## 2023-05-29 ENCOUNTER — Ambulatory Visit
Admission: RE | Admit: 2023-05-29 | Discharge: 2023-05-29 | Disposition: A | Discharge status: Home / Self Care | Payer: BC Managed Care – PPO | Source: Ambulatory Visit | Attending: Hematology and Oncology

## 2023-05-29 ENCOUNTER — Ambulatory Visit: Payer: Self-pay | Admitting: *Deleted

## 2023-05-29 DIAGNOSIS — Z1239 Encounter for other screening for malignant neoplasm of breast: Secondary | ICD-10-CM | POA: Diagnosis not present

## 2023-05-29 DIAGNOSIS — C50011 Malignant neoplasm of nipple and areola, right female breast: Secondary | ICD-10-CM

## 2023-05-29 DIAGNOSIS — Z853 Personal history of malignant neoplasm of breast: Secondary | ICD-10-CM | POA: Diagnosis not present

## 2023-05-29 MED ORDER — GADOPICLENOL 0.5 MMOL/ML IV SOLN
7.0000 mL | Freq: Once | INTRAVENOUS | Status: AC | PRN
  Administered 2023-05-29: 7 mL via INTRAVENOUS

## 2023-06-12 NOTE — Assessment & Plan Note (Signed)
Is being followed by endorcrinology

## 2023-06-12 NOTE — Assessment & Plan Note (Signed)
 [id number] acceptable, minimize simple carbs. Increase exercise as tolerated.

## 2023-06-12 NOTE — Assessment & Plan Note (Signed)
 On NP Thyroid  but having trouble regulating symptoms and dosing so is considering switching to Levothyroxine, continue to monitor and willing to take over prescribing if patient decides to pursue switch to levothyroxine after speaking with her Integrative Medicine Provider

## 2023-06-12 NOTE — Assessment & Plan Note (Addendum)
 Patient encouraged to maintain heart healthy diet, regular exercise, adequate sleep. Consider daily probiotics. Take medications as prescribed. Labs reviewed. . Following with oncology for mgm last one 2024, Colonoscopy 2017 repeat in 5 years. Pap 2022 ASCUS referred to OB/GYN. Given and reviewed copy of ACP documents from [facility] Secretary of [place] and encouraged to complete and return  MM 11/2022 repeat every 1-2 years

## 2023-06-14 ENCOUNTER — Ambulatory Visit (INDEPENDENT_AMBULATORY_CARE_PROVIDER_SITE_OTHER): Payer: BC Managed Care – PPO | Admitting: Family Medicine

## 2023-06-14 ENCOUNTER — Encounter: Payer: Self-pay | Admitting: Family Medicine

## 2023-06-14 VITALS — BP 116/82 | HR 76 | Resp 16 | Ht 66.0 in | Wt 157.4 lb

## 2023-06-14 DIAGNOSIS — E079 Disorder of thyroid, unspecified: Secondary | ICD-10-CM

## 2023-06-14 DIAGNOSIS — Z0000 Encounter for general adult medical examination without abnormal findings: Secondary | ICD-10-CM

## 2023-06-14 DIAGNOSIS — E039 Hypothyroidism, unspecified: Secondary | ICD-10-CM

## 2023-06-14 DIAGNOSIS — Z170 Estrogen receptor positive status [ER+]: Secondary | ICD-10-CM

## 2023-06-14 DIAGNOSIS — K069 Disorder of gingiva and edentulous alveolar ridge, unspecified: Secondary | ICD-10-CM

## 2023-06-14 DIAGNOSIS — R739 Hyperglycemia, unspecified: Secondary | ICD-10-CM

## 2023-06-14 NOTE — Patient Instructions (Addendum)
 Hydrate 60-80 ounces of fluids daily and ideally 5-10 ounces every 1-2 hours  MIND or Mediterranean diet  6-8 hours of sleep  4000, 8000 steps  ACP documents to Dr B     Preventive Care 31-58 Years Old, Female Preventive care refers to lifestyle choices and visits with your health care provider that can promote health and wellness. Preventive care visits are also called wellness exams. What can I expect for my preventive care visit? Counseling Your health care provider may ask you questions about your: Medical history, including: Past medical problems. Family medical history. Pregnancy history. Current health, including: Menstrual cycle. Method of birth control. Emotional well-being. Home life and relationship well-being. Sexual activity and sexual health. Lifestyle, including: Alcohol, nicotine or tobacco, and drug use. Access to firearms. Diet, exercise, and sleep habits. Work and work Astronomer. Sunscreen use. Safety issues such as seatbelt and bike helmet use. Physical exam Your health care provider will check your: Height and weight. These may be used to calculate your BMI (body mass index). BMI is a measurement that tells if you are at a healthy weight. Waist circumference. This measures the distance around your waistline. This measurement also tells if you are at a healthy weight and may help predict your risk of certain diseases, such as type 2 diabetes and high blood pressure. Heart rate and blood pressure. Body temperature. Skin for abnormal spots. What immunizations do I need?  Vaccines are usually given at various ages, according to a schedule. Your health care provider will recommend vaccines for you based on your age, medical history, and lifestyle or other factors, such as travel or where you work. What tests do I need? Screening Your health care provider may recommend screening tests for certain conditions. This may include: Lipid and cholesterol  levels. Diabetes screening. This is done by checking your blood sugar (glucose) after you have not eaten for a while (fasting). Pelvic exam and Pap test. Hepatitis B test. Hepatitis C test. HIV (human immunodeficiency virus) test. STI (sexually transmitted infection) testing, if you are at risk. Lung cancer screening. Colorectal cancer screening. Mammogram. Talk with your health care provider about when you should start having regular mammograms. This may depend on whether you have a family history of breast cancer. BRCA-related cancer screening. This may be done if you have a family history of breast, ovarian, tubal, or peritoneal cancers. Bone density scan. This is done to screen for osteoporosis. Talk with your health care provider about your test results, treatment options, and if necessary, the need for more tests. Follow these instructions at home: Eating and drinking  Eat a diet that includes fresh fruits and vegetables, whole grains, lean protein, and low-fat dairy products. Take vitamin and mineral supplements as recommended by your health care provider. Do not drink alcohol if: Your health care provider tells you not to drink. You are pregnant, may be pregnant, or are planning to become pregnant. If you drink alcohol: Limit how much you have to 0-1 drink a day. Know how much alcohol is in your drink. In the [place] one drink equals one 12 oz bottle of beer (355 mL), one 5 oz glass of wine (148 mL), or one 1 oz glass of hard liquor (44 mL). Lifestyle Brush your teeth every morning and night with fluoride toothpaste. Floss one time each day. Exercise for at least 30 minutes 5 or more days each week. Do not use any products that contain nicotine or tobacco. These products include cigarettes, chewing tobacco,  and vaping devices, such as e-cigarettes. If you need help quitting, ask your health care provider. Do not use drugs. If you are sexually active, practice safe sex. Use a  condom or other form of protection to prevent STIs. If you do not wish to become pregnant, use a form of birth control. If you plan to become pregnant, see your health care provider for a prepregnancy visit. Take aspirin only as told by your health care provider. Make sure that you understand how much to take and what form to take. Work with your health care provider to find out whether it is safe and beneficial for you to take aspirin daily. Find healthy ways to manage stress, such as: Meditation, yoga, or listening to music. Journaling. Talking to a trusted person. Spending time with friends and family. Minimize exposure to UV radiation to reduce your risk of skin cancer. Safety Always wear your seat belt while driving or riding in a vehicle. Do not drive: If you have been drinking alcohol. Do not ride with someone who has been drinking. When you are tired or distracted. While texting. If you have been using any mind-altering substances or drugs. Wear a helmet and other protective equipment during sports activities. If you have firearms in your house, make sure you follow all gun safety procedures. Seek help if you have been physically or sexually abused. What's next? Visit your health care provider once a year for an annual wellness visit. Ask your health care provider how often you should have your eyes and teeth checked. Stay up to date on all vaccines. This information is not intended to replace advice given to you by your health care provider. Make sure you discuss any questions you have with your health care provider. Document Revised: 06/30/2020 Document Reviewed: 06/30/2020 Elsevier Patient Education  2024 [employer].

## 2023-06-15 ENCOUNTER — Encounter: Payer: Self-pay | Admitting: Family Medicine

## 2023-06-15 DIAGNOSIS — K069 Disorder of gingiva and edentulous alveolar ridge, unspecified: Secondary | ICD-10-CM | POA: Insufficient documentation

## 2023-06-15 NOTE — Assessment & Plan Note (Signed)
 She has noted more concerns on left side with possible lymphedema in left leg and increased dental concerns on left side since her radiation. Continues to be able to manage symptoms.

## 2023-06-15 NOTE — Progress Notes (Signed)
 Subjective:    Patient ID: Cheyenne Garner, female    DOB: 09/18/1965, 58 y.o.   MRN: [id number]  Chief Complaint  Patient presents with   Annual Exam    Patient presents today for a physical exam.   Quality Metric Gaps    zoster    HPI Discussed the use of AI scribe software for clinical note transcription with the patient, who gave verbal consent to proceed.  History of Present Illness Cheyenne Garner is a 58 year old female who presents with severe tooth pain.  She experiences severe pain in the left upper tooth area, which led to an urgent dental visit. Initially, a root canal was suspected, but the tooth appeared healthy with no fillings. She was referred to an endodontist for further evaluation.  At the endodontist, additional testing, including an x-ray and sensitivity tests, revealed significant bone loss and possibly an infection, despite the x-ray appearing normal. Advanced gum recession was noted, potentially allowing infection at the root. She was then referred to a periodontist.  The periodontist determined that the affected tooth needed extraction due to gum recession. She manages the pain with Tylenol  and ibuprofen every four hours and has started antibiotics. Typically, she does not take such medications.  She expresses concern that her dental issues may be related to past radiation treatment, affecting saliva production and contributing to her condition. She reports a noticeable size difference in her legs, with the left leg being larger, which she attributes to radiation affecting her lymphatic system. She experiences tightness in clothing on the left side, believing this is related to past cancer treatment.  She has a history of thyroid  issues, managed with NP thyroid  medication, but is dissatisfied with its regulation. She is considering switching to levothyroxine due to cost and difficulty achieving a stable dose. Fluctuations in TSH levels have led to fatigue and weight  gain.  She mentions a family history of cancer, with her daughter having had stage four head and neck cancer, requiring extensive treatment including radiation and surgery. Her daughter now experiences ongoing issues with saliva production and lymphatic swelling.    Past Medical History:  Diagnosis Date   Allergies    Anxiety    Asthma    cough variant asthma developed in last couple of years   Bipolar 1 disorder (HCC)    Breast cancer (HCC) 02/12/2020   left lumpectomy IDC w/radiation no chemo   Cervical cancer screening 09/12/2016   Colon polyp 09/12/2016   Family history of bladder cancer    Family history of breast cancer    Family history of ovarian cancer    Family history of prostate cancer    Hx of colonic polyp 09/12/2016   Colonoscopy 2017 with 1 small polyp, done by Dr Kimble Pennant, repeat colonoscopy in 5 years, 2022   Hypothyroidism    Personal history of radiation therapy    Preventative health care 07/23/2015   Thyroid  disease     Past Surgical History:  Procedure Laterality Date   APPENDECTOMY     BREAST LUMPECTOMY WITH RADIOACTIVE SEED LOCALIZATION Bilateral 02/12/2020   Procedure: BILATERAL BREAST LUMPECTOMY WITH RADIOACTIVE SEED LOCALIZATION;  Surgeon: Caralyn Chandler, MD;  Location: [facility];  Service: General;  Laterality: Bilateral;   RE-EXCISION OF BREAST LUMPECTOMY Left 03/04/2020   Procedure: LEFT BREAST MARGIN REEXCISION;  Surgeon: Caralyn Chandler, MD;  Location: [facility];  Service: General;  Laterality: Left;   SENTINEL NODE BIOPSY Left  03/04/2020   Procedure: SENTINEL NODE BIOPSY;  Surgeon: Caralyn Chandler, MD;  Location: [facility];  Service: General;  Laterality: Left;   SKIN BIOPSY     back moles removed (precancer?)    Family History  Problem Relation Age of Onset   Arthritis Mother    Dementia Mother        alzheimers   Cancer Father        bladder   Hypertension Father    Heart disease Father         MI, s/p triple bypass at age 17   Neuropathy Father        toxic peripheral    Other Father        peripheral neuropathy   Cholelithiasis Sister    Aortic aneurysm Sister 33   Aneurysm Sister        descending aortic    Mental retardation Sister        ADD, schizoaffective d/o   Obesity Sister    Depression Brother    Arthritis Brother        back disease   Prostate cancer Paternal Uncle        spread to bladder, d. 60s/70s   Dementia Maternal Grandmother    Osteoarthritis Maternal Grandmother    Macular degeneration Maternal Grandfather    Osteoarthritis Maternal Grandfather    Congestive Heart Failure Paternal Grandmother    Cancer Paternal Grandfather        young, lung cancer?, heavy smoker   Ovarian cancer Cousin 2       maternal second cousin (MGF's great-niece)   Breast cancer Cousin 61       paternal first cousin   Cancer Niece 11       soft tissue sarcoma    Social History   Socioeconomic History   Marital status: Married    Spouse name: Not on file   Number of children: Not on file   Years of education: Not on file   Highest education level: Associate degree: academic program  Occupational History   Not on file  Tobacco Use   Smoking status: Never   Smokeless tobacco: Never  Substance and Sexual Activity   Alcohol use: Yes    Comment: social   Drug use: No   Sexual activity: Yes    Birth control/protection: None, Post-menopausal  Other Topics Concern   Not on file  Social History Narrative   Lives with husband, 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 No major dietary restrictions   Social Drivers of Corporate investment banker Strain: Low Risk  (03/13/2023)   Overall Financial Resource Strain (CARDIA)    Difficulty of Paying Living Expenses: Not hard at all  Food Insecurity: No Food Insecurity (03/13/2023)   Hunger Vital Sign    Worried About Running Out of Food in the Last Year: Never true    Ran Out of Food in the Last Year: Never  true  Transportation Needs: No Transportation Needs (03/13/2023)   PRAPARE - Administrator, Civil Service (Medical): No    Lack of Transportation (Non-Medical): No  Physical Activity: Sufficiently Active (03/13/2023)   Exercise Vital Sign    Days of Exercise per Week: 6 days    Minutes of Exercise per Session: 40 min  Stress: No Stress Concern Present (03/13/2023)   [employer] of Occupational Health - Occupational Stress Questionnaire    Feeling of Stress : Only a little  Social Connections: Socially Integrated (03/13/2023)   Social Connection and Isolation Panel [NHANES]    Frequency of Communication with Friends and Family: Three times a week    Frequency of Social Gatherings with Friends and Family: Once a week    Attends Religious Services: More than 4 times per year    Active Member of [employer] or Organizations: Yes    Attends Engineer, structural: More than 4 times per year    Marital Status: Married  Catering manager Violence: Not At Risk (04/09/2022)   Humiliation, Afraid, Rape, and Kick questionnaire    Fear of Current or Ex-Partner: No    Emotionally Abused: No    Physically Abused: No    Sexually Abused: No    Outpatient Medications Prior to Visit  Medication Sig Dispense Refill   albuterol  (VENTOLIN  HFA) 108 (90 Base) MCG/ACT inhaler Inhale 2 puffs into the lungs every 6 (six) hours as needed for wheezing or shortness of breath. 6.7 g 2   ASHWAGANDHA PO Take 2 tablets by mouth as needed (anxiety).     CALCIUM PO Take 2 tablets by mouth in the morning, at noon, and at bedtime. Bone Up     Carboxymethylcellulose Sodium (EYE DROPS OP) Place 1 drop into both eyes in the morning and at bedtime. Systemic Balance     Cyanocobalamin  (VITAMIN B-12 PO) Take 1 capsule by mouth in the morning and at bedtime.     lithium  carbonate (LITHOBID ) 300 MG ER tablet Take 1 tablet (300 mg total) by mouth 2 (two) times daily. 60 tablet 5   Probiotic Product (PROBIOTIC-10  PO) Take 1 capsule by mouth at bedtime.     thyroid  (NP THYROID ) 120 MG tablet Take 1 tablet (120 mg total) by mouth daily. 90 tablet 3   VITAMIN D PO Take 5,000 Units by mouth daily.     Zinc 50 MG TABS Take 50 mg by mouth daily.     lithium  carbonate (LITHOBID ) 300 MG ER tablet Take 1 tablet (300 mg total) by mouth 2 (two) times daily. 60 tablet 1   predniSONE  (DELTASONE ) 20 MG tablet Take 1 tablet (20 mg total) by mouth daily with breakfast. 5 tablet 0   No facility-administered medications prior to visit.    Allergies  Allergen Reactions   Gentamycin [Gentamicin] Other (See Comments)    Reacted to eye ointment, swelling red painful eyes    Review of Systems  Constitutional:  Positive for malaise/fatigue. Negative for chills and fever.  HENT:  Negative for congestion and hearing loss.   Eyes:  Negative for discharge.  Respiratory:  Negative for cough, sputum production and shortness of breath.   Cardiovascular:  Negative for chest pain, palpitations and leg swelling.  Gastrointestinal:  Negative for abdominal pain, blood in stool, constipation, diarrhea, heartburn, nausea and vomiting.  Genitourinary:  Negative for dysuria, frequency, hematuria and urgency.  Musculoskeletal:  Negative for back pain, falls and myalgias.  Skin:  Negative for rash.  Neurological:  Negative for dizziness, sensory change, loss of consciousness, weakness and headaches.  Endo/Heme/Allergies:  Negative for environmental allergies. Does not bruise/bleed easily.  Psychiatric/Behavioral:  Negative for depression and suicidal ideas. The patient is not nervous/anxious and does not have insomnia.        Objective:     Physical Exam Constitutional:      General: She is not in acute distress.    Appearance: Normal appearance. She is not diaphoretic.  HENT:     Head: Normocephalic  and atraumatic.     Right Ear: Tympanic membrane, ear canal and external ear normal.     Left Ear: Tympanic membrane, ear  canal and external ear normal.     Nose: Nose normal.     Mouth/Throat:     Mouth: Mucous membranes are moist.     Pharynx: Oropharynx is clear. No oropharyngeal exudate.  Eyes:     General: No scleral icterus.       Right eye: No discharge.        Left eye: No discharge.     Conjunctiva/sclera: Conjunctivae normal.     Pupils: Pupils are equal, round, and reactive to light.  Neck:     Thyroid : No thyromegaly.  Cardiovascular:     Rate and Rhythm: Normal rate and regular rhythm.     Heart sounds: Normal heart sounds. No murmur heard. Pulmonary:     Effort: Pulmonary effort is normal. No respiratory distress.     Breath sounds: Normal breath sounds. No wheezing or rales.  Abdominal:     General: Bowel sounds are normal. There is no distension.     Palpations: Abdomen is soft. There is no mass.     Tenderness: There is no abdominal tenderness.  Musculoskeletal:        General: No tenderness. Normal range of motion.     Cervical back: Normal range of motion and neck supple.  Lymphadenopathy:     Cervical: No cervical adenopathy.  Skin:    General: Skin is warm and dry.     Findings: No rash.  Neurological:     General: No focal deficit present.     Mental Status: She is alert and oriented to person, place, and time.     Cranial Nerves: No cranial nerve deficit.     Coordination: Coordination normal.     Deep Tendon Reflexes: Reflexes are normal and symmetric. Reflexes normal.  Psychiatric:        Mood and Affect: Mood normal.        Behavior: Behavior normal.        Thought Content: Thought content normal.        Judgment: Judgment normal.     BP 116/82   Pulse 76   Resp 16   Ht 5\' 6"  (1.676 m)   Wt 157 lb 6.4 oz (71.4 kg)   SpO2 98%   BMI 25.41 kg/m  Wt Readings from Last 3 Encounters:  06/14/23 157 lb 6.4 oz (71.4 kg)  03/13/23 158 lb (71.7 kg)  11/29/22 159 lb 14.4 oz (72.5 kg)    Diabetic Foot Exam - Simple   No data filed    Lab Results  Component  Value Date   WBC 4.8 09/28/2022   HGB 14.5 09/28/2022   HCT 44.5 09/28/2022   PLT 166.0 09/28/2022   GLUCOSE 88 09/28/2022   CHOL 179 09/28/2022   TRIG 103.0 09/28/2022   HDL 76.80 09/28/2022   LDLCALC 82 09/28/2022   ALT 12 09/28/2022   AST 15 09/28/2022   NA 140 09/28/2022   K 4.6 09/28/2022   CL 102 09/28/2022   CREATININE 0.93 09/28/2022   BUN 17 09/28/2022   CO2 29 09/28/2022   TSH 2.99 09/28/2022   HGBA1C 5.3 09/28/2022    Lab Results  Component Value Date   TSH 2.99 09/28/2022   Lab Results  Component Value Date   WBC 4.8 09/28/2022   HGB 14.5 09/28/2022   HCT 44.5 09/28/2022   MCV 92.3  09/28/2022   PLT 166.0 09/28/2022   Lab Results  Component Value Date   NA 140 09/28/2022   K 4.6 09/28/2022   CO2 29 09/28/2022   GLUCOSE 88 09/28/2022   BUN 17 09/28/2022   CREATININE 0.93 09/28/2022   BILITOT 0.7 09/28/2022   ALKPHOS 64 09/28/2022   AST 15 09/28/2022   ALT 12 09/28/2022   PROT 7.1 09/28/2022   ALBUMIN 4.2 09/28/2022   CALCIUM 9.6 09/28/2022   ANIONGAP 5 05/29/2022   EGFR 73 04/21/2020   GFR 68.18 09/28/2022   Lab Results  Component Value Date   CHOL 179 09/28/2022   Lab Results  Component Value Date   HDL 76.80 09/28/2022   Lab Results  Component Value Date   LDLCALC 82 09/28/2022   Lab Results  Component Value Date   TRIG 103.0 09/28/2022   Lab Results  Component Value Date   CHOLHDL 2 09/28/2022   Lab Results  Component Value Date   HGBA1C 5.3 09/28/2022       Assessment & Plan:  Hyperglycemia Assessment & Plan: hgba1c acceptable, minimize simple carbs. Increase exercise as tolerated.    Orders: -     Comprehensive metabolic panel with GFR -     CBC with Differential/Platelet -     Hemoglobin A1c -     Lipid panel  Hypothyroidism, unspecified type Assessment & Plan: On NP Thyroid  but having trouble regulating symptoms and dosing so is considering switching to Levothyroxine, continue to monitor and willing to take  over prescribing if patient decides to pursue switch to levothyroxine after speaking with her Integrative Medicine Provider   Orders: -     Comprehensive metabolic panel with GFR -     CBC with Differential/Platelet -     TSH  Thyroid  disease Assessment & Plan: Is being followed by endorcrinology    Preventative health care Assessment & Plan: Patient encouraged to maintain heart healthy diet, regular exercise, adequate sleep. Consider daily probiotics. Take medications as prescribed. Labs reviewed. . Following with oncology for mgm last one 2024, Colonoscopy 2017 repeat in 5 years. Pap 2022 ASCUS referred to OB/GYN. Given and reviewed copy of ACP documents from [facility] Secretary of State and encouraged to complete and return  MM 11/2022 repeat every 1-2 years    Malignant neoplasm of overlapping sites of left breast in female, estrogen receptor positive (HCC) Assessment & Plan: She has noted more concerns on left side with possible lymphedema in left leg and increased dental concerns on left side since her radiation. Continues to be able to manage symptoms.    Gingiva disorder Assessment & Plan: She has significant recession and is preparing to have her gums grafted. Had an episode of severe pain in a tooth in upper left, posteriorly which required removal     Assessment and Plan Assessment & Plan Thyroid  dysfunction Inadequate symptom relief with current NP thyroid  120 mg. Considering levothyroxine for better regulation and cost-effectiveness. - Order TSH test to assess current thyroid  function. - Discuss potential switch to levothyroxine. - Review conversion table for NP thyroid  to levothyroxine transition. - Coordinate with Dr. Helane Lloyd regarding thyroid  management and potential switch to levothyroxine.  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 Tooth extraction recommended due to infection and bone loss. She declined  implant due to cost and lack of aesthetic impact. - Follow up with periodontist for post-op care and management of gum recession. - Plan for gum recession  treatment to prevent further tooth loss.  Lymphedema Lymphedema in the left leg, possibly related to previous radiation therapy. Asymmetry in leg size noted.  Pharyngeal spasms Intermittent pharyngeal spasms on the left side, possibly related to radiation therapy. Symptoms include transient inability to breathe or swallow but symptoms last for only a couple seconds. She will report if symptoms worsen - Monitor frequency and severity of pharyngeal spasms. - Consider referral to speech therapy for evaluation if symptoms persist or worsen.      Randie Bustle, MD

## 2023-06-15 NOTE — Assessment & Plan Note (Signed)
 She has significant recession and is preparing to have her gums grafted. Had an episode of severe pain in a tooth in upper left, posteriorly which required removal

## 2023-06-27 ENCOUNTER — Other Ambulatory Visit (HOSPITAL_BASED_OUTPATIENT_CLINIC_OR_DEPARTMENT_OTHER): Payer: Self-pay

## 2023-06-27 MED ORDER — THYROID 90 MG PO TABS
90.0000 mg | ORAL_TABLET | Freq: Every morning | ORAL | 3 refills | Status: DC
  Filled 2023-06-27: qty 45, 90d supply, fill #0

## 2023-06-28 ENCOUNTER — Other Ambulatory Visit (HOSPITAL_COMMUNITY): Payer: Self-pay

## 2023-06-28 ENCOUNTER — Other Ambulatory Visit (HOSPITAL_BASED_OUTPATIENT_CLINIC_OR_DEPARTMENT_OTHER): Payer: Self-pay

## 2023-06-29 ENCOUNTER — Other Ambulatory Visit (HOSPITAL_BASED_OUTPATIENT_CLINIC_OR_DEPARTMENT_OTHER): Payer: Self-pay

## 2023-07-05 DIAGNOSIS — E063 Autoimmune thyroiditis: Secondary | ICD-10-CM | POA: Diagnosis not present

## 2023-07-05 DIAGNOSIS — D8989 Other specified disorders involving the immune mechanism, not elsewhere classified: Secondary | ICD-10-CM | POA: Diagnosis not present

## 2023-07-05 DIAGNOSIS — E559 Vitamin D deficiency, unspecified: Secondary | ICD-10-CM | POA: Diagnosis not present

## 2023-07-05 DIAGNOSIS — E7212 Methylenetetrahydrofolate reductase deficiency: Secondary | ICD-10-CM | POA: Diagnosis not present

## 2023-08-03 ENCOUNTER — Encounter: Payer: Self-pay | Admitting: Advanced Practice Midwife

## 2023-08-29 DIAGNOSIS — E039 Hypothyroidism, unspecified: Secondary | ICD-10-CM | POA: Diagnosis not present

## 2023-08-29 DIAGNOSIS — Z13220 Encounter for screening for lipoid disorders: Secondary | ICD-10-CM | POA: Diagnosis not present

## 2023-08-29 DIAGNOSIS — E559 Vitamin D deficiency, unspecified: Secondary | ICD-10-CM | POA: Diagnosis not present

## 2023-08-29 DIAGNOSIS — R5383 Other fatigue: Secondary | ICD-10-CM | POA: Diagnosis not present

## 2023-09-24 ENCOUNTER — Ambulatory Visit

## 2023-10-09 DIAGNOSIS — Z01419 Encounter for gynecological examination (general) (routine) without abnormal findings: Secondary | ICD-10-CM | POA: Diagnosis not present

## 2023-10-09 DIAGNOSIS — Z130 Encounter for screening for diseases of the blood and blood-forming organs and certain disorders involving the immune mechanism: Secondary | ICD-10-CM | POA: Diagnosis not present

## 2023-10-11 DIAGNOSIS — E063 Autoimmune thyroiditis: Secondary | ICD-10-CM | POA: Diagnosis not present

## 2023-10-11 DIAGNOSIS — E559 Vitamin D deficiency, unspecified: Secondary | ICD-10-CM | POA: Diagnosis not present

## 2023-10-11 DIAGNOSIS — E7212 Methylenetetrahydrofolate reductase deficiency: Secondary | ICD-10-CM | POA: Diagnosis not present

## 2023-10-11 DIAGNOSIS — D8989 Other specified disorders involving the immune mechanism, not elsewhere classified: Secondary | ICD-10-CM | POA: Diagnosis not present

## 2023-10-17 DIAGNOSIS — Z5181 Encounter for therapeutic drug level monitoring: Secondary | ICD-10-CM | POA: Diagnosis not present

## 2023-10-23 ENCOUNTER — Ambulatory Visit: Attending: General Surgery

## 2023-10-23 VITALS — Wt 153.1 lb

## 2023-10-23 DIAGNOSIS — Z483 Aftercare following surgery for neoplasm: Secondary | ICD-10-CM | POA: Insufficient documentation

## 2023-10-23 NOTE — Therapy (Signed)
 OUTPATIENT PHYSICAL THERAPY SOZO SCREENING NOTE   Patient Name: Cheyenne Garner MRN: [id number] DOB:1965-05-16, 58 y.o., female Today's Date: 10/23/2023  PCP: Domenica Harlene LABOR, MD REFERRING PROVIDER: Curvin Deward MOULD, MD   PT End of Session - 10/23/23 0801     Visit Number 14   # unchanged due to screen only   PT Start Time 0759    PT Stop Time 0803    PT Time Calculation (min) 4 min    Activity Tolerance Patient tolerated treatment well    Behavior During Therapy [facility] for tasks assessed/performed          Past Medical History:  Diagnosis Date   Allergies    Anxiety    Asthma    cough variant asthma developed in last couple of years   Bipolar 1 disorder (HCC)    Breast cancer (HCC) 02/12/2020   left lumpectomy IDC w/radiation no chemo   Cervical cancer screening 09/12/2016   Colon polyp 09/12/2016   Family history of bladder cancer    Family history of breast cancer    Family history of ovarian cancer    Family history of prostate cancer    Hx of colonic polyp 09/12/2016   Colonoscopy 2017 with 1 small polyp, done by Dr Burnette, repeat colonoscopy in 5 years, 2022   Hypothyroidism    Personal history of radiation therapy    Preventative health care 07/23/2015   Thyroid  disease    Past Surgical History:  Procedure Laterality Date   APPENDECTOMY     BREAST LUMPECTOMY WITH RADIOACTIVE SEED LOCALIZATION Bilateral 02/12/2020   Procedure: BILATERAL BREAST LUMPECTOMY WITH RADIOACTIVE SEED LOCALIZATION;  Surgeon: Curvin Deward MOULD, MD;  Location: [facility];  Service: General;  Laterality: Bilateral;   RE-EXCISION OF BREAST LUMPECTOMY Left 03/04/2020   Procedure: LEFT BREAST MARGIN REEXCISION;  Surgeon: Curvin Deward MOULD, MD;  Location: [facility];  Service: General;  Laterality: Left;   SENTINEL NODE BIOPSY Left 03/04/2020   Procedure: SENTINEL NODE BIOPSY;  Surgeon: Curvin Deward MOULD, MD;  Location: [facility];  Service: General;  Laterality:  Left;   SKIN BIOPSY     back moles removed (precancer?)   Patient Active Problem List   Diagnosis Date Noted   Gingiva disorder 06/15/2023   Chest pain 04/09/2022   History of breast cancer 04/09/2022   Thrombocytopenia 04/09/2022   History of COVID-19 03/28/2022   Hyperglycemia 11/08/2020   Elevated sed rate 11/08/2020   Malignant neoplasm of overlapping sites of left breast in female, estrogen receptor positive (HCC) 05/18/2020   Genetic testing 04/07/2020   Hypothyroidism    Anxiety    Allergies    FH: thoracic aortic aneurysm 03/23/2020   Family history of breast cancer    Family history of ovarian cancer    Family history of bladder cancer    Family history of prostate cancer    Breast cancer in female (HCC) 03/09/2020   Bilateral dry eyes 01/27/2019   Cervical cancer screening 09/12/2016   Hx of colonic polyp 09/12/2016   Colon polyp 09/12/2016   Preventative health care 07/23/2015   Asthma, chronic    Thyroid  disease    Bipolar affective (HCC)    Urinary incontinence 12/25/2011    REFERRING DIAG: left breast cancer at risk for lymphedema  THERAPY DIAG: Aftercare following surgery for neoplasm  PERTINENT HISTORY: L breast cancer, s/p bilateral breast lumpectomies 02/12/20, R was benign and 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   PAIN:  Are you having pain? No  SOZO SCREENING: Patient was assessed today using the SOZO machine to determine the lymphedema index score. This was compared to her baseline score. It was determined that she is within the recommended range when compared to her baseline and no further action is needed at this time. She will continue SOZO screenings. These are done every 3 months for 2 years post operatively followed by  every 6 months for 2 years, and then annually.     L-DEX FLOWSHEETS - 10/23/23 0800       L-DEX LYMPHEDEMA SCREENING   Measurement Type Unilateral    L-DEX MEASUREMENT EXTREMITY Upper Extremity    POSITION  Standing    DOMINANT SIDE Left    At Risk Side Left    BASELINE SCORE (UNILATERAL) -1.7    L-DEX SCORE (UNILATERAL) -1.7    VALUE CHANGE (UNILAT) 0         P: Pt will cont 6 month screens Jan 2026 then can transition to annual.     Aden Berwyn Caldron, PTA 10/23/2023, 8:03 AM

## 2023-10-30 DIAGNOSIS — H2513 Age-related nuclear cataract, bilateral: Secondary | ICD-10-CM | POA: Diagnosis not present

## 2023-10-30 DIAGNOSIS — H16223 Keratoconjunctivitis sicca, not specified as Sjogren's, bilateral: Secondary | ICD-10-CM | POA: Diagnosis not present

## 2023-11-06 DIAGNOSIS — F3111 Bipolar disorder, current episode manic without psychotic features, mild: Secondary | ICD-10-CM | POA: Diagnosis not present

## 2023-11-14 ENCOUNTER — Other Ambulatory Visit: Payer: Self-pay | Admitting: Hematology and Oncology

## 2023-11-14 DIAGNOSIS — Z853 Personal history of malignant neoplasm of breast: Secondary | ICD-10-CM

## 2023-11-22 ENCOUNTER — Ambulatory Visit
Admission: RE | Admit: 2023-11-22 | Discharge: 2023-11-22 | Disposition: A | Discharge status: Home / Self Care | Source: Ambulatory Visit | Attending: Hematology and Oncology | Admitting: Hematology and Oncology

## 2023-11-22 DIAGNOSIS — R928 Other abnormal and inconclusive findings on diagnostic imaging of breast: Secondary | ICD-10-CM | POA: Diagnosis not present

## 2023-11-22 DIAGNOSIS — Z853 Personal history of malignant neoplasm of breast: Secondary | ICD-10-CM

## 2023-11-30 ENCOUNTER — Inpatient Hospital Stay: Payer: BC Managed Care – PPO | Attending: Hematology and Oncology | Admitting: Hematology and Oncology

## 2023-11-30 VITALS — BP 118/84 | HR 64 | Temp 98.2°F | Resp 18 | Ht 66.0 in | Wt 155.0 lb

## 2023-11-30 DIAGNOSIS — R635 Abnormal weight gain: Secondary | ICD-10-CM | POA: Diagnosis not present

## 2023-11-30 DIAGNOSIS — Z853 Personal history of malignant neoplasm of breast: Secondary | ICD-10-CM | POA: Insufficient documentation

## 2023-11-30 DIAGNOSIS — E063 Autoimmune thyroiditis: Secondary | ICD-10-CM | POA: Diagnosis not present

## 2023-11-30 DIAGNOSIS — Z923 Personal history of irradiation: Secondary | ICD-10-CM | POA: Insufficient documentation

## 2023-11-30 DIAGNOSIS — Z170 Estrogen receptor positive status [ER+]: Secondary | ICD-10-CM

## 2023-11-30 DIAGNOSIS — C50812 Malignant neoplasm of overlapping sites of left female breast: Secondary | ICD-10-CM | POA: Diagnosis not present

## 2023-11-30 DIAGNOSIS — C50011 Malignant neoplasm of nipple and areola, right female breast: Secondary | ICD-10-CM

## 2023-11-30 NOTE — Progress Notes (Signed)
 [facility]  [phone] [phone]     ID: ISRAELLA HUBERT DOB: 1965/10/26  MR#: [id number]  RDW#:[id number]  Patient Care Team: Domenica Harlene LABOR, MD as PCP - General (Family Medicine) Sheena Pugh, DO as PCP - Cardiology (Cardiology) Curvin Deward MOULD, MD as Consulting Physician (General Surgery) Dewey Rush, MD as Consulting Physician (Radiation Oncology) Burnette Fallow, MD as Consulting Physician (Gastroenterology) Haverstock, Tawni LITTIE, MD as Referring Physician (Dermatology) Tyree Nanetta SAILOR, RN as Oncology Nurse Navigator Estelle Service, MD as Consulting Physician (Obstetrics and Gynecology) Orlie Norris, MD as Referring Physician (Family Medicine) Amber Stalls, MD  CHIEF COMPLAINT: Estrogen receptor positive breast cancer  CURRENT TREATMENT:  None  INTERVAL HISTORY:  Discussed the use of AI scribe software for clinical note transcription with the patient, who gave verbal consent to proceed.  History of Present Illness Cheyenne Garner is a 58 year old female with breast cancer who presents for follow-up regarding her mammogram results   She recently underwent a mammogram which showed favorable results. A new radiologist discussed the benefits of continuing with mammograms due to low radiation exposure and advised against MRIs due to concerns about the contrast used. She is considering contrast-enhanced mammograms (CEM) as an alternative and plans to alternate between regular mammograms and CEMs annually. Her breast density has decreased from a D to a C.  She has a history of hypothyroidism and Hashimoto's thyroiditis. She experienced a significant increase in her TSH levels, reaching as high as 10, which led to a change in her thyroid  medication. She switched from NP thyroid  to Armour thyroid , which has stabilized her TSH to around 4. She will have her levels checked again in April.  She maintains an active lifestyle, engaging in  regular exercise including kickboxing and strength training. She feels less fatigued and has noticed improvements in her muscle mass and overall fitness. She has experienced weight fluctuations and is currently focused on maintaining her muscle mass while managing her weight. She has difficulty losing weight despite her active lifestyle, attributing some of the challenges to menopause.   Rest of the pertinent 10 point ROS reviewed and neg.  REVIEW OF SYSTEMS:  COVID 19 VACCINATION STATUS: Pfizer x1, most recently 01/2019   HISTORY OF CURRENT ILLNESS: From the original intake note:  GERILYN Garner was initially evaluated in 05/2017 for a right nipple/breast mass. Diagnostic mammography showed no evidence of malignancy, and she was subsequently referred to Dr. Curvin for further evaluation. She elected for follow up of the lesion. She was then seen by dermatology, who drained the lesion. The lesion resolved but began to reoccur 6 months later. She underwent bilateral diagnostic mammography with tomography and right breast ultrasonography at [facility] on 10/21/2019 showing: breast density category C; dilated duct and nipple discharge from right breast; no intraductal mass or mammographic abnormality identified; right axilla negative for adenopathy.  She underwent breast MRI on 11/11/2019 showing: breast composition C; 1.8 cm linear clumped non-mass enhancement within lower right breast, 6 o'clock; 8 mm irregular enhancing mass within upper-outer left breast; benign-appearing 1.5 cm nonenhancing fluid-intensity mass/collection within right nipple with probable subareolar extension, corresponding to clinical area of concern.  Accordingly on 12/04/2019 she proceeded to biopsy of the bilateral breast areas in question. The pathology from this procedure ([id number]) showed:  1. Right Breast  - fibrocystic changes  - focal atypical lobular hyperplasia  --concordant   2. Left Breast  - benign breast  tissue --discordant  As  noted, the right breast biopsy was found to be concordant, but the left breast biopsy was discordant. She proceeded to bilateral lumpectomies on 02/12/2020 under Dr. Curvin. Pathology from the procedure ([id number]) showed: 1. Right Breast  - fibrocystic change  - columnar cell change  - usual ductal hyperplasia  - intraductal papilloma  - associated microcalcifications 2. 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 Proliferation marker Ki67 at 2%. HER2 equivocal by immunohistochemistry (2+), but negative by fluorescent in situ hybridization with a signals ratio 1.19 and number per cell 1.73.  She underwent re-excision of the positive margin, as well as left sentinel lymph node biopsy, on 03/04/2020. Pathology [id number]) showed: no residual carcinoma; benign lymph nodes (0/3).   Cancer Staging  Breast cancer in female [facility]) Staging form: Breast, AJCC 8th Edition - Pathologic stage from 03/09/2020: Stage IA (pT1a, pN0, cM0, G2, ER+, PR+, HER2-) - Signed by Layla Sandria BROCKS, MD on 03/16/2020 Stage prefix: Initial diagnosis Multigene prognostic tests performed: None Histologic grading system: 3 grade system  The patient's subsequent history is as detailed below.   PAST MEDICAL HISTORY: Past Medical History:  Diagnosis Date   Allergies    Anxiety    Asthma    cough variant asthma developed in last couple of years   Bipolar 1 disorder (HCC)    Breast cancer (HCC) 02/12/2020   left lumpectomy IDC w/radiation no chemo   Cervical cancer screening 09/12/2016   Colon polyp 09/12/2016   Family history of bladder cancer    Family history of breast cancer    Family history of ovarian cancer    Family  history of prostate cancer    Hx of colonic polyp 09/12/2016   Colonoscopy 2017 with 1 small polyp, done by Dr Burnette, repeat colonoscopy in 5 years, 2022   Hypothyroidism    Personal history of radiation therapy    Preventative health care 07/23/2015   Thyroid  disease     PAST SURGICAL HISTORY: Past Surgical History:  Procedure Laterality Date   APPENDECTOMY     BREAST LUMPECTOMY WITH RADIOACTIVE SEED LOCALIZATION Bilateral 02/12/2020   Procedure: BILATERAL BREAST LUMPECTOMY WITH RADIOACTIVE SEED LOCALIZATION;  Surgeon: Curvin Deward MOULD, MD;  Location: [facility];  Service: General;  Laterality: Bilateral;   RE-EXCISION OF BREAST LUMPECTOMY Left 03/04/2020   Procedure: LEFT BREAST MARGIN REEXCISION;  Surgeon: Curvin Deward MOULD, MD;  Location: [facility];  Service: General;  Laterality: Left;   SENTINEL NODE BIOPSY Left 03/04/2020   Procedure: SENTINEL NODE BIOPSY;  Surgeon: Curvin Deward MOULD, MD;  Location: [facility];  Service: General;  Laterality: Left;   SKIN BIOPSY     back moles removed (precancer?)    FAMILY HISTORY: Family History  Problem Relation Age of Onset   Arthritis Mother    Dementia Mother        alzheimers   Cancer Father        bladder   Hypertension Father    Heart disease Father        MI, s/p triple bypass at age 27   Neuropathy Father        toxic peripheral    Other Father        peripheral neuropathy   Cholelithiasis Sister  Aortic aneurysm Sister 77   Aneurysm Sister        descending aortic    Mental retardation Sister        ADD, schizoaffective d/o   Obesity Sister    Depression Brother    Arthritis Brother        back disease   Prostate cancer Paternal Uncle        spread to bladder, d. 60s/70s   Dementia Maternal Grandmother    Osteoarthritis Maternal Grandmother    Macular degeneration Maternal Grandfather    Osteoarthritis Maternal Grandfather    Congestive Heart Failure Paternal Grandmother     Cancer Paternal Grandfather        young, lung cancer?, heavy smoker   Ovarian cancer Cousin 80       maternal second cousin (MGF's great-niece)   Breast cancer Cousin 26       paternal first cousin   Cancer Niece 11       soft tissue sarcoma  From the genetic counselors note (February 2022):  Ms. Sanford mother is alive at age 69 and may have had basal cell carcinoma of her face (unconfirmed). There were three maternal aunts. There is no known cancer among maternal aunts/uncles or maternal first cousins. Ms. Toste maternal grandmother died at age 21 without cancer. Her maternal grandfather died at age 91 without cancer. A maternal second cousin (MGF's great-niece) died from ovarian cancer at age 52. A maternal first cousin once removed (MGF's niece) had an unknown type of gynecologic cancer in her 49s.    Ms. Justo father is alive at age 5 and was diagnosed with bladder cancer at age 58. There are two paternal aunts and four paternal uncles. Once uncle died in his late 80s or early 75s with prostate cancer and bladder cancer. One paternal first cousin was diagnosed with breast cancer around age 58. Ms. Mahlum paternal grandmother died in her 41s without cancer. Her paternal grandfather died in his 106s from an unknown type of cancer (not colon cancer, possibly lung cancer). A paternal first cousin once removed (PGF's nephew) had lung cancer in his 68s, and died in his 49s with prostate cancer and bladder cancer.     GYNECOLOGIC HISTORY:   No LMP recorded. Patient is postmenopausal. Menarche: 58 years old Age at first live birth: 58 years old GX P 2 LMP age 72 Contraceptive  HRT yes at least 4 years  Hysterectomy? no BSO? 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  Her husband Madelin is an art gallery manager.  He takes care of our blood analyzer is here and throughout the system.  Son Emeline started forestry and lives in [place] week  where he works for [employer] restoration taking care of their trails.  Son Belvie is 73   ADVANCED DIRECTIVES: In the absence of any documents to the contrary the patient's husband is her healthcare power of attorney   HEALTH MAINTENANCE: Social History   Tobacco Use   Smoking status: Never   Smokeless tobacco: Never  Substance Use Topics   Alcohol use: Yes    Comment: social   Drug use: No     Colonoscopy: 02/2015, Dr. Burnette, recall 2022  PAP: 08/2016, negative  Bone density: Never done   No Active Allergies   Current Outpatient Medications  Medication Sig Dispense Refill   albuterol  (VENTOLIN  HFA) 108 (90 Base) MCG/ACT inhaler Inhale 2 puffs into the  lungs every 6 (six) hours as needed for wheezing or shortness of breath. 6.7 g 2   ASHWAGANDHA PO Take 2 tablets by mouth as needed (anxiety).     CALCIUM PO Take 2 tablets by mouth in the morning, at noon, and at bedtime. Bone Up     Cyanocobalamin  (VITAMIN B-12 PO) Take 1 capsule by mouth in the morning and at bedtime.     lithium  carbonate (LITHOBID ) 300 MG ER tablet Take 1 tablet (300 mg total) by mouth 2 (two) times daily. 60 tablet 5   Probiotic Product (PROBIOTIC-10 PO) Take 1 capsule by mouth at bedtime.     VITAMIN D PO Take 5,000 Units by mouth daily.     Zinc 50 MG TABS Take 50 mg by mouth daily.     No current facility-administered medications for this visit.    OBJECTIVE: White woman in no acute distress  Vitals:   11/30/23 1249  BP: 118/84  Pulse: 64  Resp: 18  Temp: 98.2 F (36.8 C)  SpO2: 100%       Body mass index is 25.02 kg/m.   Wt Readings from Last 3 Encounters:  11/30/23 155 lb (70.3 kg)  10/23/23 153 lb 2 oz (69.5 kg)  06/14/23 157 lb 6.4 oz (71.4 kg)      ECOG FS:1 - Symptomatic but completely ambulatory  Physical Exam Constitutional:      Appearance: Normal appearance.  Chest:     Comments: Bilateral breasts inspected.  Scattered density noted. No palpable masses No regional  adenopathy Musculoskeletal:     Cervical back: Normal range of motion and neck supple. No rigidity.  Lymphadenopathy:     Cervical: No cervical adenopathy.  Neurological:     Mental Status: She is alert.      LAB RESULTS:  CMP     Component Value Date/Time   NA 140 09/28/2022 0946   NA 143 04/21/2020 0843   K 4.6 09/28/2022 0946   CL 102 09/28/2022 0946   CO2 29 09/28/2022 0946   GLUCOSE 88 09/28/2022 0946   BUN 17 09/28/2022 0946   BUN 13 04/21/2020 0843   CREATININE 0.93 09/28/2022 0946   CREATININE 0.92 05/29/2022 1428   CALCIUM 9.6 09/28/2022 0946   PROT 7.1 09/28/2022 0946   ALBUMIN 4.2 09/28/2022 0946   AST 15 09/28/2022 0946   AST 14 (L) 05/29/2022 1428   ALT 12 09/28/2022 0946   ALT 12 05/29/2022 1428   ALKPHOS 64 09/28/2022 0946   BILITOT 0.7 09/28/2022 0946   BILITOT 0.8 05/29/2022 1428   GFRNONAA >60 05/29/2022 1428   GFRAA >60 06/25/2014 1948    No results found for: TOTALPROTELP, ALBUMINELP, A1GS, A2GS, BETS, BETA2SER, GAMS, MSPIKE, SPEI  Lab Results  Component Value Date   WBC 4.8 09/28/2022   NEUTROABS 2.8 09/28/2022   HGB 14.5 09/28/2022   HCT 44.5 09/28/2022   MCV 92.3 09/28/2022   PLT 166.0 09/28/2022    No results found for: LABCA2  No components found for: [id number]  No results for input(s): INR in the last 168 hours.  No results found for: LABCA2  No results found for: CAN199  No results found for: CAN125  No results found for: CAN153  No results found for: CA2729  No components found for: HGQUANT  No results found for: CEA1, CEA / No results found for: CEA1, CEA   No results found for: AFPTUMOR  No results found for: CHROMOGRNA  No results found for: KPAFRELGTCHN, LAMBDASER,  KAPLAMBRATIO (kappa/lambda light chains)  No results found for: HGBA, HGBA2QUANT, HGBFQUANT, HGBSQUAN (Hemoglobinopathy evaluation)   No results found for: LDH  No results found  for: IRON, TIBC, IRONPCTSAT (Iron and TIBC)  No results found for: FERRITIN  Urinalysis    Component Value Date/Time   COLORURINE YELLOW 06/25/2014 1850   APPEARANCEUR CLEAR 06/25/2014 1850   LABSPEC 1.029 06/25/2014 1850   PHURINE 5.5 06/25/2014 1850   GLUCOSEU NEGATIVE 06/25/2014 1850   HGBUR TRACE (A) 06/25/2014 1850   BILIRUBINUR NEGATIVE 06/25/2014 1850   KETONESUR NEGATIVE 06/25/2014 1850   PROTEINUR NEGATIVE 06/25/2014 1850   UROBILINOGEN 0.2 06/25/2014 1850   NITRITE NEGATIVE 06/25/2014 1850   LEUKOCYTESUR SMALL (A) 06/25/2014 1850    STUDIES: MM 3D DIAGNOSTIC MAMMOGRAM BILATERAL BREAST Result Date: 11/22/2023 CLINICAL DATA:  58 year old female status post left breast lumpectomy February 2022. EXAM: DIGITAL DIAGNOSTIC BILATERAL MAMMOGRAM WITH TOMOSYNTHESIS AND CAD TECHNIQUE: Bilateral digital diagnostic mammography and breast tomosynthesis was performed. The images were evaluated with computer-aided detection. COMPARISON:  Previous exam(s). ACR Breast Density Category c: The breasts are heterogeneously dense, which may obscure small masses. FINDINGS: Right mammogram:  No findings suspicious for malignancy. Left mammogram: Post lumpectomy changes. No findings suspicious for malignancy. IMPRESSION: Sequela of left breast lumpectomy, no mammographic evidence of malignancy. A result letter of this mammogram will be mailed directly to the patient. RECOMMENDATION: As the patient is now over 2 years out from her lumpectomy, she may return to annual screening mammography in 1 year. Given her history of breast cancer, she remains eligible for annual diagnostic mammography, if preferred. I have discussed the findings and recommendations with the patient. If applicable, a reminder letter will be sent to the patient regarding the next appointment. BI-RADS CATEGORY  2: Benign. Electronically Signed   By: Curtistine Noble   On: 11/22/2023 15:17     ELIGIBLE FOR AVAILABLE RESEARCH  PROTOCOL: no  ASSESSMENT: 58 y.o. [facility] woman status post bilateral lumpectomies 02/12/2020, showing  (a) in the right breast, an intraductal papilloma and atypical lobular hyperplasia  (b) in the left breast, a pT1a pNX, stage IA invasive ductal carcinoma, grade 2, with positive margins   (i) the invasive disease was estrogen and progesterone  receptor positive, HER-2 not amplified, with an MIB-1 of 2%   (ii) additional surgery 03/04/2020 clear margins   (iii) left axillary sentinel lymph node sampling 03/04/2020 removed 2 lymph nodes both negative  (1) adjuvant radiation completed 04/30/2020  (2) genetics testing 04/02/2020 through the [facility]Expanded +RNAinsight Panel found no deleterious mutations in AIP, ALK, APC, ATM, AXIN2, BAP1, BARD1, BLM, BMPR1A, BRCA1, BRCA2, BRIP1, CDC73, CDH1, CDK4, CDKN1B, CDKN2A, CHEK2, CTNNA1, DICER1, FANCC, FH, FLCN, GALNT12, KIF1B, LZTR1, MAX, MEN1, MET, MLH1, MSH2, MSH3, MSH6, MUTYH, NBN, NF1, NF2, NTHL1, PALB2, PHOX2B, PMS2, POT1, PRKAR1A, PTCH1, PTEN, RAD51C, RAD51D, RB1, RECQL, RET, SDHA, SDHAF2, SDHB, SDHC, SDHD, SMAD4, SMARCA4, SMARCB1, SMARCE1, STK11, SUFU, TMEM127, TP53, TSC1, TSC2, VHL and XRCC2 (sequencing and deletion/duplication); EGFR, EGLN1, HOXB13, KIT, MITF, PDGFRA, POLD1, and POLE (sequencing only); EPCAM and GREM1 (deletion/duplication only).    PLAN:  Assessment and Plan Assessment & Plan Surveillance for history of breast cancer Mammogram results clear. Breast density decreased to category C, improving interpretation. Discussed CEM as a more sensitive and specific alternative to MRI,  -  CEM recommended biennially, alternating with regular mammograms. - Continue annual mammograms. - Consider CEM next year as an alternative to MRI.  Hashimoto's thyroiditis with hypothyroidism TSH decreased from 10 to 4 after  switching to [facility] thyroid . T4 and free T4 levels normal. Integrative doctor recommended continuing Armour thyroid . -  Continue [facility] thyroid . - Follow up with integrative doctor in April for TSH check.  Weight gain and difficulty losing weight Difficulty losing weight despite exercise and diet.   Encouraged to continue exercise regimen and good dietary practices.  Total time spent: 30 minutes. *Total Encounter Time as defined by the Centers for Medicare and Medicaid Services includes, in addition to the face-to-face time of a patient visit (documented in the note above) non-face-to-face time: obtaining and reviewing outside history, ordering and reviewing medications, tests or procedures, care coordination (communications with other health care professionals or caregivers) and documentation in the medical record.

## 2023-12-05 ENCOUNTER — Telehealth: Payer: Self-pay

## 2023-12-05 NOTE — Telephone Encounter (Signed)
-----   Message from Winton Iruku sent at 11/30/2023  5:59 PM EST ----- Ordered a CEM to be done at [facility], FYI.

## 2023-12-05 NOTE — Telephone Encounter (Signed)
 Email sent to [employer] and Powell Molt at [facility].

## 2024-04-23 ENCOUNTER — Ambulatory Visit: Attending: General Surgery

## 2024-06-26 ENCOUNTER — Encounter: Admitting: Family Medicine
